# Patient Record
Sex: Female | Born: 1935 | Race: White | Hispanic: No | Smoking: Former smoker
Health system: Southern US, Community
[De-identification: ages and names within clinical notes are randomized; demographics above are authoritative.]

## PROBLEM LIST (undated history)

## (undated) DIAGNOSIS — H9312 Tinnitus, left ear: Secondary | ICD-10-CM

## (undated) DIAGNOSIS — Z7901 Long term (current) use of anticoagulants: Secondary | ICD-10-CM

## (undated) DIAGNOSIS — R55 Syncope and collapse: Secondary | ICD-10-CM

## (undated) DIAGNOSIS — D649 Anemia, unspecified: Secondary | ICD-10-CM

## (undated) DIAGNOSIS — H8110 Benign paroxysmal vertigo, unspecified ear: Secondary | ICD-10-CM

## (undated) DIAGNOSIS — I480 Paroxysmal atrial fibrillation: Secondary | ICD-10-CM

## (undated) DIAGNOSIS — F329 Major depressive disorder, single episode, unspecified: Secondary | ICD-10-CM

## (undated) DIAGNOSIS — I10 Essential (primary) hypertension: Secondary | ICD-10-CM

## (undated) DIAGNOSIS — E119 Type 2 diabetes mellitus without complications: Secondary | ICD-10-CM

## (undated) DIAGNOSIS — Z9289 Personal history of other medical treatment: Secondary | ICD-10-CM

## (undated) DIAGNOSIS — J449 Chronic obstructive pulmonary disease, unspecified: Secondary | ICD-10-CM

## (undated) DIAGNOSIS — IMO0001 Reserved for inherently not codable concepts without codable children: Secondary | ICD-10-CM

## (undated) DIAGNOSIS — F411 Generalized anxiety disorder: Secondary | ICD-10-CM

## (undated) DIAGNOSIS — E7800 Pure hypercholesterolemia, unspecified: Secondary | ICD-10-CM

## (undated) DIAGNOSIS — R0609 Other forms of dyspnea: Secondary | ICD-10-CM

## (undated) DIAGNOSIS — R0600 Dyspnea, unspecified: Secondary | ICD-10-CM

## (undated) DIAGNOSIS — K5900 Constipation, unspecified: Secondary | ICD-10-CM

## (undated) DIAGNOSIS — M1990 Unspecified osteoarthritis, unspecified site: Secondary | ICD-10-CM

## (undated) DIAGNOSIS — R32 Unspecified urinary incontinence: Secondary | ICD-10-CM

## (undated) DIAGNOSIS — F32A Depression, unspecified: Secondary | ICD-10-CM

## (undated) DIAGNOSIS — G4700 Insomnia, unspecified: Secondary | ICD-10-CM

## (undated) DIAGNOSIS — F4381 Prolonged grief disorder: Secondary | ICD-10-CM

## (undated) DIAGNOSIS — F4329 Adjustment disorder with other symptoms: Secondary | ICD-10-CM

## (undated) HISTORY — DX: Major depressive disorder, single episode, unspecified: F32.9

## (undated) HISTORY — DX: Essential (primary) hypertension: I10

## (undated) HISTORY — DX: Generalized anxiety disorder: F41.1

## (undated) HISTORY — DX: Unspecified osteoarthritis, unspecified site: M19.90

## (undated) HISTORY — DX: Pure hypercholesterolemia, unspecified: E78.00

## (undated) HISTORY — DX: Prolonged grief disorder: F43.81

## (undated) HISTORY — DX: Adjustment disorder with other symptoms: F43.29

## (undated) HISTORY — DX: Tinnitus, left ear: H93.12

## (undated) HISTORY — DX: Depression, unspecified: F32.A

## (undated) HISTORY — DX: Long term (current) use of anticoagulants: Z79.01

## (undated) HISTORY — DX: Insomnia, unspecified: G47.00

## (undated) HISTORY — DX: Constipation, unspecified: K59.00

## (undated) HISTORY — DX: Benign paroxysmal vertigo, unspecified ear: H81.10

---
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
## 1972-04-23 HISTORY — PX: TUBAL LIGATION: SHX77

## 1982-04-23 HISTORY — PX: CHOLECYSTECTOMY: SHX55

## 1998-07-14 ENCOUNTER — Other Ambulatory Visit: Admission: RE | Admit: 1998-07-14 | Discharge: 1998-07-14 | Payer: Self-pay | Admitting: Gynecology

## 1999-11-21 ENCOUNTER — Encounter (INDEPENDENT_AMBULATORY_CARE_PROVIDER_SITE_OTHER): Payer: Self-pay

## 1999-11-21 ENCOUNTER — Ambulatory Visit (HOSPITAL_COMMUNITY): Admission: RE | Admit: 1999-11-21 | Discharge: 1999-11-21 | Payer: Self-pay | Admitting: Gynecology

## 1999-11-21 HISTORY — PX: POLYPECTOMY: SHX149

## 2000-08-06 ENCOUNTER — Other Ambulatory Visit: Admission: RE | Admit: 2000-08-06 | Discharge: 2000-08-06 | Payer: Self-pay | Admitting: Gynecology

## 2001-08-18 ENCOUNTER — Other Ambulatory Visit: Admission: RE | Admit: 2001-08-18 | Discharge: 2001-08-18 | Payer: Self-pay | Admitting: Gynecology

## 2002-09-10 ENCOUNTER — Other Ambulatory Visit: Admission: RE | Admit: 2002-09-10 | Discharge: 2002-09-10 | Payer: Self-pay | Admitting: Gynecology

## 2003-04-27 ENCOUNTER — Ambulatory Visit (HOSPITAL_COMMUNITY): Admission: RE | Admit: 2003-04-27 | Discharge: 2003-04-27 | Payer: Self-pay | Admitting: Gastroenterology

## 2003-04-27 ENCOUNTER — Encounter (INDEPENDENT_AMBULATORY_CARE_PROVIDER_SITE_OTHER): Payer: Self-pay | Admitting: *Deleted

## 2003-09-29 ENCOUNTER — Other Ambulatory Visit: Admission: RE | Admit: 2003-09-29 | Discharge: 2003-09-29 | Payer: Self-pay | Admitting: Gynecology

## 2004-03-22 ENCOUNTER — Ambulatory Visit (HOSPITAL_COMMUNITY): Admission: RE | Admit: 2004-03-22 | Discharge: 2004-03-22 | Payer: Self-pay | Admitting: Neurosurgery

## 2004-10-06 ENCOUNTER — Ambulatory Visit (HOSPITAL_COMMUNITY): Admission: RE | Admit: 2004-10-06 | Discharge: 2004-10-06 | Payer: Self-pay | Admitting: Internal Medicine

## 2004-10-16 ENCOUNTER — Other Ambulatory Visit: Admission: RE | Admit: 2004-10-16 | Discharge: 2004-10-16 | Payer: Self-pay | Admitting: Gynecology

## 2004-11-21 HISTORY — PX: EYE SURGERY: SHX253

## 2005-11-20 ENCOUNTER — Other Ambulatory Visit: Admission: RE | Admit: 2005-11-20 | Discharge: 2005-11-20 | Payer: Self-pay | Admitting: Gynecology

## 2006-03-23 HISTORY — PX: EYE SURGERY: SHX253

## 2007-01-21 ENCOUNTER — Other Ambulatory Visit: Admission: RE | Admit: 2007-01-21 | Discharge: 2007-01-21 | Payer: Self-pay | Admitting: Gynecology

## 2009-04-01 ENCOUNTER — Emergency Department (HOSPITAL_COMMUNITY): Admission: EM | Admit: 2009-04-01 | Discharge: 2009-04-02 | Payer: Self-pay | Admitting: Emergency Medicine

## 2009-04-21 ENCOUNTER — Encounter: Admission: RE | Admit: 2009-04-21 | Discharge: 2009-04-21 | Payer: Self-pay | Admitting: Specialist

## 2009-04-23 HISTORY — PX: CERVICAL DISC SURGERY: SHX588

## 2009-04-28 ENCOUNTER — Inpatient Hospital Stay (HOSPITAL_COMMUNITY)
Admission: RE | Admit: 2009-04-28 | Discharge: 2009-04-29 | Payer: Self-pay | Source: Home / Self Care | Admitting: Specialist

## 2009-07-04 ENCOUNTER — Ambulatory Visit: Admission: RE | Admit: 2009-07-04 | Discharge: 2009-07-04 | Payer: Self-pay | Admitting: Internal Medicine

## 2009-07-04 ENCOUNTER — Encounter: Payer: Self-pay | Admitting: Internal Medicine

## 2009-07-07 ENCOUNTER — Emergency Department (HOSPITAL_COMMUNITY): Admission: EM | Admit: 2009-07-07 | Discharge: 2009-07-07 | Payer: Self-pay | Admitting: Emergency Medicine

## 2009-07-25 DIAGNOSIS — F411 Generalized anxiety disorder: Secondary | ICD-10-CM

## 2009-07-25 DIAGNOSIS — R0602 Shortness of breath: Secondary | ICD-10-CM

## 2009-07-25 DIAGNOSIS — I4891 Unspecified atrial fibrillation: Secondary | ICD-10-CM | POA: Insufficient documentation

## 2009-07-25 DIAGNOSIS — I10 Essential (primary) hypertension: Secondary | ICD-10-CM | POA: Insufficient documentation

## 2009-07-25 DIAGNOSIS — E119 Type 2 diabetes mellitus without complications: Secondary | ICD-10-CM

## 2009-07-26 ENCOUNTER — Ambulatory Visit: Payer: Self-pay | Admitting: Internal Medicine

## 2009-07-26 DIAGNOSIS — J309 Allergic rhinitis, unspecified: Secondary | ICD-10-CM | POA: Insufficient documentation

## 2009-07-27 LAB — CONVERTED CEMR LAB
BUN: 12 mg/dL (ref 6–23)
Basophils Absolute: 0.2 10*3/uL — ABNORMAL HIGH (ref 0.0–0.1)
Basophils Relative: 1.8 % (ref 0.0–3.0)
CO2: 30 meq/L (ref 19–32)
Calcium: 10.2 mg/dL (ref 8.4–10.5)
Chloride: 103 meq/L (ref 96–112)
Creatinine, Ser: 0.8 mg/dL (ref 0.4–1.2)
Eosinophils Absolute: 0.2 10*3/uL (ref 0.0–0.7)
Eosinophils Relative: 1.8 % (ref 0.0–5.0)
Glucose, Bld: 150 mg/dL — ABNORMAL HIGH (ref 70–99)
HCT: 40.7 % (ref 36.0–46.0)
Hemoglobin: 13.8 g/dL (ref 12.0–15.0)
Lymphocytes Relative: 20 % (ref 12.0–46.0)
Lymphs Abs: 1.8 10*3/uL (ref 0.7–4.0)
MCHC: 33.8 g/dL (ref 30.0–36.0)
MCV: 90.9 fL (ref 78.0–100.0)
Monocytes Relative: 8.8 % (ref 3.0–12.0)
Neutrophils Relative %: 67.6 % (ref 43.0–77.0)
Platelets: 289 10*3/uL (ref 150.0–400.0)
Pro B Natriuretic peptide (BNP): 117 pg/mL — ABNORMAL HIGH (ref 0.0–100.0)
RBC: 4.48 M/uL (ref 3.87–5.11)
RDW: 12.3 % (ref 11.5–14.6)
Sed Rate: 17 mm/hr (ref 0–22)
Sodium: 142 meq/L (ref 135–145)
TSH: 1.31 microintl units/mL (ref 0.35–5.50)

## 2009-08-11 ENCOUNTER — Ambulatory Visit: Payer: Self-pay | Admitting: Internal Medicine

## 2010-05-14 ENCOUNTER — Encounter: Payer: Self-pay | Admitting: Internal Medicine

## 2010-05-23 NOTE — Assessment & Plan Note (Signed)
Summary: Pulmonary/ final summary f/u ov   Copy to:  Dr. Murray Hodgkins Primary Provider/Referring Provider:  Dr. Murray Hodgkins   CC:  2 wk followup.  Pt states that her breathing has improved 100%.  No complaints today.Erin Vang  History of Present Illness: 75 yowf quit smoking 1988 with no respiratory symptoms.  July 26, 2009 cc sob x sev months doe gradually seemed to happen after neck surgery by Elliot Gurney  Jan 2011 noticed got back on her feet could no longer walk at  [employer] and now using handicap parking. mild dry cough, mild hoarseness. PFT's showed minimal copd   Imp was uacs, rec  Start Dexilant 60 mg Take  one 30-60 min before first meal of the day and  stop all oil based vitamins and do paced excercise.   August 11, 2009 2 wk followup.  Pt states that her breathing has improved 100%.  No complaints today. Pt also denies any obvious fluctuation in symptoms with weather or environmental change or other alleviating or aggravating factors.       Current Medications (verified): 1)  Lanoxin 0.125 Mg Tabs (Digoxin) .Erin Vang.. 1 Every Am 2)  Metoprolol Tartrate 50 Mg Tabs (Metoprolol Tartrate) .Erin Vang.. 1 Two Times A Day 3)  Metformin Hcl 500 Mg Tabs (Metformin Hcl) .Erin Vang.. 1 Two Times A Day 4)  Glimepiride 1 Mg Tabs (Glimepiride) .Erin Vang.. 1 Once Daily 5)  Aspirin 81 Mg Tbec (Aspirin) .Erin Vang.. 1 Once Daily 6)  Multivitamins  Tabs (Multiple Vitamin) .Erin Vang.. 1 Once Daily 7)  Caltrate 600+d Plus 600-400 Mg-Unit Tabs (Calcium Carbonate-Vit D-Min) .Erin Vang.. 1 Two Times A Day 8)  Glucosamine 500 Mg Caps (Glucosamine Sulfate) .... 3 Once Daily 9)  Vitamin C 1000 Mg Tabs (Ascorbic Acid) .Erin Vang.. 1 Once Daily 10)  Xanax 0.5 Mg Tabs (Alprazolam) .Erin Vang.. 1 Once Daily As Needed  Allergies (verified): 1)  ! Erythromycin 2)  ! Macrobid 3)  ! Neosporin 4)  ! Sulfa  Past History:  Past Medical History: DIABETES MELLITUS, TYPE II (ICD-250.00) ANXIETY (ICD-300.00) ATRIAL FIBRILLATION (ICD-427.31) DYSPNEA (ICD-786.05)     - PFT's 07/04/09  FEV1 2.25 (70%) ratio 70 with 10% improvement and DLCO 46 > 64%     - No desat walking x 2 laps @  185 ft each July 26, 2009  Allergic Rhinitis  Vital Signs:  Patient profile:   75 year old female Weight:      149 pounds O2 Sat:      98 % on Room air Temp:     97.9 degrees F oral Pulse rate:   74 / minute BP sitting:   140 / 82  (left arm)  Vitals Entered By: Vernie Murders (August 11, 2009 11:49 AM)  O2 Flow:  Room air  Physical Exam  Additional Exam:   Pleasant amb wf nad with classic voice fatigue > resolved completely HEENT: upper dentures, turbinates, and orophanx. Nl external ear canals without cough reflex NECK :  without JVD/Nodes/TM/ nl carotid upstrokes bilaterally LUNGS: no acc muscle use, clear to A and P bilaterally without cough on insp or exp maneuvers CV:  RRR  no s3 or murmur or increase in P2, no edema  ABD:  soft and nontender with nl excursion in the supine position. No bruits or organomegaly, bowel sounds nl MS:  warm without deformities, calf tenderness, cyanosis or clubbing      Impression & Recommendations:  Problem # 1:  DYSPNEA (ICD-786.05)  When respiratory symptoms begin well after a  patient reports complete smoking cessation,  it is very hard to "blame" COPD  ie it doesn't make any more sense than hearing a  NASCAR driver wrecked his car while driving his kids to school or a Careers adviser sliced his hand off carving [place].  Once the high risk activity stops,  the symptoms should not suddenly erupt.  If so, the differential diagnosis should include  obesity/deconditioning,  LPR/Reflux, CHF, or side effect of medications   In her case the problem was probably a combination of LPR/gerd and deconditioning.  Ok to try to taper PPI as long as follows diet.  NB the  ramp to expected improvement (and for that matter, worsening, if a chronic effective medication is stopped)  can be measured in weeks, not days, a common misconception because this is not Heartburn  with no immediate cause and effect relationship so that response to therapy or lack thereof can be very difficult to assess.   Each maintenance medication was reviewed in detail including most importantly the difference between maintenance prns and under what circumstances the prns are to be used.  In addition, these two groups (for which the patient should keep up with refills) were distinguished from a third group :  meds that are used only short term with the intent to complete a course of therapy and then not refill them.  The med list was then fully reconciled and reorganized to reflect this important distinction.   Other Orders: Est. Patient Level IV (16109)  Patient Instructions: 1)  ok to try off dexilant as long as you follow the diet and if worsen will need to restart dexilant or try prilosec otc 20 mg take Take  two  30-60 min before first meal of the day

## 2010-05-23 NOTE — Assessment & Plan Note (Signed)
Summary: Pulmonary/ new pt eval for doe   Visit Type:  Initial Consult Copy to:  Dr. Murray Hodgkins Primary Provider/Referring Provider:  Dr. Murray Hodgkins   CC:  Dyspnea.  History of Present Illness: 74 yowf quit smoking 1988 with no respiratory symptoms.  July 26, 2009 cc sob x sev months doe gradually seemed to happen after neck surgery by Elliot Gurney  Jan 2011 noticed got back on her feet could no longer walk at  [employer] and now using handicap parking. mild dry cough, mild hoarseness.  Pt denies any significant sore throat, dysphagia, itching, sneezing,  nasal congestion or excess secretions,  fever, chills, sweats, unintended wt loss, pleuritic or exertional cp, hempoptysis, change in activity tolerance  orthopnea pnd or leg swelling. Pt also denies any obvious fluctuation in symptoms with weather or environmental change or other alleviating or aggravating factors.       Current Medications (verified): 1)  Lanoxin 0.125 Mg Tabs (Digoxin) .Marland Kitchen.. 1 Every Am 2)  Metoprolol Tartrate 50 Mg Tabs (Metoprolol Tartrate) .Marland Kitchen.. 1 Two Times A Day 3)  Metformin Hcl 500 Mg Tabs (Metformin Hcl) .Marland Kitchen.. 1 Two Times A Day 4)  Glimepiride 1 Mg Tabs (Glimepiride) .Marland Kitchen.. 1 Once Daily 5)  Aspirin 81 Mg Tbec (Aspirin) .Marland Kitchen.. 1 Once Daily 6)  Multivitamins  Tabs (Multiple Vitamin) .Marland Kitchen.. 1 Once Daily 7)  Caltrate 600+d Plus 600-400 Mg-Unit Tabs (Calcium Carbonate-Vit D-Min) .Marland Kitchen.. 1 Two Times A Day 8)  Vitamin D3 2000 Unit Caps (Cholecalciferol) .Marland Kitchen.. 1 Once Daily 9)  Fish Oil Maximum Strength 1200 Mg Caps (Omega-3 Fatty Acids) .Marland Kitchen.. 1 Once Daily 10)  Flax Seed Oil 1000 Mg Caps (Flaxseed (Linseed)) .Marland Kitchen.. 1 Once Daily 11)  Glucosamine 500 Mg Caps (Glucosamine Sulfate) .... 3 Once Daily 12)  Vitamin C 1000 Mg Tabs (Ascorbic Acid) .Marland Kitchen.. 1 Once Daily 13)  Xanax 0.5 Mg Tabs (Alprazolam) .Marland Kitchen.. 1 Once Daily As Needed  Allergies (verified): 1)  ! Erythromycin 2)  ! Macrobid 3)  ! Neosporin 4)  ! Sulfa  Past History:  Past  Medical History: DIABETES MELLITUS, TYPE II (ICD-250.00) ANXIETY (ICD-300.00) ATRIAL FIBRILLATION (ICD-427.31) DYSPNEA (ICD-786.05)     - PFT's 07/04/09 FEV1 2.25 (70%) ratio 70 with 10% improvement and DLCO 46 > 64%     - No desat walking x 2 laps @ 185 ft each July 26, 2009  Allergic Rhinitis  Family History: Lung CA- Father (was a smoker)  Social History: Married Children Former smoker.  Quit in 1988.  Smoked for 35 yrs up to 1 1/2 ppd No ETOH Retired  Review of Systems       The patient complains of shortness of breath with activity, non-productive cough, loss of appetite, and weight change.  The patient denies shortness of breath at rest, productive cough, coughing up blood, chest pain, irregular heartbeats, acid heartburn, indigestion, abdominal pain, difficulty swallowing, sore throat, tooth/dental problems, headaches, nasal congestion/difficulty breathing through nose, sneezing, itching, ear ache, anxiety, depression, hand/feet swelling, joint stiffness or pain, rash, change in color of mucus, and fever.    Vital Signs:  Patient profile:   75 year old female Height:      64 inches Weight:      127 pounds BMI:     21.88 O2 Sat:      97 % on Room air Temp:     98.3 degrees F oral Pulse rate:   74 / minute BP sitting:   122 / 60  (left arm)  Vitals Entered By: Vernie Murders (July 26, 2009 10:04 AM)  O2 Flow:  Room air  Serial Vital Signs/Assessments:  Comments: 10:32 AM Ambulatory Pulse Oximetry  Resting; HR__66___    02 Sat__97%ra___  Lap1 (185 feet)   HR__75___   02 Sat__97%ra___ Lap2 (185 feet)   HR__75___   02 Sat__97%ra___    Lap3 (185 feet)   HR_____   02 Sat_____  ___Test Completed without Difficulty _x__Test Stopped due to:pt c/o SOB   By: Vernie Murders    Physical Exam  Additional Exam:  wt 127 July 26, 2009 Pleasant amb wf nad with classic voice fatigue HEENT: upper dentures, turbinates, and orophanx. Nl external ear canals without cough  reflex NECK :  without JVD/Nodes/TM/ nl carotid upstrokes bilaterally LUNGS: no acc muscle use, clear to A and P bilaterally without cough on insp or exp maneuvers CV:  RRR  no s3 or murmur or increase in P2, no edema  ABD:  soft and nontender with nl excursion in the supine position. No bruits or organomegaly, bowel sounds nl MS:  warm without deformities, calf tenderness, cyanosis or clubbing SKIN: warm and dry without lesions   NEURO:  alert, approp, no deficits     CT of Chest  Procedure date:  07/04/2009  Findings:      biapical densities c/w scarring no change since 04/29/09 and no active or acute cardiopulmonary abns  Impression & Recommendations:  Problem # 1:  DYSPNEA (ICD-786.05) When respiratory symptoms begin well after a patient reports complete smoking cessation,  it is very hard to "blame" COPD  ie it doesn't make any more sense than hearing a  NASCAR driver wrecked his car while driving his kids to school or a Careers adviser sliced his hand off carving [place].  Once the high risk activity stops,  the symptoms should not suddenly erupt.  If so, the differential diagnosis should include  obesity/deconditioning,  LPR/Reflux, CHF, or side effect of medications.  Not on ace and only moderate doses of metaprolol but coulld have occult LPR aggravated by ET placement for surgery explaining her cough,voice fatigue and atypical sob.  try diet and dexilant and reconditioning and off all oil based vitamins empirically in case she has non-acid related occult gerd  NB the  ramp to expected improvement (and for that matter, worsening, if a chronic effective medication is stopped)  can be measured in weeks, not days, a common misconception because this is not Heartburn with no immediate cause and effect relationship so that response to therapy or lack thereof can be very difficult to assess.   See instructions for specific recommendations    Medications Added to Medication List This Visit: 1)   Lanoxin 0.125 Mg Tabs (Digoxin) .Marland Kitchen.. 1 every am 2)  Metoprolol Tartrate 50 Mg Tabs (Metoprolol tartrate) .Marland Kitchen.. 1 two times a day 3)  Metformin Hcl 500 Mg Tabs (Metformin hcl) .Marland Kitchen.. 1 two times a day 4)  Glimepiride 1 Mg Tabs (Glimepiride) .Marland Kitchen.. 1 once daily 5)  Aspirin 81 Mg Tbec (Aspirin) .Marland Kitchen.. 1 once daily 6)  Multivitamins Tabs (Multiple vitamin) .Marland Kitchen.. 1 once daily 7)  Caltrate 600+d Plus 600-400 Mg-unit Tabs (Calcium carbonate-vit d-min) .Marland Kitchen.. 1 two times a day 8)  Vitamin D3 2000 Unit Caps (Cholecalciferol) .Marland Kitchen.. 1 once daily 9)  Fish Oil Maximum Strength 1200 Mg Caps (Omega-3 fatty acids) .Marland Kitchen.. 1 once daily 10)  Flax Seed Oil 1000 Mg Caps (Flaxseed (linseed)) .Marland Kitchen.. 1 once daily 11)  Glucosamine 500 Mg Caps (  Glucosamine sulfate) .... 3 once daily 12)  Vitamin C 1000 Mg Tabs (Ascorbic acid) .Marland Kitchen.. 1 once daily 13)  Xanax 0.5 Mg Tabs (Alprazolam) .Marland Kitchen.. 1 once daily as needed 14)  Dexilant 60 Mg Cpdr (Dexlansoprazole) .... Take  one 30-60 min before first meal of the day 15)  Dexilant 60 Mg Cpdr (Dexlansoprazole) .... Take  one 30-60 min before first meal of the day  Other Orders: TLB-BMP (Basic Metabolic Panel-BMET) (80048-METABOL) TLB-CBC Platelet - w/Differential (85025-CBCD) TLB-TSH (Thyroid Stimulating Hormone) (84443-TSH) TLB-BNP (B-Natriuretic Peptide) (83880-BNPR) TLB-Sedimentation Rate (ESR) (85652-ESR) New Patient Level V (16109)  Patient Instructions: 1)  Pace yourself on a flat surface and try to build up to 20 - 30 min daily never to point of being out of breath 2)  Start Dexilant 60 mg Take  one 30-60 min before first meal of the day and  stop all oil based vitamins 3)  GERD (REFLUX)  is a common cause of respiratory symptoms. It commonly presents without heartburn and can be treated with medication, but also with lifestyle changes including avoidance of late meals, excessive alcohol, smoking cessation, and avoid fatty foods, chocolate, peppermint, colas, red wine, and acidic juices  such as orange juice. NO MINT OR MENTHOL PRODUCTS SO NO COUGH DROPS  4)  USE SUGARLESS CANDY INSTEAD (jolley ranchers)  5)  ADD:  NEEDS 2 week f/u ov

## 2010-07-04 ENCOUNTER — Ambulatory Visit: Payer: Self-pay | Admitting: Cardiology

## 2010-07-06 ENCOUNTER — Ambulatory Visit (INDEPENDENT_AMBULATORY_CARE_PROVIDER_SITE_OTHER): Payer: Medicare Other | Admitting: Cardiology

## 2010-07-06 DIAGNOSIS — R0602 Shortness of breath: Secondary | ICD-10-CM

## 2010-07-06 DIAGNOSIS — I119 Hypertensive heart disease without heart failure: Secondary | ICD-10-CM

## 2010-07-06 DIAGNOSIS — Z79899 Other long term (current) drug therapy: Secondary | ICD-10-CM

## 2010-07-09 LAB — CBC
HCT: 41.6 % (ref 36.0–46.0)
Hemoglobin: 14.4 g/dL (ref 12.0–15.0)
MCHC: 34.6 g/dL (ref 30.0–36.0)
MCV: 93.4 fL (ref 78.0–100.0)
Platelets: 219 10*3/uL (ref 150–400)
RBC: 4.45 MIL/uL (ref 3.87–5.11)
RDW: 13.5 % (ref 11.5–15.5)
WBC: 8.2 10*3/uL (ref 4.0–10.5)

## 2010-07-09 LAB — DIFFERENTIAL
Basophils Absolute: 0.1 10*3/uL (ref 0.0–0.1)
Basophils Relative: 2 % — ABNORMAL HIGH (ref 0–1)
Eosinophils Absolute: 0.1 10*3/uL (ref 0.0–0.7)
Eosinophils Relative: 1 % (ref 0–5)
Lymphocytes Relative: 27 % (ref 12–46)
Lymphs Abs: 2.2 10*3/uL (ref 0.7–4.0)
Monocytes Absolute: 0.9 10*3/uL (ref 0.1–1.0)
Monocytes Relative: 11 % (ref 3–12)
Neutro Abs: 4.9 10*3/uL (ref 1.7–7.7)
Neutrophils Relative %: 60 % (ref 43–77)

## 2010-07-09 LAB — COMPREHENSIVE METABOLIC PANEL
ALT: 30 U/L (ref 0–35)
AST: 36 U/L (ref 0–37)
Albumin: 3.7 g/dL (ref 3.5–5.2)
Alkaline Phosphatase: 57 U/L (ref 39–117)
BUN: 12 mg/dL (ref 6–23)
CO2: 25 mEq/L (ref 19–32)
Calcium: 9.1 mg/dL (ref 8.4–10.5)
Chloride: 104 mEq/L (ref 96–112)
Creatinine, Ser: 0.97 mg/dL (ref 0.4–1.2)
GFR calc Af Amer: >60 mL/min (ref >60)
GFR calc non Af Amer: 57 mL/min — ABNORMAL LOW (ref >60)
Glucose, Bld: 153 mg/dL — ABNORMAL HIGH (ref 70–99)
Potassium: 4.1 mEq/L (ref 3.5–5.1)
Sodium: 139 mEq/L (ref 135–145)
Total Bilirubin: 0.5 mg/dL (ref 0.3–1.2)
Total Protein: 6.4 g/dL (ref 6.0–8.3)

## 2010-07-09 LAB — GLUCOSE, CAPILLARY
Glucose-Capillary: 118 mg/dL — ABNORMAL HIGH (ref 70–99)
Glucose-Capillary: 137 mg/dL — ABNORMAL HIGH (ref 70–99)
Glucose-Capillary: 139 mg/dL — ABNORMAL HIGH (ref 70–99)
Glucose-Capillary: 93 mg/dL (ref 70–99)

## 2010-07-09 LAB — APTT: aPTT: 25 seconds (ref 24–37)

## 2010-07-09 LAB — HEMOGLOBIN A1C
Hgb A1c MFr Bld: 7.5 % — ABNORMAL HIGH (ref 4.6–6.1)
Mean Plasma Glucose: 169 mg/dL

## 2010-07-09 LAB — PROTIME-INR
INR: 1.02 (ref 0.00–1.49)
Prothrombin Time: 13.3 seconds (ref 11.6–15.2)

## 2010-07-14 ENCOUNTER — Telehealth: Payer: Self-pay | Admitting: *Deleted

## 2010-07-14 DIAGNOSIS — R609 Edema, unspecified: Secondary | ICD-10-CM

## 2010-07-14 LAB — BLOOD GAS, ARTERIAL
Acid-Base Excess: 0.9 mmol/L (ref 0.0–2.0)
Bicarbonate: 24.2 meq/L — ABNORMAL HIGH (ref 20.0–24.0)
Drawn by: 246101
FIO2: 0.21 %
O2 Saturation: 96.2 %
Patient temperature: 98.6
TCO2: 21.5 mmol/L (ref 0–100)
pCO2 arterial: 36 mmHg (ref 35.0–45.0)
pH, Arterial: 7.442 — ABNORMAL HIGH (ref 7.350–7.400)
pO2, Arterial: 77.8 mmHg — ABNORMAL LOW (ref 80.0–100.0)

## 2010-07-14 MED ORDER — FUROSEMIDE 40 MG PO TABS: ORAL_TABLET | ORAL | Status: DC

## 2010-07-14 NOTE — Telephone Encounter (Signed)
Stop Losartan and start lasix 40 1-2 tabs daily prn

## 2010-07-14 NOTE — Telephone Encounter (Signed)
Started swelling in legs and even in hands, unable to get rings on.  Swelling started yesterday, did go down last night.  Has been sitting with feet up.  Husband in hospital.  Started losartan 100 mg on 07/07/10, coming from that.  No new shortness of breath.  Denies any increase salt intake.  Please advise.

## 2010-07-14 NOTE — Telephone Encounter (Signed)
Message copied by Regis Bill on Fri Jul 14, 2010 10:35 AM ------      Message from: Sheffield Slider      Created: Fri Jul 14, 2010  9:29 AM      Regarding: SWELLING      Contact: [phone]       PT HAS HAD SOME RECENT SWELLING, PLEASE CALL ABOVE # LISTED. CHART IN BOX.  929AM

## 2010-07-14 NOTE — Telephone Encounter (Signed)
Advised pt to stop losartan and start lasix prn.  Advised to call back next week with update on how she is doing

## 2010-07-20 ENCOUNTER — Other Ambulatory Visit (INDEPENDENT_AMBULATORY_CARE_PROVIDER_SITE_OTHER): Payer: Medicare Other | Admitting: *Deleted

## 2010-07-20 DIAGNOSIS — Z79899 Other long term (current) drug therapy: Secondary | ICD-10-CM

## 2010-07-20 LAB — BASIC METABOLIC PANEL
BUN: 14 mg/dL (ref 6–23)
Calcium: 9.9 mg/dL (ref 8.4–10.5)
GFR: 75.82 mL/min (ref >60.00)
Glucose, Bld: 144 mg/dL — ABNORMAL HIGH (ref 70–99)
Potassium: 4.1 mEq/L (ref 3.5–5.1)
Sodium: 144 mEq/L (ref 135–145)

## 2010-07-26 ENCOUNTER — Telehealth: Payer: Self-pay | Admitting: *Deleted

## 2010-07-26 NOTE — Telephone Encounter (Signed)
Message copied by Regis Bill on Wed Jul 26, 2010 10:35 AM ------      Message from: Cassell Clement      Created: Thu Jul 20, 2010  5:04 PM       Blood work is satisfactory.  Continue present medications.

## 2010-07-26 NOTE — Telephone Encounter (Signed)
Advised patient of labs 

## 2010-08-24 ENCOUNTER — Encounter: Payer: Self-pay | Admitting: Internal Medicine

## 2010-09-08 ENCOUNTER — Encounter: Payer: Self-pay | Admitting: *Deleted

## 2010-09-08 DIAGNOSIS — E785 Hyperlipidemia, unspecified: Secondary | ICD-10-CM | POA: Insufficient documentation

## 2010-09-08 DIAGNOSIS — M1990 Unspecified osteoarthritis, unspecified site: Secondary | ICD-10-CM | POA: Insufficient documentation

## 2010-09-08 NOTE — Op Note (Signed)
[facility]  Patient:    Erin Vang                         MRN: [id number] Proc. Date: 11/21/99 Adm. Date:  [id number] Attending:  Katrina Stack CC:         Lenon Curt. Cassell Clement, M.D.  Gretta Cool, M.D.   Operative Report  PREOPERATIVE DIAGNOSES: 1. Endometrial polyps with abnormal uterine bleeding, recurrent, no hormone    replacement therapy. 2. Abnormal saline infusion sonography with polyps detected.  POSTOPERATIVE DIAGNOSES: 1. Endometrial polyps with abnormal uterine bleeding, recurrent, no hormone    replacement therapy. 2. Abnormal saline infusion sonography with polyps detected.  PROCEDURE:  Hysteroscopy, resection of endometrial polyps x 2, endometrial ablation by resection plus VaporTrode.  SURGEON:  Gretta Cool, M.D.  ANESTHESIA:  IV sedation plus paracervical block.  DESCRIPTION OF PROCEDURE:  Under excellent anesthesia as above with the patient prepped and draped in Allen stirrups, the cervix was progressively dilated with a series of Pratt dilators to accommodate a 7 mm resectoscope. The resectoscope was then introduced and the cavity photographed.  Two polyps were identified, one mid- to anterior uterine wall and another in the right cornual area.  The right cornual area was extremely vascular.  There was no evidence of hyperplasia or suspicion of malignancy.  At this point, the polyps were resected individually.  The entire endometrial cavity was then resected, extending approximately 3 mm down into myometrial tissues.  Once the entire endometrial cavity had been resected 360 degrees, VaporTrode was applied, and the entire endometrial cavity treated by VaporTrode so as to eliminate any islands of viable surface endometrium.  At this point, the pressure was reduced and the cavity again examined.  Significant bleeding points were treated again by VaporTrode on the coagulation mode.  At this point, the procedure  was terminated without complication.  The patient returned to the recovery room in excellent condition.  The fluid deficit was 80 cc. DD:  11/21/99 TD:  11/21/99 Job: [id number]

## 2010-09-08 NOTE — Op Note (Signed)
NAME:  Erin Vang, Erin Vang                           ACCOUNT NO.:  [id number]   MEDICAL RECORD NO.:  [id number]                   PATIENT TYPE:  AMB   LOCATION:  ENDO                                 [facility]ACILITY[facility]  [facility]   PHYSICIAN:  Petra Kuba, M.D.                 DATE OF BIRTH:  [birth year]   DATE OF PROCEDURE:  04/27/2003  DATE OF DISCHARGE:                                 OPERATIVE REPORT   PROCEDURE:  Colonoscopy with biopsy.   INDICATIONS FOR PROCEDURE:  Change in bowel habits, increasing diarrhea  in  a patient due for colonic screening. Consent was signed  after the risks and  benefits, methods and options were thoroughly discussed in the office.   MEDICATIONS:  Demerol 80, Versed 8.   DESCRIPTION OF PROCEDURE:  First the patient was noticed to be in atrial  fibrillation. She really has no heart  or lung history. We did do an EKG  which confirmed the atrial fibrillation. On further  questioning it sounds  like she may have heart  palpitations at night. We went ahead and did an EKG  which did not show any other abnormality and some electrolytes as well as a  CBC which were all normal, and based on her being asymptomatic elected to  proceed. During the procedure interestingly enough  she went back into sinus  rhythm. Her vital signs and breathing were all fine throughout the  procedure.   Rectal inspection was pertinent for small external hemorrhoids. Digital  examination  was negative. The video pediatric adjustable colonoscope was  inserted, and despite her having some pain, was easily advanced  to probably  the mid transverse. Unfortunately to advance any  further caused some  looping and some increased pain. We tried withdrawing and readvancing. We  tried rolling her on her all 3 positions. We tried various abdominal  pressures, but we could not seem to find the loop or decrease her pain when  we advanced so we elected to withdraw. No abnormalities were seen on  withdrawal  other than a tortuous sigmoid. Random scattered biopsies were  obtained to rule out any microscopic colitis.   Once back in the rectum anorectal pullthrough and retroflexion did confirm  some small  hemorrhoids. The scope was drained. The scope was removed. The  patient tolerated the procedure fair. There was no other  obvious  complication.   ENDOSCOPIC DIAGNOSES:  1. Internal and external hemorrhoids.  2. Tortuous sigmoid with probable looping there.  3. Normal to questionably the mid transverse or possibly the hepatic     flexure. Unable to advance secondary to pain and looping with random     biopsies done throughout.   PLAN:  Await pathology. Consider one time virtual colonoscopy or air-  contrast barium enema in the future. Will proceed with repeat EKG now when  she is in sinus rhythm and then further  workup and plans for her atrial  fibrillation first per Dr. [name], probable cardiology consult. We will  forward to Dr. [name] the above EKGs and labs and see her back p.r.n. in 6  weeks to recheck symptoms and decide any other workup and plans or complete  GI workup at that juncture.                                               Petra Kuba, M.D.    MEM/MEDQ  D:  04/27/2003  T:  04/27/2003  Job:  [id number]   cc:   Lenon Curt. [name], M.D.  [address]  [place]  [place] [address]  Fax: [phone]

## 2010-09-11 ENCOUNTER — Encounter: Payer: Self-pay | Admitting: Cardiology

## 2010-09-11 ENCOUNTER — Ambulatory Visit (INDEPENDENT_AMBULATORY_CARE_PROVIDER_SITE_OTHER): Payer: Medicare Other | Admitting: Cardiology

## 2010-09-11 ENCOUNTER — Ambulatory Visit
Admission: RE | Admit: 2010-09-11 | Discharge: 2010-09-11 | Disposition: A | Discharge status: Home / Self Care | Payer: Medicare Other | Source: Ambulatory Visit | Attending: Cardiology | Admitting: Cardiology

## 2010-09-11 VITALS — BP 144/80 | HR 80 | Wt 133.0 lb

## 2010-09-11 DIAGNOSIS — R0609 Other forms of dyspnea: Secondary | ICD-10-CM

## 2010-09-11 DIAGNOSIS — R0600 Dyspnea, unspecified: Secondary | ICD-10-CM

## 2010-09-11 DIAGNOSIS — I119 Hypertensive heart disease without heart failure: Secondary | ICD-10-CM

## 2010-09-11 DIAGNOSIS — I4891 Unspecified atrial fibrillation: Secondary | ICD-10-CM

## 2010-09-11 NOTE — Progress Notes (Signed)
[name] Date of Birth:  Aug 05, 1935 [facility] / [facility] [address]   [address] [place], [place]  [address] [phone]           Fax   [phone]  History of Present Illness: This pleasant 75 year old woman is seen for a scheduled followup office visit.  She has a history of shortness of breath with exertion.  She has not had any wheezing or cough or sputum production.  She's not expressing any chest pain or she states that when she walks her heart speeds up quite rapidly but comes back to normal so she stops and rests.  She does not have known coronary disease.  She has not had cardiac catheterization.  She had a normal stress Cardiolite on 10/27/04.  She had an echocardiogram on 04/27/09 which showed ejection fraction of 55-60% and no wall motion abnormalities.  She was in atrial fibrillation at that time.  She was found to have mild mitral regurgitation and mild aortic sclerosis with trivial aortic insufficiency.    Current Outpatient Prescriptions  Medication Sig Dispense Refill  . ALPRAZolam (XANAX) 0.5 MG tablet Take 0.5 mg by mouth at bedtime as needed.        Marland Kitchen aspirin 81 MG tablet Take 81 mg by mouth daily.        . Calcium Carbonate (CALCIUM 600 PO) Take by mouth 2 (two) times daily.        . Cholecalciferol (VITAMIN D PO) Take 2,000 mg by mouth daily.        . digoxin (LANOXIN) 0.125 MG tablet Take 125 mcg by mouth daily.        . Flaxseed, Linseed, (FLAX SEED OIL) 1000 MG CAPS Take by mouth. Takes 3 daily      . furosemide (LASIX) 40 MG tablet 20 mg daily.        . Glucosamine-Chondroitin (RA GLUCOSAMINE-CHONDROITIN) 750-600 MG TABS Take by mouth daily.        . metFORMIN (GLUCOPHAGE) 500 MG tablet Take 500 mg by mouth 2 (two) times daily with a meal.        . METOPROLOL TARTRATE PO Take 50 mg by mouth. Taking BID       . Multiple Vitamin (MULTIVITAMIN PO) Take by mouth.        . Omega-3 Fatty Acids (FISH OIL) 1200 MG CAPS Take by mouth.        .  simvastatin (ZOCOR) 40 MG tablet Take 40 mg by mouth at bedtime.        . vitamin C (ASCORBIC ACID) 500 MG tablet Take 500 mg by mouth daily.        Marland Kitchen DISCONTD: furosemide (LASIX) 40 MG tablet 1 to 2 daily prn swelling  30 tablet  2    Allergies  Allergen Reactions  . Erythromycin   . Macrobid   . Nitrofurantoin   . Penicillins   . Sulfonamide Derivatives   . Triple Antibiotic     Patient Active Problem List  Diagnoses  . DIABETES MELLITUS, TYPE II  . ANXIETY  . HYPERTENSION  . Atrial fibrillation  . ALLERGIC RHINITIS  . DYSPNEA  . Osteoarthritis  . Hypercholesterolemia  . Personal history of atrial fibrillation  . Dyspnea on exertion  . Benign hypertensive heart disease without heart failure    History  Smoking status  . Former Smoker  Smokeless tobacco  . Not on file    History  Alcohol Use     Family  History  Problem Relation Age of Onset  . Heart failure Mother   . Cancer Father     Review of Systems: Constitutional: no fever chills diaphoresis or fatigue or change in weight.  Head and neck: no hearing loss, no epistaxis, no photophobia or visual disturbance. Respiratory: No cough, shortness of breath or wheezing. Cardiovascular: No chest pain peripheral edema, palpitations. Gastrointestinal: No abdominal distention, no abdominal pain, no change in bowel habits hematochezia or melena. Genitourinary: No dysuria, no frequency, no urgency, no nocturia. Musculoskeletal:No arthralgias, no back pain, no gait disturbance or myalgias. Neurological: No dizziness, no headaches, no numbness, no seizures, no syncope, no weakness, no tremors. Hematologic: No lymphadenopathy, no easy bruising. Psychiatric: No confusion, no hallucinations, no sleep disturbance.    Physical Exam: Filed Vitals:   09/11/10 1345  BP: 144/80  Pulse: 80  The general appearance reveals a well-developed well-nourished woman in no distress.Pupils equal and reactive.   Extraocular  Movements are full.  There is no scleral icterus.  The mouth and pharynx are normal.  The neck is supple.  The carotids reveal no bruits.  The jugular venous pressure is normal.  The thyroid is not enlarged.  There is no lymphadenopathy.The chest is clear to percussion and auscultation. There are no rales or rhonchi. Expansion of the chest is symmetrical.The precordium is quiet.  The first heart sound is normal.  The second heart sound is physiologically split.  There is no murmur gallop rub or click.  There is no abnormal lift or heave.The abdomen is soft and nontender. Bowel sounds are normal. The liver and spleen are not enlarged. There Are no abdominal masses. There are no bruits.The pedal pulses are good.  There is no phlebitis or edema.  There is no cyanosis or clubbing.Strength is normal and symmetrical in all extremities.  There is no lateralizing weakness.  There are no sensory deficits.The skin is warm and dry.  There is no rash.   Assessment / Plan: Clinically there is no evidence of congestive heart failure.  We will get a chest x-ray to rule out other causes of dyspnea.  Recheck 3 month

## 2010-09-11 NOTE — Assessment & Plan Note (Signed)
Patient brought in a list of her blood pressures which generally have been in the 140/70 range.  Occasionally her systolics has been as high as 190.

## 2010-09-11 NOTE — Assessment & Plan Note (Signed)
The patient has a past history of palpitations and PVCs as well as essential hypertension and diabetes.  Recently she's been experiencing exertional dyspnea.  She does not have any known coronary disease..  She has not been experiencing any cough or sputum production.

## 2010-09-11 NOTE — Assessment & Plan Note (Signed)
The patient has not been around any palpitations.  She states that when she walks her heart speeds up and beats for a fast but then slows down as soon as she rests.

## 2010-09-12 ENCOUNTER — Telehealth: Payer: Self-pay | Admitting: *Deleted

## 2010-09-12 DIAGNOSIS — R0602 Shortness of breath: Secondary | ICD-10-CM

## 2010-09-12 NOTE — Telephone Encounter (Signed)
Message copied by Regis Bill on Tue Sep 12, 2010  1:18 PM ------      Message from: Cassell Clement      Created: Mon Sep 11, 2010  9:01 PM       Xray is normal.  Please report.

## 2010-09-12 NOTE — Telephone Encounter (Signed)
Advised of chest xray results.  States she his still getting short  of breath with very little exertion.  Please advise

## 2010-09-12 NOTE — Progress Notes (Signed)
Advised of chest xray 

## 2010-09-12 NOTE — Telephone Encounter (Signed)
Please arrange for a stress myoview.  Also get CBC and BNP

## 2010-09-15 NOTE — Telephone Encounter (Signed)
Labs obtained from Dr Paralee Cancel office, no need for them to be redone per Dr  Patty Sermons.  Scheduled stress cardiolite

## 2010-09-20 ENCOUNTER — Ambulatory Visit (HOSPITAL_COMMUNITY): Payer: Medicare Other | Attending: Cardiology | Admitting: Radiology

## 2010-09-20 VITALS — Ht 65.0 in | Wt 136.0 lb

## 2010-09-20 DIAGNOSIS — R0989 Other specified symptoms and signs involving the circulatory and respiratory systems: Secondary | ICD-10-CM

## 2010-09-20 DIAGNOSIS — I4891 Unspecified atrial fibrillation: Secondary | ICD-10-CM

## 2010-09-20 DIAGNOSIS — R0602 Shortness of breath: Secondary | ICD-10-CM | POA: Insufficient documentation

## 2010-09-20 MED ORDER — REGADENOSON 0.4 MG/5ML IV SOLN
0.4000 mg | Freq: Once | INTRAVENOUS | Status: AC
  Administered 2010-09-20: 0.4 mg via INTRAVENOUS

## 2010-09-20 MED ORDER — TECHNETIUM TC 99M TETROFOSMIN IV KIT
33.0000 | PACK | Freq: Once | INTRAVENOUS | Status: AC | PRN
  Administered 2010-09-20: 33 via INTRAVENOUS

## 2010-09-20 MED ORDER — TECHNETIUM TC 99M TETROFOSMIN IV KIT
11.0000 | PACK | Freq: Once | INTRAVENOUS | Status: AC | PRN
  Administered 2010-09-20: 11 via INTRAVENOUS

## 2010-09-20 NOTE — Progress Notes (Addendum)
[facility] SITE 3 NUCLEAR MED [address] [place] [address] [phone]  Cardiology Nuclear Med Study  Erin Vang is a 75 y.o. female [id number] 14-Jul-1935   Nuclear Med Background Indication for Stress Test:  Evaluation for Ischemia History:1/11  Echo-ef 55-60% and 7/06 Myocardial Perfusion Study-Nml, EF-79% Cardiac Risk Factors: Family History - CAD, History of Smoking, Hypertension, Lipids and NIDDM  Symptoms:  DOE, Palpitations, Rapid HR and SOB   Nuclear Pre-Procedure Caffeine/Decaff Intake:  None NPO After: 7:00pm   Lungs:  Clear IV 0.9% NS with Angio Cath:  20g  IV Site: R Forearm  IV Started by:  Stanton Kidney, EMT-P  Chest Size (in):  36 Cup Size: B  Height: 5\' 5"  (1.651 m)  Weight:  136 lb (61.689 kg)  BMI:  Body mass index is 22.63 kg/(m^2). Tech Comments:  Metoprolol held > 24 hours, per patient.    Nuclear Med Study 1 or 2 day study: 1 day  Stress Test Type:  Stress  Reading MD: Kristeen Miss, MD  Order Authorizing Provider:  Dr. Wylene Simmer  Resting Radionuclide: Technetium 37m Tetrofosmin  Resting Radionuclide Dose: 11 mCi   Stress Radionuclide:  Technetium 70m Tetrofosmin  Stress Radionuclide Dose: 33 mCi           Stress Protocol Rest HR: 61 Stress HR: 113  Rest BP: 131/75 Stress BP: 166/102  Exercise Time (min): 4:45 METS: 4.6   Predicted Max HR: 147 bpm % Max HR: 76.87 bpm Rate Pressure Product: 21308   Dose of Adenosine (mg):  n/a Dose of Lexiscan: 0.4 mg  Dose of Atropine (mg): n/a Dose of Dobutamine: n/a mcg/kg/min (at max HR)  Stress Test Technologist: Bonnita Levan, RN  Nuclear Technologist:  Domenic Polite, CNMT     Rest Procedure:  Myocardial perfusion imaging was performed at rest 45 minutes following the intravenous administration of Technetium 60m Tetrofosmin. Rest ECG: NSR.  There are nonspecific ST changes in the inferior and lateral leads.  Stress Procedure:  The patient exercised for 4:45.  The patient  stopped due to extreme dyspnea and inability to achieve target heart rate and was given Lexiscan. She denied any chest pain.  There were non-specific ST-T wave changes.  Technetium 40m Tetrofosmin was injected at peak exercise and myocardial perfusion imaging was performed after a brief delay. Stress ECG: No significant ST segment change suggestive of ischemia.  QPS Raw Data Images:  Normal; no motion artifact; normal heart/lung ratio. Stress Images:  Normal homogeneous uptake in all areas of the myocardium. Rest Images:  Normal homogeneous uptake in all areas of the myocardium. Subtraction (SDS):  No evidence of ischemia. Transient Ischemic Dilatation (Normal <1.22):  0.87 Lung/Heart Ratio (Normal <0.45):  0.28  Quantitative Gated Spect Images QGS EDV:  59 ml QGS ESV:  16 ml QGS cine images:  NL LV Function; NL Wall Motion QGS EF: 72%  Impression Exercise Capacity:  Lexiscan with low level exercise. BP Response:  Normal blood pressure response. Clinical Symptoms:  No chest pain. ECG Impression:  No significant ST segment change suggestive of ischemia. Comparison with Prior Nuclear Study: No images to compare  Overall Impression:  Normal stress nuclear study.  There is no evidence of ischemia.  The LV function is normal.    Elyn Aquas., MD, [facility]     Please report.  The stress test was normal.  Patient should continue present medication.   Cassell Clement

## 2010-09-21 NOTE — Progress Notes (Addendum)
Copy routed to Dr. Patty Sermons.Scarlette Ar

## 2010-09-28 ENCOUNTER — Telehealth: Payer: Self-pay | Admitting: Cardiology

## 2010-09-28 NOTE — Telephone Encounter (Signed)
Appointment scheduled with Dr. Vassie Loll 6/19 at 3:00.  Advised patient

## 2010-09-28 NOTE — Progress Notes (Signed)
Advised of results and schedule appointment for dyspnea with Dr. Vassie Loll.

## 2010-09-28 NOTE — Telephone Encounter (Signed)
Called in today wanting to schedule her Pulmonary appointment. And she requested that you please not schedule an old doctor for her. Please call back.

## 2010-10-10 ENCOUNTER — Ambulatory Visit (INDEPENDENT_AMBULATORY_CARE_PROVIDER_SITE_OTHER): Payer: Medicare Other | Admitting: Pulmonary Disease

## 2010-10-10 ENCOUNTER — Encounter: Payer: Self-pay | Admitting: Pulmonary Disease

## 2010-10-10 VITALS — BP 160/98 | HR 61 | Temp 98.4°F | Ht 65.0 in | Wt 140.6 lb

## 2010-10-10 DIAGNOSIS — J449 Chronic obstructive pulmonary disease, unspecified: Secondary | ICD-10-CM

## 2010-10-10 DIAGNOSIS — I10 Essential (primary) hypertension: Secondary | ICD-10-CM

## 2010-10-10 DIAGNOSIS — R0602 Shortness of breath: Secondary | ICD-10-CM

## 2010-10-10 NOTE — Assessment & Plan Note (Signed)
Poor control inspite of  lopressor & lasix She will d/w PCP re: additional med

## 2010-10-10 NOTE — Assessment & Plan Note (Signed)
No evidence of flare She quit smoking in '88 . Trial of spiriva - she will call back if this helps.

## 2010-10-10 NOTE — Progress Notes (Signed)
  Subjective:    [name], female    DOB: 10-Dec-1935, 75 y.o.   MRN: [id number]  HPI PCP - Art [name] 75/F Ex smoker, quit '88, for evaluation of  dyspnea x 1.5 yrs She smoked upto 2PPd on weekends She cannot walk down the hall, rather long, cannot breathe. No wheezing, pedal edema, chest pain, or recurrent colds She had an echocardiogram on 04/27/09 which showed ejection fraction of 55-60% and no wall motion abnormalities. She was in atrial fibrillation at that time. She was found to have mild mitral regurgitation and mild aortic sclerosis with trivial aortic insufficiency. Nml nuclear stress study 09/20/10, high BP response Husband multiple hospitalisations , her BP has been running high 180/98 , decreased to 160/98 today, took BP meds CXR 09/11/10 hyperinflation Spirometry >>FEv1 61% c/w moderate airway obstruction    Review of Systems  Constitutional: Positive for unexpected weight change. Negative for fever.  HENT: Positive for rhinorrhea and sneezing. Negative for ear pain, nosebleeds, congestion, sore throat, trouble swallowing, dental problem, postnasal drip and sinus pressure.   Eyes: Negative for redness and itching.  Respiratory: Positive for shortness of breath. Negative for cough, chest tightness and wheezing.   Cardiovascular: Positive for leg swelling. Negative for palpitations.  Gastrointestinal: Negative for nausea and vomiting.  Genitourinary: Negative for dysuria.  Musculoskeletal: Negative for joint swelling.  Skin: Negative for rash.  Neurological: Negative for headaches.  Hematological: Bruises/bleeds easily.  Psychiatric/Behavioral: Negative for dysphoric mood. The patient is nervous/anxious.        Objective:   Physical Exam Gen. Pleasant, thin woman, in no distress, normal affect ENT - no lesions, no post nasal drip Neck: No JVD, no thyromegaly, no carotid bruits Lungs: no use of accessory muscles, no dullness to percussion, decreased without  rales or rhonchi  Cardiovascular: Rhythm regular, heart sounds  normal, no murmurs or gallops, no peripheral edema Abdomen: soft and non-tender, no hepatosplenomegaly, BS normal. Musculoskeletal: No deformities, no cyanosis or clubbing Neuro:  alert, non focal        Assessment & Plan:

## 2010-10-10 NOTE — Patient Instructions (Addendum)
Breathing test showed lung capacity of 60% Trial of spiriva - call me for Rx if this helps Better control of your BP - please discuss with your medical doctor

## 2010-10-16 ENCOUNTER — Telehealth: Payer: Self-pay | Admitting: *Deleted

## 2010-10-16 ENCOUNTER — Telehealth: Payer: Self-pay | Admitting: Pulmonary Disease

## 2010-10-16 DIAGNOSIS — Z79899 Other long term (current) drug therapy: Secondary | ICD-10-CM

## 2010-10-16 DIAGNOSIS — I10 Essential (primary) hypertension: Secondary | ICD-10-CM

## 2010-10-16 MED ORDER — TIOTROPIUM BROMIDE MONOHYDRATE 18 MCG IN CAPS: 18.0000 ug | ORAL_CAPSULE | Freq: Every day | RESPIRATORY_TRACT | Status: DC

## 2010-10-16 MED ORDER — LOSARTAN POTASSIUM 100 MG PO TABS: ORAL_TABLET | ORAL | Status: DC

## 2010-10-16 NOTE — Telephone Encounter (Signed)
Per last OV note Dr. Vassie Loll told the pt to try spiriva and if worked well to call for RX. Pt states spiriva works well for her and she [facility] Rx sent. Pt aware.Carron Curie, CMA

## 2010-10-16 NOTE — Telephone Encounter (Signed)
Having problems with her blood pressure so you will resume her losartan 100 mg at 1/2 daily and will check bmet in 1 week.  Verbalized understanding.

## 2010-10-16 NOTE — Telephone Encounter (Signed)
[id number] 

## 2010-10-16 NOTE — Telephone Encounter (Signed)
PATIENT RETURNED CALL PLEASE CALL BACK

## 2010-10-19 NOTE — Telephone Encounter (Signed)
Agree with plan 

## 2010-10-23 ENCOUNTER — Other Ambulatory Visit (INDEPENDENT_AMBULATORY_CARE_PROVIDER_SITE_OTHER): Payer: Medicare Other | Admitting: *Deleted

## 2010-10-23 DIAGNOSIS — Z79899 Other long term (current) drug therapy: Secondary | ICD-10-CM

## 2010-10-23 LAB — BASIC METABOLIC PANEL
CO2: 32 mEq/L (ref 19–32)
Calcium: 9.5 mg/dL (ref 8.4–10.5)
Chloride: 101 mEq/L (ref 96–112)
Creatinine, Ser: 0.9 mg/dL (ref 0.4–1.2)
Sodium: 141 mEq/L (ref 135–145)

## 2010-11-10 ENCOUNTER — Ambulatory Visit (INDEPENDENT_AMBULATORY_CARE_PROVIDER_SITE_OTHER): Payer: Medicare Other | Admitting: Pulmonary Disease

## 2010-11-10 ENCOUNTER — Encounter: Payer: Self-pay | Admitting: Pulmonary Disease

## 2010-11-10 VITALS — BP 142/90 | HR 76 | Temp 98.2°F | Ht 65.0 in | Wt 135.6 lb

## 2010-11-10 DIAGNOSIS — J449 Chronic obstructive pulmonary disease, unspecified: Secondary | ICD-10-CM

## 2010-11-10 NOTE — Patient Instructions (Signed)
OK to cut down on lasix mon/ wed /fri Stay on spiriva

## 2010-11-10 NOTE — Progress Notes (Signed)
  Subjective:    [name], female    DOB: 1935-11-09, 75 y.o.   MRN: [id number]  HPI PCP - Art [name]   75/F Ex smoker, quit '88, for FU of gold stg 2 COPD. She smoked upto 2PPd on weekends  She cannot walk down the hall, rather long, cannot breathe. She had an echocardiogram on 04/27/09 which showed ejection fraction of 55-60% and no wall motion abnormalities. She was in atrial fibrillation at that time. She was found to have mild mitral regurgitation and mild aortic sclerosis with trivial aortic insufficiency.  Nml nuclear stress study 09/20/10, high BP response  Her BP has been running high 180/98 , decreased to 160/98 today, took BP meds  CXR 09/11/10 hyperinflation  Spirometry >>FEv1 61% c/w moderate airway obstruction   11/10/2010 Spiriva improved her breathing Husband of 58 yrs passed away - grieving, tearful, but coping well.  No wheezing, pedal edema, chest pain, or recurrent colds   Review of Systems Pt denies any significant  nasal congestion or excess secretions, fever, chills, sweats, unintended wt loss, pleuritic or exertional cp, orthopnea pnd or leg swelling.  Pt also denies any obvious fluctuation in symptoms with weather or environmental change or other alleviating or aggravating factors.    Pt denies any increase in rescue therapy over baseline, denies waking up needing it or having early am exacerbations or coughing/wheezing/ or dyspnea      Objective:   Physical Exam Gen. Pleasant, well-nourished, in no distress ENT - no lesions, no post nasal drip Neck: No JVD, no thyromegaly, no carotid bruits Lungs: no use of accessory muscles, no dullness to percussion, clear without rales or rhonchi  Cardiovascular: Rhythm regular, heart sounds  normal, no murmurs or gallops, no peripheral edema Musculoskeletal: No deformities, no cyanosis or clubbing         Assessment & Plan:

## 2010-11-10 NOTE — Assessment & Plan Note (Signed)
Ct spiriva Update pneumovax Flu shot every year. Consider pulm rehab in future if dyspnea persists.

## 2010-11-18 ENCOUNTER — Other Ambulatory Visit: Payer: Self-pay | Admitting: Cardiology

## 2010-11-18 DIAGNOSIS — I10 Essential (primary) hypertension: Secondary | ICD-10-CM

## 2010-11-20 NOTE — Telephone Encounter (Signed)
Refilled losartan 100 mg °

## 2010-12-12 ENCOUNTER — Ambulatory Visit (INDEPENDENT_AMBULATORY_CARE_PROVIDER_SITE_OTHER): Payer: Medicare Other | Admitting: Cardiology

## 2010-12-12 DIAGNOSIS — I4891 Unspecified atrial fibrillation: Secondary | ICD-10-CM

## 2010-12-12 DIAGNOSIS — I119 Hypertensive heart disease without heart failure: Secondary | ICD-10-CM

## 2010-12-12 DIAGNOSIS — E7800 Pure hypercholesterolemia, unspecified: Secondary | ICD-10-CM

## 2010-12-12 DIAGNOSIS — J449 Chronic obstructive pulmonary disease, unspecified: Secondary | ICD-10-CM

## 2010-12-12 DIAGNOSIS — E119 Type 2 diabetes mellitus without complications: Secondary | ICD-10-CM

## 2010-12-12 DIAGNOSIS — I10 Essential (primary) hypertension: Secondary | ICD-10-CM

## 2010-12-12 MED ORDER — LOSARTAN POTASSIUM 100 MG PO TABS: ORAL_TABLET | ORAL | Status: DC

## 2010-12-12 NOTE — Assessment & Plan Note (Signed)
The patient has not been expressing any recent palpitations or rapid heart action.  She has not had any episodes of recent atrial fibrillation.  She is not on Coumadin

## 2010-12-12 NOTE — Assessment & Plan Note (Signed)
The patient has not been expressing any symptoms from her high blood pressure.  She denies any dizzy spells or syncope.  No headaches.  No palpitations or evidence of fluid retention

## 2010-12-12 NOTE — Progress Notes (Signed)
Erin Vang Date of Birth:  Aug 28, 1935 [facility] Card[facility] / [facility] [address]   [address] [place], [place]  [address] [phone]           Fax   [phone]  History of Present Illness: This pleasant 75 year old woman is seen for a scheduled followup office visit.  She is a recent widow.  Her husband died of cardiac complications several months ago.  The patient has been doing fair with the grieving process.  The patient does have a history of essential hypertension, diabetes mellitus, hypercholesterolemia, and COPD.  She has been experiencing previous significant exertional dyspnea.  However, this has improved significantly since her pulmonologist put her on Spiriva every day the patient had a recent nuclear stress test on 09/21/10 which showed no evidence of ischemia and normal LV function with an ejection fraction of 72%.  She walked for 4 minutes and 45 seconds but did not achieve her target rate and had to be switched to LexiScan protocol.  The patient had recent lab work at her primary care provider which was satisfactory except for a elevated BUN of 31 reflecting prerenal azotemia.  The patient has been on furosemide every other day and she is requesting to be taken off furosemide because it causes her to urinate so frequently.  She has not been expressing any orthopnea or peripheral edema.  Patient denies any recent chest pain.  Current Outpatient Prescriptions  Medication Sig Dispense Refill  . ALPRAZolam (XANAX) 0.5 MG tablet Take 0.5 mg by mouth at bedtime as needed.        Marland Kitchen aspirin 81 MG tablet Take 81 mg by mouth daily.        . Calcium Carbonate (CALCIUM 600 PO) Take by mouth 2 (two) times daily.        . Cholecalciferol (VITAMIN D PO) Take 2,000 mg by mouth daily.        . digoxin (LANOXIN) 0.125 MG tablet Take 125 mcg by mouth daily.        . Flaxseed, Linseed, (FLAX SEED OIL) 1000 MG CAPS Take by mouth. Takes 3 daily      . glipiZIDE (GLUCOTROL) 5 MG tablet Take  5 mg by mouth daily.        . Glucosamine-Chondroitin (RA GLUCOSAMINE-CHONDROITIN) 750-600 MG TABS Take by mouth daily.        Marland Kitchen losartan (COZAAR) 100 MG tablet Take half tablet daily  30 tablet  11  . metFORMIN (GLUCOPHAGE) 500 MG tablet Take 500 mg by mouth 2 (two) times daily with a meal.        . METOPROLOL TARTRATE PO Take 50 mg by mouth 2 (two) times daily.       . Multiple Vitamin (MULTIVITAMIN PO) Take by mouth.        . Omega-3 Fatty Acids (FISH OIL) 1200 MG CAPS Take by mouth.        . simvastatin (ZOCOR) 40 MG tablet Take 40 mg by mouth at bedtime.        Marland Kitchen tiotropium (SPIRIVA HANDIHALER) 18 MCG inhalation capsule Place 1 capsule (18 mcg total) into inhaler and inhale daily.  90 capsule  2  . vitamin C (ASCORBIC ACID) 500 MG tablet Take 500 mg by mouth daily.        Marland Kitchen DISCONTD: losartan (COZAAR) 100 MG tablet TAKE 1 TABLET EVERY DAY  30 tablet  11    Allergies  Allergen Reactions  . Erythromycin   . Macrobid   .  Nitrofurantoin   . Penicillins   . Sulfonamide Derivatives   . Triple Antibiotic   . Wool Alcohol (Lanolin Alcohol)     Hives     Patient Active Problem List  Diagnoses  . DIABETES MELLITUS, TYPE II  . ANXIETY  . HYPERTENSION  . Atrial fibrillation  . ALLERGIC RHINITIS  . DYSPNEA  . Osteoarthritis  . Hypercholesterolemia  . Personal history of atrial fibrillation  . Benign hypertensive heart disease without heart failure  . COPD (chronic obstructive pulmonary disease)    History  Smoking status  . Former Smoker -- 2.0 packs/day for 35 years  . Types: Cigarettes  . Quit date: 04/23/1986  Smokeless tobacco  . Never Used    History  Alcohol Use No    Family History  Problem Relation Age of Onset  . Heart failure Mother   . Cancer Father     Review of Systems: Constitutional: no fever chills diaphoresis or fatigue or change in weight.  Head and neck: no hearing loss, no epistaxis, no photophobia or visual disturbance. Respiratory: No cough,  shortness of breath or wheezing. Cardiovascular: No chest pain peripheral edema, palpitations. Gastrointestinal: No abdominal distention, no abdominal pain, no change in bowel habits hematochezia or melena. Genitourinary: No dysuria, no frequency, no urgency, no nocturia. Musculoskeletal:No arthralgias, no back pain, no gait disturbance or myalgias. Neurological: No dizziness, no headaches, no numbness, no seizures, no syncope, no weakness, no tremors. Hematologic: No lymphadenopathy, no easy bruising. Psychiatric: No confusion, no hallucinations, no sleep disturbance.    Physical Exam: Filed Vitals:   12/12/10 1424  BP: 170/80  Pulse: 80  The general appearance reveals a well-developed well-nourished woman in no distress.  She is somewhat tearful today because of her husbands recent unexpected death.  This could explain her systolic hypertension today.  The head and neck exam revealed that the pupils are equal and reactive sclerae are clear mouth and pharynx normal carotids normal jugular venous pressure normal thyroid normal no lymphadenopathy.The chest is clear to percussion and auscultation. There are no rales or rhonchi. Expansion of the chest is symmetrical.  The precordium is quiet.  The first heart sound is normal.  The second heart sound is physiologically split.  There is no murmur gallop rub or click.  There is no abnormal lift or heave.  The abdomen is soft and nontender. Bowel sounds are normal. The liver and spleen are not enlarged. There Are no abdominal masses. There are no bruits.    Normal extremityStrength is normal and symmetrical in all extremities.  There is no lateralizing weakness.  There are no sensory deficits.  The skin is warm and dry.  There is no rash.     Assessment / Plan: The patient is to continue same medication except we will stop her torsemide altogether.  If she begins to have weight gain or recurrent edema she is to call us.  Recheck in 3 months  for followup office visit.

## 2010-12-12 NOTE — Assessment & Plan Note (Addendum)
The patient is on glipizide and metformin for her diabetes.  She has not been having any hypoglycemic reactions.Her recent blood sugar at her primary care provider's office on 11/27/10 was 100

## 2011-02-09 ENCOUNTER — Telehealth: Payer: Self-pay | Admitting: Pulmonary Disease

## 2011-02-09 NOTE — Telephone Encounter (Signed)
Ok to leave off spiriva and let us know if not better by Monday 10/22

## 2011-02-09 NOTE — Telephone Encounter (Signed)
Pt c/o dry raised red bumps under lower lip x 1 week, dry mouth, dizzy, increased SOB with exertion. Pt states does not feel any change with using Spiriva. Dr. Vassie Loll is out of the office, and pt is aware. Will forward to the "doc of the day", Dr. Sherene Sires please advise. Thanks

## 2011-02-09 NOTE — Telephone Encounter (Signed)
I informed pt of MW's recommendations and advised if breathing gets worse over the weekend to call 911 or go to ER or UC. Pt verbalized understanding.

## 2011-02-12 ENCOUNTER — Telehealth: Payer: Self-pay | Admitting: Internal Medicine

## 2011-02-12 NOTE — Telephone Encounter (Signed)
Called and spoke with pt. Pt states she stopped Spiriva per the instructions given over the phone on 10/19.  Pt states her lips/mouth are less dry and the bumps are gone and the constipation has improved. However, pt states she still has sob.  RA out of office until nov.  Therefore, pt agreed to come in and see TP on Thursday 10/25 at 3:30 pm.  Pt instructed to go to UC/ED if symptoms worsen before appt and she feels she needs emergency care.

## 2011-02-15 ENCOUNTER — Ambulatory Visit: Payer: Medicare Other | Admitting: Adult Health

## 2011-02-19 ENCOUNTER — Ambulatory Visit (INDEPENDENT_AMBULATORY_CARE_PROVIDER_SITE_OTHER): Payer: Medicare Other | Admitting: Adult Health

## 2011-02-19 ENCOUNTER — Encounter: Payer: Self-pay | Admitting: Adult Health

## 2011-02-19 VITALS — BP 134/68 | HR 86 | Temp 97.0°F | Ht 65.5 in | Wt 135.8 lb

## 2011-02-19 DIAGNOSIS — J449 Chronic obstructive pulmonary disease, unspecified: Secondary | ICD-10-CM

## 2011-02-19 NOTE — Patient Instructions (Signed)
Begin Symbicort 160/4.32mcg 2 puffs Twice daily  -brush/rinse and gargle after use follow up Dr. Vassie Loll  As planned in 4 weeks and As needed

## 2011-02-19 NOTE — Progress Notes (Signed)
  Subjective:    [name], female    DOB: 07/21/1935, 75 y.o.   MRN: [id number]  HPI  PCP - Art [name]   75/F Ex smoker, quit '88, for FU of gold stg 2 COPD. She smoked upto 2PPd on weekends  She cannot walk down the hall, rather long, cannot breathe. She had an echocardiogram on 04/27/09 which showed ejection fraction of 55-60% and no wall motion abnormalities. She was in atrial fibrillation at that time. She was found to have mild mitral regurgitation and mild aortic sclerosis with trivial aortic insufficiency.  Nml nuclear stress study 09/20/10, high BP response  Her BP has been running high 180/98 , decreased to 160/98 today, took BP meds  CXR 09/11/10 hyperinflation  Spirometry >>FEv1 61% c/w moderate airway obstruction   11/10/2010 Spiriva improved her breathing Husband of 58 yrs passed away - grieving, tearful, but coping well.  No wheezing, pedal edema, chest pain, or recurrent colds  >>no changes   02/19/2011 Follow up  Here for follow up. Stopped spiriva last week -causing mouth dryness and irritation - Feels more dypsneic since stopping. No discolored mucus or fever  No chest pain or edema.     . Review of Systems Constitutional:   No  weight loss, night sweats,  Fevers, chills, fatigue, or  lassitude.  HEENT:   No headaches,  Difficulty swallowing,  Tooth/dental problems, or  Sore throat,                No sneezing, itching, ear ache, nasal congestion, post nasal drip,   CV:  No chest pain,  Orthopnea, PND, swelling in lower extremities, anasarca, dizziness, palpitations, syncope.   GI  No heartburn, indigestion, abdominal pain, nausea, vomiting, diarrhea, change in bowel habits, loss of appetite, bloody stools.   Resp:    No coughing up of blood.  No change in color of mucus.  No wheezing.  No chest wall deformity  Skin: no rash or lesions.  GU: no dysuria, change in color of urine, no urgency or frequency.  No flank pain, no hematuria   MS:  No  joint pain or swelling.  No decreased range of motion.  No back pain.  Psych:  No change in mood or affect. No depression or anxiety.  No memory loss.         Objective:   Physical Exam  Gen. Pleasant, well-nourished, in no distress ENT - no lesions, no post nasal drip Neck: No JVD, no thyromegaly, no carotid bruits Lungs: no use of accessory muscles, no dullness to percussion, clear without rales or rhonchi  Cardiovascular: Rhythm regular, heart sounds  normal, no murmurs or gallops, no peripheral edema Musculoskeletal: No deformities, no cyanosis or clubbing         Assessment & Plan:

## 2011-02-19 NOTE — Assessment & Plan Note (Signed)
Intolerant to Spiriva with oral side effects , with decline off inhaler Will give trial of Symbicort   Plan;  Begin Symbicort 160/4.3mcg 2 puffs Twice daily  -brush/rinse and gargle after use follow up Dr. Vassie Loll  As planned in 4 weeks and As needed

## 2011-03-12 ENCOUNTER — Encounter: Payer: Self-pay | Admitting: Pulmonary Disease

## 2011-03-12 ENCOUNTER — Ambulatory Visit (INDEPENDENT_AMBULATORY_CARE_PROVIDER_SITE_OTHER): Payer: Medicare Other | Admitting: Pulmonary Disease

## 2011-03-12 VITALS — BP 142/62 | HR 120 | Temp 98.2°F | Ht 65.5 in | Wt 133.8 lb

## 2011-03-12 DIAGNOSIS — J449 Chronic obstructive pulmonary disease, unspecified: Secondary | ICD-10-CM

## 2011-03-12 NOTE — Progress Notes (Signed)
  Subjective:    [name], female    DOB: 14-Jun-1935, 75 y.o.   MRN: [id number]  HPI PCP - Art [name]   75/F Ex smoker, quit '88, for FU of gold stg 2 COPD.  She smoked upto 2PPd on weekends  She cannot walk down the hall, rather long, cannot breathe.  She had an echocardiogram on 04/27/09 which showed ejection fraction of 55-60% and no wall motion abnormalities. She was in atrial fibrillation at that time. She was found to have mild mitral regurgitation and mild aortic sclerosis with trivial aortic insufficiency.  Nml nuclear stress study 09/20/10, high BP response  Her BP has been running high 180/98 , decreased to 160/98 today, took BP meds  CXR 09/11/10 hyperinflation  Spirometry >>FEv1 61% c/w moderate airway obstruction   11/10/2010  Spiriva improved her breathing  Husband of 58 yrs passed away - grieving, tearful, but coping well.  No wheezing, pedal edema, chest pain, or recurrent colds >>no changes    03/12/2011 Intolerant to Spiriva with oral side effects , with decline off inhaler>> trial of symbicort but has complaints with this too Felt lightheaded one occasion & fell in the bushes, reviewed BP record - on the high systolic side 140s C/ blood crusted secretions from nose Remains in grief,  Tearful in office, C/o insomnia - tried melatonin 3 mg at bedtime - not helping  No discolored mucus or fever  No chest pain or edema.       Review of Systems Patient denies significant dyspnea,cough, hemoptysis,  chest pain, palpitations, pedal edema, orthopnea, paroxysmal nocturnal dyspnea, lightheadedness, nausea, vomiting, abdominal or  leg pains      Objective:   Physical Exam  Gen. Pleasant, well-nourished, in no distress ENT - no lesions, no post nasal drip Neck: No JVD, no thyromegaly, no carotid bruits Lungs: no use of accessory muscles, no dullness to percussion, decreased without rales or rhonchi  Cardiovascular: Rhythm regular, heart sounds  normal,  no murmurs or gallops, no peripheral edema Musculoskeletal: No deformities, no cyanosis or clubbing        Assessment & Plan:

## 2011-03-12 NOTE — Assessment & Plan Note (Signed)
Can dc symbicort too & simply use SABA as needed Pulm rehab would be best for her - but she is not interested Feel grief is now leading to depresion - ct melatonin for insomnia - perhaps trazodone indicated if worse.

## 2011-03-12 NOTE — Patient Instructions (Addendum)
OK to stop SYMBICORT after you are done Take ALBUTEROL pump 2 puffs as needed for shortness of breath  - upto 4 times daily OK to take upto 5 mg melatonin at 8pm Let me know if you want to enroll in an exercise program

## 2011-03-16 ENCOUNTER — Ambulatory Visit (INDEPENDENT_AMBULATORY_CARE_PROVIDER_SITE_OTHER): Payer: Medicare Other | Admitting: Cardiology

## 2011-03-16 VITALS — BP 162/72 | HR 65 | Ht 65.0 in | Wt 132.8 lb

## 2011-03-16 DIAGNOSIS — I10 Essential (primary) hypertension: Secondary | ICD-10-CM

## 2011-03-16 DIAGNOSIS — I119 Hypertensive heart disease without heart failure: Secondary | ICD-10-CM

## 2011-03-16 DIAGNOSIS — I4891 Unspecified atrial fibrillation: Secondary | ICD-10-CM

## 2011-03-16 DIAGNOSIS — E119 Type 2 diabetes mellitus without complications: Secondary | ICD-10-CM

## 2011-03-16 DIAGNOSIS — E7800 Pure hypercholesterolemia, unspecified: Secondary | ICD-10-CM

## 2011-03-16 NOTE — Patient Instructions (Signed)
Your physician recommends that you continue on your current medications as directed. Please refer to the Current Medication list given to you today. Your physician recommends that you schedule a follow-up appointment in: 4 months with EKG  

## 2011-03-16 NOTE — Assessment & Plan Note (Signed)
The patient notes occasional isolated palpitations but no sustained arrhythmia since last visit

## 2011-03-16 NOTE — Assessment & Plan Note (Signed)
Blood pressure by me today was 150/80 and she is still very emotional about her husband's death which contributes to the high systolic reading.  She has not been having any symptoms of congestive heart failure.  No dizziness or syncope.

## 2011-03-16 NOTE — Progress Notes (Signed)
[name] Date of Birth:  01/13/1936 [facility] / [facility] [address]   [address] [place], [place]  [address] [phone]           Fax   [phone]  HPI: This pleasant 75 year old woman is seen for a scheduled followup office visit.  She is a recent widow.  Her husband died within the past year.  They have been married 57 years.  Her husband was a retired Librarian, academic from [employer].  The patient is still having a difficult time handling her husband's death.  She is sleeping a little better now with the help of melatonin.  He has a past history of paroxysmal atrial fibrillation and history of high blood pressure.  She also has diabetes.  She has a history of COPD and high blood pressure.  Current Outpatient Prescriptions  Medication Sig Dispense Refill  . ALPRAZolam (XANAX) 0.5 MG tablet Take 0.5 mg by mouth at bedtime as needed.        Marland Kitchen aspirin 81 MG tablet Take 81 mg by mouth daily.        . Calcium Carbonate (CALCIUM 600 PO) Take by mouth daily.       . Cholecalciferol (VITAMIN D PO) Take 2,000 mg by mouth daily.        . digoxin (LANOXIN) 0.125 MG tablet Take 125 mcg by mouth 2 (two) times daily.       . Flaxseed, Linseed, (FLAX SEED OIL) 1000 MG CAPS Take by mouth. Takes 3 daily      . glipiZIDE (GLUCOTROL) 5 MG tablet Take 5 mg by mouth daily.        . Glucosamine-Chondroitin (RA GLUCOSAMINE-CHONDROITIN) 750-600 MG TABS Take by mouth daily.        Marland Kitchen losartan (COZAAR) 50 MG tablet Take 50 mg by mouth daily.        . metFORMIN (GLUCOPHAGE) 500 MG tablet Take 500 mg by mouth 2 (two) times daily with a meal.        . METOPROLOL TARTRATE PO Take 50 mg by mouth 2 (two) times daily.       . Multiple Vitamin (MULTIVITAMIN PO) Take by mouth.        . Omega-3 Fatty Acids (FISH OIL) 1200 MG CAPS Take by mouth.        . simvastatin (ZOCOR) 40 MG tablet Take 40 mg by mouth at bedtime.        . vitamin C (ASCORBIC ACID) 500 MG tablet Take 500 mg by mouth  daily.          Allergies  Allergen Reactions  . Erythromycin   . Macrobid   . Nitrofurantoin   . Penicillins   . Sulfonamide Derivatives   . Triple Antibiotic   . Wool Alcohol (Lanolin Alcohol)     Hives     Patient Active Problem List  Diagnoses  . DIABETES MELLITUS, TYPE II  . ANXIETY  . HYPERTENSION  . Atrial fibrillation  . ALLERGIC RHINITIS  . DYSPNEA  . Osteoarthritis  . Hypercholesterolemia  . Personal history of atrial fibrillation  . Benign hypertensive heart disease without heart failure  . COPD (chronic obstructive pulmonary disease)    History  Smoking status  . Former Smoker -- 2.0 packs/day for 35 years  . Types: Cigarettes  . Quit date: 04/23/1986  Smokeless tobacco  . Never Used    History  Alcohol Use No    Family History  Problem Relation  Age of Onset  . Heart failure Mother   . Cancer Father     Review of Systems: The patient denies any heat or cold intolerance.  No weight gain or weight loss.  The patient denies headaches or blurry vision.  There is no cough or sputum production.  The patient denies dizziness.  There is no hematuria or hematochezia.  The patient denies any muscle aches or arthritis.  The patient denies any rash.  The patient denies frequent falling or instability.  There is no history of depression or anxiety.  All other systems were reviewed and are negative.   Physical Exam: Filed Vitals:   03/16/11 1457  BP: 162/72  Pulse: 65   I repeated her blood pressure and got 150/80.The head and neck exam reveals pupils equal and reactive.  Extraocular movements are full.  There is no scleral icterus.  The mouth and pharynx are normal.  The neck is supple.  The carotids reveal no bruits.  The jugular venous pressure is normal.  The  thyroid is not enlarged.  There is no lymphadenopathy.  The chest is clear to percussion and auscultation.  There are no rales or rhonchi.  Expansion of the chest is symmetrical.  The precordium is  quiet.  The first heart sound is normal.  The second heart sound is physiologically split.  There is no murmur gallop rub or click.  There is no abnormal lift or heave.  The abdomen is soft and nontender.  The bowel sounds are normal.  The liver and spleen are not enlarged.  There are no abdominal masses.  There are no abdominal bruits.  Extremities reveal good pedal pulses.  There is no phlebitis or edema.  There is no cyanosis or clubbing.  Strength is normal and symmetrical in all extremities.  There is no lateralizing weakness.  There are no sensory deficits.  The skin is warm and dry.  There is no rash.      Assessment / Plan: Continue present medication.  Recheck in 4 months for office visit and EKG

## 2011-03-16 NOTE — Assessment & Plan Note (Signed)
She has not been having any hypoglycemic episodes.  She has been losing weight and Dr. [name] has given her Megace to try to help her gain back some weight

## 2011-05-07 ENCOUNTER — Ambulatory Visit (HOSPITAL_COMMUNITY): Payer: Medicare Other

## 2011-05-08 ENCOUNTER — Encounter (HOSPITAL_COMMUNITY): Payer: Medicare Other

## 2011-05-09 ENCOUNTER — Encounter (HOSPITAL_COMMUNITY)
Admission: RE | Admit: 2011-05-09 | Discharge: 2011-05-09 | Disposition: A | Discharge status: Home / Self Care | Payer: Medicare Other | Source: Ambulatory Visit | Attending: Pulmonary Disease | Admitting: Pulmonary Disease

## 2011-05-09 ENCOUNTER — Encounter (HOSPITAL_COMMUNITY): Payer: Self-pay

## 2011-05-09 DIAGNOSIS — R0989 Other specified symptoms and signs involving the circulatory and respiratory systems: Secondary | ICD-10-CM | POA: Insufficient documentation

## 2011-05-09 DIAGNOSIS — D472 Monoclonal gammopathy: Secondary | ICD-10-CM | POA: Insufficient documentation

## 2011-05-09 DIAGNOSIS — J449 Chronic obstructive pulmonary disease, unspecified: Secondary | ICD-10-CM | POA: Insufficient documentation

## 2011-05-09 DIAGNOSIS — R0609 Other forms of dyspnea: Secondary | ICD-10-CM | POA: Insufficient documentation

## 2011-05-09 DIAGNOSIS — I119 Hypertensive heart disease without heart failure: Secondary | ICD-10-CM | POA: Insufficient documentation

## 2011-05-09 DIAGNOSIS — J4489 Other specified chronic obstructive pulmonary disease: Secondary | ICD-10-CM | POA: Insufficient documentation

## 2011-05-09 DIAGNOSIS — Z5189 Encounter for other specified aftercare: Secondary | ICD-10-CM | POA: Insufficient documentation

## 2011-05-09 DIAGNOSIS — E7800 Pure hypercholesterolemia, unspecified: Secondary | ICD-10-CM | POA: Insufficient documentation

## 2011-05-09 HISTORY — DX: Syncope and collapse: R55

## 2011-05-09 NOTE — Progress Notes (Signed)
Ms.  Whitis came for [facility] Orientation and 6 min walk test today.  She will begin exercise on 05-10-2011.  Demonstration and practice of PLB using pulse oximeter.  Patient able to return demonstration satisfactorily. Safety and hand hygiene in the exercise area reviewed with patient.  Patient voices understanding.

## 2011-05-10 ENCOUNTER — Encounter: Payer: Self-pay | Admitting: Cardiology

## 2011-05-10 ENCOUNTER — Encounter (HOSPITAL_COMMUNITY)
Admission: RE | Admit: 2011-05-10 | Discharge: 2011-05-10 | Disposition: A | Discharge status: Home / Self Care | Payer: Medicare Other | Source: Ambulatory Visit | Attending: Pulmonary Disease | Admitting: Pulmonary Disease

## 2011-05-10 NOTE — Progress Notes (Signed)
First day of exercise for Erin Vang.  She was oriented to equipment use, RPE and Dyspnea Scale, rest breaks.  Demonstration and practice of PLB while on each work station.  Tolerated well.  Vital signs stable. She has not been doing CBG checks at home regularly  Or following her diabetic diet.  We will follow and encourage patient to be more  proactive in her diabetic management.  Will encourage diabetic classes offered for Cardiac Rehab.

## 2011-05-11 LAB — GLUCOSE, CAPILLARY: Glucose-Capillary: 148 mg/dL — ABNORMAL HIGH (ref 70–99)

## 2011-05-15 ENCOUNTER — Encounter (HOSPITAL_COMMUNITY)
Admission: RE | Admit: 2011-05-15 | Discharge: 2011-05-15 | Disposition: A | Discharge status: Home / Self Care | Payer: Medicare Other | Source: Ambulatory Visit | Attending: Pulmonary Disease | Admitting: Pulmonary Disease

## 2011-05-17 ENCOUNTER — Encounter (HOSPITAL_COMMUNITY)
Admission: RE | Admit: 2011-05-17 | Discharge: 2011-05-17 | Disposition: A | Discharge status: Home / Self Care | Payer: Medicare Other | Source: Ambulatory Visit | Attending: Pulmonary Disease | Admitting: Pulmonary Disease

## 2011-05-22 ENCOUNTER — Encounter (HOSPITAL_COMMUNITY)
Admission: RE | Admit: 2011-05-22 | Discharge: 2011-05-22 | Disposition: A | Discharge status: Home / Self Care | Payer: Medicare Other | Source: Ambulatory Visit | Attending: Pulmonary Disease | Admitting: Pulmonary Disease

## 2011-05-22 NOTE — Progress Notes (Signed)
Completed home exercise with patient. Reviewed exercise progression, routine, exercising at a comfortable pace, RPE/Dyspnea scales, how important it is to own a pulse oximeter and how to use one, weather conditions, warning signs and symptoms with exercise, and CP/NTG. We discussed when to call MD. Patient voices understanding. Patient has a goal to improve endurance levels to be able to accomplish more ADLs. Patient is going to start walking on TM again so I explained that in the program we would help her learn safe ways to walk. Will continue to encourage and support.

## 2011-05-24 ENCOUNTER — Emergency Department (HOSPITAL_COMMUNITY): Payer: Medicare Other

## 2011-05-24 ENCOUNTER — Other Ambulatory Visit: Payer: Self-pay

## 2011-05-24 ENCOUNTER — Encounter (HOSPITAL_COMMUNITY)
Admission: RE | Admit: 2011-05-24 | Discharge: 2011-05-24 | Disposition: A | Discharge status: Home / Self Care | Payer: Medicare Other | Source: Ambulatory Visit | Attending: Cardiology | Admitting: Cardiology

## 2011-05-24 ENCOUNTER — Telehealth: Payer: Self-pay | Admitting: *Deleted

## 2011-05-24 ENCOUNTER — Inpatient Hospital Stay (HOSPITAL_COMMUNITY)
Admission: EM | Admit: 2011-05-24 | Discharge: 2011-05-27 | DRG: 310 | Disposition: A | Discharge status: Home / Self Care | Payer: Medicare Other | Source: Ambulatory Visit | Attending: Internal Medicine | Admitting: Internal Medicine

## 2011-05-24 DIAGNOSIS — Z7982 Long term (current) use of aspirin: Secondary | ICD-10-CM

## 2011-05-24 DIAGNOSIS — E119 Type 2 diabetes mellitus without complications: Secondary | ICD-10-CM | POA: Diagnosis present

## 2011-05-24 DIAGNOSIS — D649 Anemia, unspecified: Secondary | ICD-10-CM | POA: Diagnosis present

## 2011-05-24 DIAGNOSIS — F411 Generalized anxiety disorder: Secondary | ICD-10-CM | POA: Diagnosis present

## 2011-05-24 DIAGNOSIS — J449 Chronic obstructive pulmonary disease, unspecified: Secondary | ICD-10-CM | POA: Diagnosis present

## 2011-05-24 DIAGNOSIS — I10 Essential (primary) hypertension: Secondary | ICD-10-CM | POA: Diagnosis present

## 2011-05-24 DIAGNOSIS — D72829 Elevated white blood cell count, unspecified: Secondary | ICD-10-CM | POA: Diagnosis present

## 2011-05-24 DIAGNOSIS — I4891 Unspecified atrial fibrillation: Secondary | ICD-10-CM

## 2011-05-24 DIAGNOSIS — F329 Major depressive disorder, single episode, unspecified: Secondary | ICD-10-CM | POA: Diagnosis present

## 2011-05-24 DIAGNOSIS — R001 Bradycardia, unspecified: Secondary | ICD-10-CM | POA: Diagnosis present

## 2011-05-24 DIAGNOSIS — K5909 Other constipation: Secondary | ICD-10-CM | POA: Diagnosis present

## 2011-05-24 DIAGNOSIS — K5900 Constipation, unspecified: Secondary | ICD-10-CM | POA: Diagnosis present

## 2011-05-24 DIAGNOSIS — F341 Dysthymic disorder: Secondary | ICD-10-CM | POA: Diagnosis present

## 2011-05-24 DIAGNOSIS — R630 Anorexia: Secondary | ICD-10-CM | POA: Diagnosis present

## 2011-05-24 DIAGNOSIS — R634 Abnormal weight loss: Secondary | ICD-10-CM | POA: Diagnosis present

## 2011-05-24 DIAGNOSIS — J4489 Other specified chronic obstructive pulmonary disease: Secondary | ICD-10-CM | POA: Diagnosis present

## 2011-05-24 DIAGNOSIS — Z87891 Personal history of nicotine dependence: Secondary | ICD-10-CM

## 2011-05-24 HISTORY — DX: Paroxysmal atrial fibrillation: I48.0

## 2011-05-24 HISTORY — DX: Chronic obstructive pulmonary disease, unspecified: J44.9

## 2011-05-24 HISTORY — DX: Reserved for inherently not codable concepts without codable children: IMO0001

## 2011-05-24 HISTORY — DX: Anemia, unspecified: D64.9

## 2011-05-24 LAB — RETICULOCYTES
Retic Count, Absolute: 46.8 10*3/uL (ref 19.0–186.0)
Retic Ct Pct: 1 % (ref 0.4–3.1)

## 2011-05-24 LAB — COMPREHENSIVE METABOLIC PANEL
ALT: 18 U/L (ref 0–35)
Alkaline Phosphatase: 65 U/L (ref 39–117)
BUN: 20 mg/dL (ref 6–23)
CO2: 24 mEq/L (ref 19–32)
GFR calc Af Amer: >90 mL/min (ref >90)
GFR calc non Af Amer: 86 mL/min — ABNORMAL LOW (ref >90)
Glucose, Bld: 95 mg/dL (ref 70–99)
Total Bilirubin: 0.2 mg/dL — ABNORMAL LOW (ref 0.3–1.2)

## 2011-05-24 LAB — CBC
MCHC: 35.8 g/dL (ref 30.0–36.0)
Platelets: 261 10*3/uL (ref 150–400)
RDW: 19.3 % — ABNORMAL HIGH (ref 11.5–15.5)
WBC: 14.5 10*3/uL — ABNORMAL HIGH (ref 4.0–10.5)

## 2011-05-24 LAB — GLUCOSE, CAPILLARY: Glucose-Capillary: 131 mg/dL — ABNORMAL HIGH (ref 70–99)

## 2011-05-24 LAB — TROPONIN I: Troponin I: <0.3 ng/mL (ref <0.30)

## 2011-05-24 LAB — PROTIME-INR: Prothrombin Time: 12.5 seconds (ref 11.6–15.2)

## 2011-05-24 LAB — ABO/RH: ABO/RH(D): A POS

## 2011-05-24 MED ORDER — PANTOPRAZOLE SODIUM 40 MG IV SOLR
40.0000 mg | Freq: Two times a day (BID) | INTRAVENOUS | Status: DC
  Administered 2011-05-24 – 2011-05-27 (×6): 40 mg via INTRAVENOUS
  Filled 2011-05-24 (×7): qty 40

## 2011-05-24 MED ORDER — TOLTERODINE TARTRATE ER 4 MG PO CP24
4.0000 mg | ORAL_CAPSULE | Freq: Every day | ORAL | Status: DC
  Administered 2011-05-25 – 2011-05-27 (×3): 4 mg via ORAL
  Filled 2011-05-24 (×3): qty 1

## 2011-05-24 MED ORDER — DIGOXIN 125 MCG PO TABS
125.0000 ug | ORAL_TABLET | Freq: Every day | ORAL | Status: DC
  Filled 2011-05-24 (×2): qty 1

## 2011-05-24 MED ORDER — INSULIN ASPART 100 UNIT/ML ~~LOC~~ SOLN
0.0000 [IU] | Freq: Three times a day (TID) | SUBCUTANEOUS | Status: DC
  Administered 2011-05-25: 1 [IU] via SUBCUTANEOUS
  Administered 2011-05-25 – 2011-05-27 (×5): 2 [IU] via SUBCUTANEOUS
  Filled 2011-05-24: qty 3

## 2011-05-24 MED ORDER — SODIUM CHLORIDE 0.9 % IV SOLN
INTRAVENOUS | Status: DC
  Administered 2011-05-24: 20:00:00 via INTRAVENOUS

## 2011-05-24 MED ORDER — INSULIN ASPART 100 UNIT/ML ~~LOC~~ SOLN
0.0000 [IU] | Freq: Every day | SUBCUTANEOUS | Status: DC
  Administered 2011-05-25: 2 [IU] via SUBCUTANEOUS

## 2011-05-24 MED ORDER — ROSUVASTATIN CALCIUM 10 MG PO TABS
10.0000 mg | ORAL_TABLET | Freq: Every day | ORAL | Status: DC
  Administered 2011-05-24 – 2011-05-26 (×3): 10 mg via ORAL
  Filled 2011-05-24 (×4): qty 1

## 2011-05-24 MED ORDER — METOPROLOL TARTRATE 50 MG PO TABS
50.0000 mg | ORAL_TABLET | Freq: Two times a day (BID) | ORAL | Status: DC
  Administered 2011-05-24 – 2011-05-26 (×5): 50 mg via ORAL
  Filled 2011-05-24 (×7): qty 1

## 2011-05-24 MED ORDER — ASPIRIN 81 MG PO TABS: 81.0000 mg | ORAL_TABLET | Freq: Every evening | ORAL | Status: DC

## 2011-05-24 MED ORDER — SODIUM CHLORIDE 0.9 % IJ SOLN
3.0000 mL | Freq: Two times a day (BID) | INTRAMUSCULAR | Status: DC
  Administered 2011-05-24 – 2011-05-27 (×6): 3 mL via INTRAVENOUS

## 2011-05-24 MED ORDER — ALBUTEROL SULFATE HFA 108 (90 BASE) MCG/ACT IN AERS
2.0000 | INHALATION_SPRAY | Freq: Four times a day (QID) | RESPIRATORY_TRACT | Status: DC | PRN
  Filled 2011-05-24: qty 6.7

## 2011-05-24 MED ORDER — DILTIAZEM HCL 50 MG/10ML IV SOLN
10.0000 mg | Freq: Once | INTRAVENOUS | Status: AC
  Administered 2011-05-24: 10 mg via INTRAVENOUS
  Filled 2011-05-24: qty 2

## 2011-05-24 MED ORDER — DILTIAZEM HCL 25 MG/5ML IV SOLN
INTRAVENOUS | Status: AC
  Administered 2011-05-24: 10 mg via INTRAVENOUS
  Filled 2011-05-24: qty 5

## 2011-05-24 MED ORDER — SIMVASTATIN 40 MG PO TABS: 40.0000 mg | ORAL_TABLET | Freq: Every evening | ORAL | Status: DC

## 2011-05-24 MED ORDER — ASPIRIN EC 81 MG PO TBEC
81.0000 mg | DELAYED_RELEASE_TABLET | Freq: Every day | ORAL | Status: DC
  Administered 2011-05-26 – 2011-05-27 (×2): 81 mg via ORAL
  Filled 2011-05-24 (×3): qty 1

## 2011-05-24 MED ORDER — DILTIAZEM HCL 100 MG IV SOLR
5.0000 mg/h | INTRAVENOUS | Status: DC
  Administered 2011-05-24: 15 mg/h via INTRAVENOUS
  Administered 2011-05-24: 5 mg/h via INTRAVENOUS
  Filled 2011-05-24: qty 100

## 2011-05-24 MED ORDER — LOSARTAN POTASSIUM 50 MG PO TABS
50.0000 mg | ORAL_TABLET | Freq: Two times a day (BID) | ORAL | Status: DC
Start: 2011-05-24 — End: 2011-05-27
  Administered 2011-05-25 – 2011-05-27 (×5): 50 mg via ORAL
  Filled 2011-05-24 (×7): qty 1

## 2011-05-24 NOTE — ED Provider Notes (Signed)
History     CSN: [id number]  Arrival date & time 05/24/11  1557   First MD Initiated Contact with Patient 05/24/11 1605      Chief Complaint  Patient presents with  . Irregular Heart Beat    HPI The patient presents with concerns of tachycardia.  She has a notable history of atrial fibrillation, as well as COPD.  She notes that she has been in her usual state of health.  Today she was at a pulmonary rehabilitation appointment, and after completing her session, was found to be tachycardic.  On ED arrival the patient denies any complaints, denies new medications beyond Vesicare.  Past Medical History  Diagnosis Date  . Hypertension   . Diabetes mellitus   . Arrhythmia   . Osteoarthritis   . Hypercholesterolemia   . Personal history of atrial fibrillation   . Syncope and collapse     When patient stands up real quickly    Past Surgical History  Procedure Date  . Gallbladder surgery   . Appendectomy     age 76  . Cervical spine surgery jan 2011    Family History  Problem Relation Age of Onset  . Heart failure Mother   . Cancer Father     History  Substance Use Topics  . Smoking status: Former Smoker -- 2.0 packs/day for 35 years    Types: Cigarettes    Quit date: 04/23/1986  . Smokeless tobacco: Never Used  . Alcohol Use: No    OB History    Grav Para Term Preterm Abortions TAB SAB Ect Mult Living                  Review of Systems  Constitutional:       HPI  HENT:       HPI otherwise negative  Eyes: Negative.   Respiratory:       HPI, otherwise negative  Cardiovascular:       HPI, otherwise nmegative  Gastrointestinal: Positive for constipation. Negative for vomiting.  Genitourinary:       HPI, otherwise negative  Musculoskeletal:       HPI, otherwise negative  Skin: Negative.   Neurological: Negative for syncope.    Allergies  Erythromycin; Macrobid; Nitrofurantoin; Penicillins; Sulfonamide derivatives; Triple antibiotic; and Wool  alcohol  Home Medications   Current Outpatient Rx  Name Route Sig Dispense Refill  . ALPRAZOLAM 0.5 MG PO TABS Oral Take 0.5 mg by mouth at bedtime as needed.      . ASPIRIN 81 MG PO TABS Oral Take 81 mg by mouth daily.      Marland Kitchen CALCIUM 600 PO Oral Take by mouth daily.     Marland Kitchen VITAMIN D PO Oral Take 2,000 mg by mouth daily.      Marland Kitchen DIGOXIN 0.125 MG PO TABS Oral Take 125 mcg by mouth 2 (two) times daily.     Marland Kitchen FLAX SEED OIL 1000 MG PO CAPS Oral Take by mouth. Takes 3 daily    . GLIPIZIDE 5 MG PO TABS Oral Take 5 mg by mouth daily.      Marland Kitchen GLUCOSAMINE-CHONDROITIN 750-600 MG PO TABS Oral Take by mouth daily.      Marland Kitchen LOSARTAN POTASSIUM 50 MG PO TABS Oral Take 50 mg by mouth daily.      Marland Kitchen METFORMIN HCL 500 MG PO TABS Oral Take 500 mg by mouth 2 (two) times daily with a meal.      . METOPROLOL TARTRATE PO Oral  Take 50 mg by mouth 2 (two) times daily.     . MULTIVITAMIN PO Oral Take by mouth.      Marland Kitchen FISH OIL 1200 MG PO CAPS Oral Take by mouth.      Marland Kitchen SIMVASTATIN 40 MG PO TABS Oral Take 40 mg by mouth at bedtime.      . SOLIFENACIN SUCCINATE 5 MG PO TABS Oral Take 5 mg by mouth.    Marland Kitchen VITAMIN C 500 MG PO TABS Oral Take 500 mg by mouth daily.        BP 158/71  Pulse 127  Temp(Src) 98.3 F (36.8 C) (Oral)  Resp 16  SpO2 95%  Physical Exam  Nursing note and vitals reviewed. Constitutional: She is oriented to person, place, and time. She appears well-developed and well-nourished. No distress.  HENT:  Head: Normocephalic and atraumatic.  Eyes: Conjunctivae and EOM are normal.  Cardiovascular: Normal rate and regular rhythm.   Pulmonary/Chest: Effort normal and breath sounds normal. No stridor. No respiratory distress.  Abdominal: She exhibits no distension.  Musculoskeletal: She exhibits no edema.  Neurological: She is alert and oriented to person, place, and time. No cranial nerve deficit.  Skin: Skin is warm and dry.  Psychiatric: She has a normal mood and affect.    ED Course  Procedures  (including critical care time)   Labs Reviewed  COMPREHENSIVE METABOLIC PANEL  CBC  TROPONIN I  PROTIME-INR   No results found.   No diagnosis found.  Cardiac monitor 141 atrial fibrillation with rapid ventricular response, abnormal Pulse oximetry 100%, 2 L nasal cannula, abnormal  Date: 05/24/2011  Rate: 135  Rhythm: atrial fibrillation  QRS Axis: normal  Intervals: normal  ST/T Wave abnormalities: indeterminate  Conduction Disutrbances:indet'  Narrative Interpretation:   Old EKG Reviewed: changes noted  ABNORMAL ECG  CXR reviewed by me: no acute findings    MDM  This elderly female with history of atrial fibrillation, as well as COPD now presents with tachycardia from a rehabilitation appointment.  On arrival the patient is in no distress, though he is tachycardic, with A. fib and rapid ventricular response.  The patient's laboratory evaluation is also notable for anemia.  Her hemoglobin today is a half that it was one year ago.  Given these findings, the patient was provided both blood products and diltiazem.  She remained stable throughout her emergency department visit, with a decrease in her heart rate.  She was admitted in stable condition.  CRITICAL CARE Performed by: Gerhard Munch   Total critical care time: 35  Critical care time was exclusive of separately billable procedures and treating other patients.  Critical care was necessary to treat or prevent imminent or life-threatening deterioration.  Critical care was time spent personally by me on the following activities: development of treatment plan with patient and/or surrogate as well as nursing, discussions with consultants, evaluation of patient's response to treatment, examination of patient, obtaining history from patient or surrogate, ordering and performing treatments and interventions, ordering and review of laboratory studies, ordering and review of radiographic studies, pulse oximetry and  re-evaluation of patient's condition.         Gerhard Munch, MD 05/24/11 2012

## 2011-05-24 NOTE — ED Notes (Signed)
Resting quietly on stretcher with daughter at bedside; pt and daughter report pt has known history of a fib (currently taking Dig and Metoprolol at home for same); ccm remains atrial fib rate b/w 82-104

## 2011-05-24 NOTE — ED Notes (Signed)
Attempted to call report and because the cardizem is titrated they can not take this patient.

## 2011-05-24 NOTE — Telephone Encounter (Signed)
Erin Vang from cardiac rehab phoned.  Patient there and is having rapid afib heart rate 120-150.  Advised  Dr. Patty Sermons at hospital today, she is calling [name]

## 2011-05-24 NOTE — ED Notes (Signed)
Pt. Was at pulmonary rehab and was exercising and went into a new A-fib,  Rate of 150, sob present, hx of COPD, denies chest pain,  Pt. Is in NAD

## 2011-05-24 NOTE — ED Notes (Addendum)
Ccm remains a fib after cardizem bolus; however, rate now 88-104; skin remains pink, warm, dry; no complaints at this time

## 2011-05-24 NOTE — Progress Notes (Signed)
Erin Vang came to Pulmonary Rehab today , check in vital signs BP 126/64, HR 77, O2 sat 98% RA.  Tolerated exercise well, maximum heart rate during exercise 85, BP114/80.  At check out she was noted to have heart rate 151, she was placed on Zoll monitor and found to be in atrial fibrillation.[facility] notified and we were directed to ED.  She had CBG checks today pre exercise 166, post exercise 129.  She was asymptomatic with this event.  She was transported to ED via wheelchair on Zoll monitor.  Patients daughter was notified.

## 2011-05-24 NOTE — ED Notes (Signed)
2610-01 Ready 

## 2011-05-24 NOTE — ED Notes (Signed)
Ccm showing afib with rvr - rate b/w 128-154; skin pink, warm, dry; no complaints at this time; daughter at bedside

## 2011-05-24 NOTE — H&P (Signed)
PCP:   Kimber Relic, MD, MD   Chief Complaint:  Palpitations  HPI: 76 year old female who came to the hospital after patient had palpitations. Patient was found to be in atrial fibrillation with rapid ventricular response, patient also found to be anemic. Patient's hemoglobin has dropped from 13.8 in April 2011 to 7.6 today. Patient hasn't chest pain no shortness of breath no nausea vomiting or diarrhea no fever no dysuria. Patient takes aspirin has not been on Coumadin in the past. Patient had colonoscopy in past which was negative.  Allergies:   Allergies  Allergen Reactions  . Erythromycin Hives  . Macrobid Other (See Comments)    Unknown   . Nitrofurantoin Other (See Comments)    Unknown   . Wool Alcohol (Lanolin Alcohol) Hives  . Penicillins Hives and Rash  . Sulfonamide Derivatives Hives and Rash  . Triple Antibiotic Rash      Past Medical History  Diagnosis Date  . Hypertension   . Diabetes mellitus   . Arrhythmia   . Osteoarthritis   . Hypercholesterolemia   . Personal history of atrial fibrillation   . Syncope and collapse     When patient stands up real quickly    Past Surgical History  Procedure Date  . Gallbladder surgery   . Appendectomy     age 18  . Cervical spine surgery jan 2011    Prior to Admission medications   Medication Sig Start Date End Date Taking? Authorizing Provider  albuterol (PROVENTIL HFA;VENTOLIN HFA) 108 (90 BASE) MCG/ACT inhaler Inhale 2 puffs into the lungs every 6 (six) hours as needed. As needed for shortness of breath.   Yes Historical Provider, MD  aspirin 81 MG tablet Take 81 mg by mouth every evening.    Yes Historical Provider, MD  Calcium Carbonate (CALCIUM 600 PO) Take by mouth daily.    Yes Historical Provider, MD  Cholecalciferol (VITAMIN D PO) Take 2,000 mg by mouth daily.     Yes Historical Provider, MD  digoxin (LANOXIN) 0.125 MG tablet Take 125 mcg by mouth daily before breakfast.    Yes Historical Provider, MD    Flaxseed, Linseed, (FLAX SEED OIL) 1000 MG CAPS Take 1,000 mg by mouth 3 (three) times daily. Takes 3 daily   Yes Historical Provider, MD  glipiZIDE (GLUCOTROL) 5 MG tablet Take 2.5 mg by mouth daily.    Yes Historical Provider, MD  Glucosamine-Chondroitin (RA GLUCOSAMINE-CHONDROITIN) 750-600 MG TABS Take by mouth daily.     Yes Historical Provider, MD  losartan (COZAAR) 50 MG tablet Take 50 mg by mouth 2 (two) times daily.    Yes Historical Provider, MD  metFORMIN (GLUCOPHAGE) 500 MG tablet Take 500 mg by mouth 2 (two) times daily with a meal.     Yes Historical Provider, MD  METOPROLOL TARTRATE PO Take 50 mg by mouth 2 (two) times daily.    Yes Historical Provider, MD  Multiple Vitamin (MULTIVITAMIN PO) Take 1 tablet by mouth daily after breakfast.    Yes Historical Provider, MD  Omega-3 Fatty Acids (FISH OIL) 1200 MG CAPS Take by mouth.     Yes Historical Provider, MD  simvastatin (ZOCOR) 40 MG tablet Take 40 mg by mouth every evening. Take with dinner.   Yes Historical Provider, MD  tolterodine (DETROL LA) 4 MG 24 hr capsule Take 4 mg by mouth daily.   Yes Historical Provider, MD  vitamin C (ASCORBIC ACID) 500 MG tablet Take 500 mg by mouth daily.  Yes Historical Provider, MD    Social History:  reports that she quit smoking about 25 years ago. Her smoking use included Cigarettes. She has a 70 pack-year smoking history. She has never used smokeless tobacco. She reports that she does not drink alcohol. Her drug history not on file.  Family History  Problem Relation Age of Onset  . Heart failure Mother   . Cancer Father     Review of Systems:  HEENT: Denies headache, blurred vision, runny nose, sore throat,  Neck: Denies thyroid problems,lymphadenopathy Chest : Denies shortness of breath,  COPD Heart : Denies Chest pain,  coronary arterey disease, positive for palpitations GI: Denies  nausea, vomiting, diarrhea, constipation GU: Denies dysuria, , frequency of urination,  hematuria Neuro: Denies stroke, seizures, syncope Psych: Denies depression, anxiety, hallucinations Extremities : No Cyanosis, Clubbing or Edema  Physical Exam: Blood pressure 157/66, pulse 135, temperature 98.3 F (36.8 C), temperature source Oral, resp. rate 19, SpO2 99.00%. Constitutional: Vital signs reviewed.  Patient is a well-developed and well-nourished female acute distress and cooperative with exam. Head: Normocephalic and atraumatic Mouth: Mucus membranes moist Eyes: PERRL, EOMI, conjunctivae normal Neck: Supple, No Thyromegaly Cardiovascular: RRR, S1 normal, S2 irregular  Pulmonary/Chest: CTAB, no wheezes, rales, or rhonchi Abdominal: Soft. Non-tender, non-distended, bowel sounds are normal, no masses, organomegaly, or guarding present.  Neurological: A&O x3, Strenght is normal and symmetric bilaterally, cranial nerve II-XII are grossly intact, no focal motor deficit, sensory intact to light touch bilaterally.     Labs on Admission:  Results for orders placed during the hospital encounter of 05/24/11 (from the past 48 hour(s))  COMPREHENSIVE METABOLIC PANEL     Status: Abnormal   Collection Time   05/24/11  4:21 PM      Component Value Range Comment   Sodium 141  135 - 145 (mEq/L)    Potassium 3.9  3.5 - 5.1 (mEq/L)    Chloride 104  96 - 112 (mEq/L)    CO2 24  19 - 32 (mEq/L)    Glucose, Bld 95  70 - 99 (mg/dL)    BUN 20  6 - 23 (mg/dL)    Creatinine, Ser 4.09  0.50 - 1.10 (mg/dL)    Calcium 81.1  8.4 - 10.5 (mg/dL)    Total Protein 7.3  6.0 - 8.3 (g/dL)    Albumin 3.8  3.5 - 5.2 (g/dL)    AST 23  0 - 37 (U/L)    ALT 18  0 - 35 (U/L)    Alkaline Phosphatase 65  39 - 117 (U/L)    Total Bilirubin 0.2 (*) 0.3 - 1.2 (mg/dL)    GFR calc non Af Amer 86 (*) >90 (mL/min)    GFR calc Af Amer >90  >90 (mL/min)   CBC     Status: Abnormal   Collection Time   05/24/11  4:21 PM      Component Value Range Comment   WBC 14.5 (*) 4.0 - 10.5 (K/uL)    RBC 2.50 (*) 3.87 - 5.11  (MIL/uL)    Hemoglobin 7.6 (*) 12.0 - 15.0 (g/dL)    HCT 91.4 (*) 78.2 - 46.0 (%)    MCV 84.8  78.0 - 100.0 (fL)    MCH 30.4  26.0 - 34.0 (pg)    MCHC 35.8  30.0 - 36.0 (g/dL)    RDW 95.6 (*) 21.3 - 15.5 (%)    Platelets 261  150 - 400 (K/uL)   PROTIME-INR     Status:  Normal   Collection Time   05/24/11  4:21 PM      Component Value Range Comment   Prothrombin Time 12.5  11.6 - 15.2 (seconds)    INR 0.91  0.00 - 1.49    TROPONIN I     Status: Normal   Collection Time   05/24/11  4:24 PM      Component Value Range Comment   Troponin I <0.30  <0.30 (ng/mL)   PREPARE RBC (CROSSMATCH)     Status: Normal   Collection Time   05/24/11  5:37 PM      Component Value Range Comment   Order Confirmation NO CURRENT SAMPLE, MUST ORDER TYPE AND SCREEN     TYPE AND SCREEN     Status: Normal (Preliminary result)   Collection Time   05/24/11  6:40 PM      Component Value Range Comment   ABO/RH(D) A POS      Antibody Screen NEG      Sample Expiration 05/27/2011      Unit Number [id number]      Blood Component Type RED CELLS,LR      Unit division 00      Status of Unit ALLOCATED      Transfusion Status OK TO TRANSFUSE      Crossmatch Result Compatible      Unit Number [id number]      Blood Component Type RED CELLS,LR      Unit division 00      Status of Unit ALLOCATED      Transfusion Status OK TO TRANSFUSE      Crossmatch Result Compatible     ABO/RH     Status: Normal   Collection Time   05/24/11  6:40 PM      Component Value Range Comment   ABO/RH(D) A POS       Radiological Exams on Admission: Dg Chest 2 View  05/24/2011  *RADIOLOGY REPORT*    IMPRESSION: The semen without acute cardiopulmonary disease.     Assessment/Plan  Atrial fibrillation with rapid response Patient will be on Cardizem drip, will also continue metoprolol,digoxin. We'll continue aspirin.  Anemia Will check anemia panel, will give 2 units of blood transfusion.  Leukocytosis Patient doesn't appear to have  infectious process at this time. This could be reactive we'll follow CBC the morning  Diabetes mellitus We'll start sliding scale insulin  DVT prophylaxis SCD's  Time Spent on Admission: 55 min  Erin Vang S [facility] Pager: [phone] 05/24/2011, 8:18 PM

## 2011-05-25 ENCOUNTER — Encounter (HOSPITAL_COMMUNITY): Payer: Self-pay | Admitting: Gastroenterology

## 2011-05-25 DIAGNOSIS — K5909 Other constipation: Secondary | ICD-10-CM | POA: Diagnosis present

## 2011-05-25 DIAGNOSIS — F329 Major depressive disorder, single episode, unspecified: Secondary | ICD-10-CM | POA: Diagnosis present

## 2011-05-25 DIAGNOSIS — R001 Bradycardia, unspecified: Secondary | ICD-10-CM | POA: Diagnosis present

## 2011-05-25 DIAGNOSIS — I059 Rheumatic mitral valve disease, unspecified: Secondary | ICD-10-CM

## 2011-05-25 DIAGNOSIS — R630 Anorexia: Secondary | ICD-10-CM | POA: Diagnosis present

## 2011-05-25 DIAGNOSIS — I4891 Unspecified atrial fibrillation: Principal | ICD-10-CM

## 2011-05-25 DIAGNOSIS — IMO0001 Reserved for inherently not codable concepts without codable children: Secondary | ICD-10-CM | POA: Insufficient documentation

## 2011-05-25 DIAGNOSIS — D649 Anemia, unspecified: Secondary | ICD-10-CM | POA: Diagnosis present

## 2011-05-25 DIAGNOSIS — R634 Abnormal weight loss: Secondary | ICD-10-CM | POA: Diagnosis present

## 2011-05-25 LAB — TSH: TSH: 4.132 u[IU]/mL (ref 0.350–4.500)

## 2011-05-25 LAB — BASIC METABOLIC PANEL
BUN: 16 mg/dL (ref 6–23)
CO2: 23 mEq/L (ref 19–32)
Chloride: 105 mEq/L (ref 96–112)
GFR calc Af Amer: >90 mL/min (ref >90)
Potassium: 3.8 mEq/L (ref 3.5–5.1)

## 2011-05-25 LAB — GLUCOSE, CAPILLARY
Glucose-Capillary: 172 mg/dL — ABNORMAL HIGH (ref 70–99)
Glucose-Capillary: 135 mg/dL — ABNORMAL HIGH (ref 70–99)

## 2011-05-25 LAB — HEMOGLOBIN A1C: Hgb A1c MFr Bld: 6.3 % — ABNORMAL HIGH (ref <5.7)

## 2011-05-25 LAB — FERRITIN: Ferritin: 112 ng/mL (ref 10–291)

## 2011-05-25 LAB — CBC
HCT: 45.2 % (ref 36.0–46.0)
Hemoglobin: 16.1 g/dL — ABNORMAL HIGH (ref 12.0–15.0)
MCV: 84.5 fL (ref 78.0–100.0)
WBC: 8.2 10*3/uL (ref 4.0–10.5)

## 2011-05-25 MED ORDER — DIGOXIN 0.0625 MG HALF TABLET
0.0625 mg | ORAL_TABLET | Freq: Every day | ORAL | Status: DC
  Administered 2011-05-25 – 2011-05-27 (×3): 0.0625 mg via ORAL
  Filled 2011-05-25 (×4): qty 1

## 2011-05-25 MED ORDER — PNEUMOCOCCAL VAC POLYVALENT 25 MCG/0.5ML IJ INJ
0.5000 mL | INJECTION | INTRAMUSCULAR | Status: AC
  Administered 2011-05-26: 0.5 mL via INTRAMUSCULAR
  Filled 2011-05-25: qty 0.5

## 2011-05-25 MED ORDER — DOCUSATE SODIUM 100 MG PO CAPS
100.0000 mg | ORAL_CAPSULE | Freq: Two times a day (BID) | ORAL | Status: DC
  Administered 2011-05-25 – 2011-05-27 (×4): 100 mg via ORAL
  Filled 2011-05-25 (×5): qty 1

## 2011-05-25 MED ORDER — POLYETHYLENE GLYCOL 3350 17 G PO PACK
17.0000 g | PACK | Freq: Every day | ORAL | Status: DC
  Administered 2011-05-26 – 2011-05-27 (×2): 17 g via ORAL
  Filled 2011-05-25 (×3): qty 1

## 2011-05-25 MED ORDER — DRONABINOL 2.5 MG PO CAPS
2.5000 mg | ORAL_CAPSULE | Freq: Every day | ORAL | Status: DC
  Administered 2011-05-26: 2.5 mg via ORAL
  Filled 2011-05-25 (×2): qty 1

## 2011-05-25 MED ORDER — POLYETHYLENE GLYCOL 3350 17 G PO PACK
17.0000 g | PACK | Freq: Every day | ORAL | Status: DC
  Administered 2011-05-25: 17 g via ORAL
  Filled 2011-05-25 (×2): qty 1

## 2011-05-25 NOTE — Consult Note (Signed)
CARDIOLOGY CONSULT NOTE  [name] MRN: [id number], DOB/AGE: May 11, 1935   Admit date: 05/24/2011 Date of Consult: 05/25/2011   Primary Physician: Kimber Relic, MD, MD Primary Cardiologist: Wylene Simmer  Pt. Profile:   76 y/o female with h/o PAF who was admitted with recurrent a.fib.  Problem List: Past Medical History  Diagnosis Date  . Hypertension   . Diabetes mellitus   . Arrhythmia   . Osteoarthritis   . Hypercholesterolemia   . Paroxysmal atrial fibrillation     CHADS2: 2  . Syncope and collapse     When patient stands up real quickly  . Normal cardiac stress test     a.  lexiscan MV - EF 72%, No ischemia  . Anemia     ?  Marland Kitchen COPD (chronic obstructive pulmonary disease)     Past Surgical History  Procedure Date  . Gallbladder surgery   . Appendectomy     age 22  . Cervical spine surgery jan 2011     Allergies:  Allergies  Allergen Reactions  . Erythromycin Hives  . Macrobid Other (See Comments)    Unknown   . Nitrofurantoin Other (See Comments)    Unknown   . Wool Alcohol (Lanolin Alcohol) Hives  . Penicillins Hives and Rash  . Sulfonamide Derivatives Hives and Rash  . Triple Antibiotic Rash    HPI:   76 y/o female with the above problem list.  She has a h/o PAF prev managed with lopressor, digoxin, and asa.  CHADS2 = 2 however pt has been reluctant to consider coumadin in the past.  Her husband died within the past 7 months and since she has been quite distraught.  She notes generalized malaise and lack of energy, which she attributes to her grieving.  Further, she notes that most nights of the week, she becomes quite emotional @ bedtime and during these episodes, she notes tachypalpitations.  The palps/emotional upset lasts about an hr and eventually she dozes off to sleep.  She generally does not have palps during the day.  Yesterday, pt was @ pulmonary rehab and just before she was about to leave, she noted tachypalpitations.  Her HR  was checked and found to be 150.  ECG showed afib.  Our office was called with rec to send to ED given.  Pt was sent to ED and subsequently admitted by IM.  She was initially placed on IV dilt with rate control achieved after titration to 15mg /hr but was then also noted to have multiple pauses > 2.5 seconds (asymptomatic).  Dilt was d/c'd and her home dose of digoxin was reduced.  She remains in a.fib today and is asymptomatic.  She received reduce dosed digoxin this am and just received 50mg  of lopressor.  Despite being off of Dilt, she cont to have 2-4 second pauses (asymptomatic).   Confounding things, pt was found to be anemic with a H/H of 7.6/21.2.  She was transfused with 2 units of PRBC's and subsequent H/H = 16.1/45.2 - drawing into question the accuracy of the original H/H.  She has been seen by GI and they have noted from PCP records that Hgb was 13 in December.  She is guaiac negative. She is tentatively planned for EGD next week as an outpt.   Inpatient Medications:     . aspirin EC  81 mg Oral Daily  . digoxin  0.0625 mg Oral QAC breakfast  . docusate sodium  100 mg Oral BID  .  dronabinol  2.5 mg Oral QHS  . insulin aspart  0-5 Units Subcutaneous QHS  . insulin aspart  0-9 Units Subcutaneous TID WC  . losartan  50 mg Oral BID  . metoprolol  50 mg Oral BID  . pantoprazole (PROTONIX) IV  40 mg Intravenous Q12H  . pneumococcal 23 valent vaccine  0.5 mL Intramuscular Tomorrow-1000  . polyethylene glycol  17 g Oral Daily  . rosuvastatin  10 mg Oral q1800  . sodium chloride  3 mL Intravenous Q12H  . tolterodine  4 mg Oral Daily    Family History  Problem Relation Age of Onset  . Heart failure Mother     Died 50  . Cancer Father     Died 18     History   Social History  . Marital Status: Married    Spouse Name: N/A    Number of Children: N/A  . Years of Education: N/A   Occupational History  . Not on file.   Social History Main Topics  . Smoking status: Former  Smoker -- 2.0 packs/day for 35 years    Types: Cigarettes    Quit date: 04/23/1986  . Smokeless tobacco: Never Used  . Alcohol Use: No  . Drug Use: No  . Sexually Active: Not Currently   Other Topics Concern  . Not on file   Social History Narrative   Widowed in 2012.  Lives alone in [place].  Independent @ home.  Has been struggling with loss of husband.  Dtr near by.  Exercises in pulmonary rehab 2x/wk.     Review of Systems: General: negative for chills, fever, night sweats or weight changes.  Cardiovascular: +++palpitaitons as outlined above.  negative for chest pain, edema, orthopnea, paroxysmal nocturnal dyspnea.  She has been having dizziness with standing for about a year or more.  No syncope. Dermatological: negative for rash Respiratory: negative for cough or wheezing.  Some degree of chronic doe - followed closely by pulm - in pulm rehab. Urologic: negative for hematuria Abdominal: negative for nausea, vomiting, diarrhea, bright red blood per rectum, melena, or hematemesis Neurologic: negative for visual changes. Psych:  Grieving/depressed.  Becomes emotional easily - cried during interview. All other systems reviewed and are otherwise negative except as noted above.  Physical Exam: Blood pressure 150/76, pulse 76, temperature 97.5 F (36.4 C), temperature source Oral, resp. rate 13, height 5\' 5"  (1.651 m), weight 129 lb 6.6 oz (58.7 kg), SpO2 97.00%.  General: Well developed, well nourished, in no acute distress. Head: Normocephalic, atraumatic, sclera non-icteric, no xanthomas, nares are without discharge.  Neck: Supple without bruits or JVD. Lungs:  Resp regular and unlabored, CTA. Heart: IRIR no s3, s4, or murmurs. Abdomen: Soft, non-tender, non-distended, BS + x 4.  Msk:  Strength and tone appears normal for age. Extremities: No clubbing, cyanosis or edema. DP/PT/Radials 2+ and equal bilaterally. Neuro: Alert and oriented X 3. Moves all extremities  spontaneously. Psych: Normal affect.   Labs:   Results for orders placed during the hospital encounter of 05/24/11 (from the past 72 hour(s))  COMPREHENSIVE METABOLIC PANEL     Status: Abnormal   Collection Time   05/24/11  4:21 PM      Component Value Range Comment   Sodium 141  135 - 145 (mEq/L)    Potassium 3.9  3.5 - 5.1 (mEq/L)    Chloride 104  96 - 112 (mEq/L)    CO2 24  19 - 32 (mEq/L)    Glucose,  Bld 95  70 - 99 (mg/dL)    BUN 20  6 - 23 (mg/dL)    Creatinine, Ser 1.61  0.50 - 1.10 (mg/dL)    Calcium 09.6  8.4 - 10.5 (mg/dL)    Total Protein 7.3  6.0 - 8.3 (g/dL)    Albumin 3.8  3.5 - 5.2 (g/dL)    AST 23  0 - 37 (U/L)    ALT 18  0 - 35 (U/L)    Alkaline Phosphatase 65  39 - 117 (U/L)    Total Bilirubin 0.2 (*) 0.3 - 1.2 (mg/dL)    GFR calc non Af Amer 86 (*) >90 (mL/min)    GFR calc Af Amer >90  >90 (mL/min)   CBC     Status: Abnormal   Collection Time   05/24/11  4:21 PM      Component Value Range Comment   WBC 14.5 (*) 4.0 - 10.5 (K/uL)    RBC 2.50 (*) 3.87 - 5.11 (MIL/uL)    Hemoglobin 7.6 (*) 12.0 - 15.0 (g/dL)    HCT 04.5 (*) 40.9 - 46.0 (%)    MCV 84.8  78.0 - 100.0 (fL)    MCH 30.4  26.0 - 34.0 (pg)    MCHC 35.8  30.0 - 36.0 (g/dL)    RDW 81.1 (*) 91.4 - 15.5 (%)    Platelets 261  150 - 400 (K/uL)   PROTIME-INR     Status: Normal   Collection Time   05/24/11  4:21 PM      Component Value Range Comment   Prothrombin Time 12.5  11.6 - 15.2 (seconds)    INR 0.91  0.00 - 1.49    TROPONIN I     Status: Normal   Collection Time   05/24/11  4:24 PM      Component Value Range Comment   Troponin I <0.30  <0.30 (ng/mL)   PREPARE RBC (CROSSMATCH)     Status: Normal   Collection Time   05/24/11  5:37 PM      Component Value Range Comment   Order Confirmation NO CURRENT SAMPLE, MUST ORDER TYPE AND SCREEN     TYPE AND SCREEN     Status: Normal (Preliminary result)   Collection Time   05/24/11  6:40 PM      Component Value Range Comment   ABO/RH(D) A POS       Antibody Screen NEG      Sample Expiration 05/27/2011      Unit Number [id number]      Blood Component Type RED CELLS,LR      Unit division 00      Status of Unit ISSUED,FINAL      Transfusion Status OK TO TRANSFUSE      Crossmatch Result Compatible      Unit Number [id number]      Blood Component Type RED CELLS,LR      Unit division 00      Status of Unit ISSUED      Transfusion Status OK TO TRANSFUSE      Crossmatch Result Compatible     ABO/RH     Status: Normal   Collection Time   05/24/11  6:40 PM      Component Value Range Comment   ABO/RH(D) A POS     VITAMIN B12     Status: Normal   Collection Time   05/24/11  8:45 PM      Component Value Range Comment   Vitamin B-12  660  211 - 911 (pg/mL)   FOLATE     Status: Normal   Collection Time   05/24/11  8:45 PM      Component Value Range Comment   Folate >20.0     IRON AND TIBC     Status: Normal   Collection Time   05/24/11  8:45 PM      Component Value Range Comment   Iron 83  42 - 135 (ug/dL)    TIBC 161  096 - 045 (ug/dL)    Saturation Ratios 24  20 - 55 (%)    UIBC 268  125 - 400 (ug/dL)   FERRITIN     Status: Normal   Collection Time   05/24/11  8:45 PM      Component Value Range Comment   Ferritin 112  10 - 291 (ng/mL)   RETICULOCYTES     Status: Normal   Collection Time   05/24/11  8:45 PM      Component Value Range Comment   Retic Ct Pct 1.0  0.4 - 3.1 (%)    RBC. 4.68  3.87 - 5.11 (MIL/uL)    Retic Count, Manual 46.8  19.0 - 186.0 (K/uL)   HEMOGLOBIN A1C     Status: Abnormal   Collection Time   05/24/11  8:45 PM      Component Value Range Comment   Hemoglobin A1C 6.3 (*) <5.7 (%)    Mean Plasma Glucose 134 (*) <117 (mg/dL)   MRSA PCR SCREENING     Status: Normal   Collection Time   05/24/11  9:54 PM      Component Value Range Comment   MRSA by PCR NEGATIVE  NEGATIVE    GLUCOSE, CAPILLARY     Status: Abnormal   Collection Time   05/24/11 10:33 PM      Component Value Range Comment   Glucose-Capillary 131  (*) 70 - 99 (mg/dL)    Comment 1 Documented in Chart      Comment 2 Notify RN     GLUCOSE, CAPILLARY     Status: Abnormal   Collection Time   05/25/11  7:44 AM      Component Value Range Comment   Glucose-Capillary 135 (*) 70 - 99 (mg/dL)   TSH     Status: Normal   Collection Time   05/25/11  9:01 AM      Component Value Range Comment   TSH 4.132  0.350 - 4.500 (uIU/mL)   BASIC METABOLIC PANEL     Status: Abnormal   Collection Time   05/25/11  9:01 AM      Component Value Range Comment   Sodium 142  135 - 145 (mEq/L)    Potassium 3.8  3.5 - 5.1 (mEq/L)    Chloride 105  96 - 112 (mEq/L)    CO2 23  19 - 32 (mEq/L)    Glucose, Bld 138 (*) 70 - 99 (mg/dL)    BUN 16  6 - 23 (mg/dL)    Creatinine, Ser 4.09  0.50 - 1.10 (mg/dL)    Calcium 9.9  8.4 - 10.5 (mg/dL)    GFR calc non Af Amer 84 (*) >90 (mL/min)    GFR calc Af Amer >90  >90 (mL/min)   CBC     Status: Abnormal   Collection Time   05/25/11  9:01 AM      Component Value Range Comment   WBC 8.2  4.0 - 10.5 (K/uL) WHITE COUNT CONFIRMED  ON SMEAR   RBC 5.35 (*) 3.87 - 5.11 (MIL/uL)    Hemoglobin 16.1 (*) 12.0 - 15.0 (g/dL)    HCT 16.1  09.6 - 04.5 (%)    MCV 84.5  78.0 - 100.0 (fL)    MCH 30.1  26.0 - 34.0 (pg)    MCHC 35.6  30.0 - 36.0 (g/dL)    RDW 40.9  81.1 - 91.4 (%)    Platelets    150 - 400 (K/uL)    Value: PLATELET CLUMPS NOTED ON SMEAR, COUNT APPEARS ADEQUATE  LACTATE DEHYDROGENASE     Status: Normal   Collection Time   05/25/11  9:01 AM      Component Value Range Comment   LD 179  94 - 250 (U/L)   OCCULT BLOOD X 1 CARD TO LAB, STOOL     Status: Normal   Collection Time   05/25/11 11:13 AM      Component Value Range Comment   Fecal Occult Bld NEGATIVE     GLUCOSE, CAPILLARY     Status: Abnormal   Collection Time   05/25/11 12:35 PM      Component Value Range Comment   Glucose-Capillary 172 (*) 70 - 99 (mg/dL)    Comment 1 Notify RN       Radiology/Studies: Dg Chest 2 View  05/24/2011  *RADIOLOGY REPORT*  Clinical  Data: Irregular heart rate.  Weakness.  CHEST - 2 VIEW  Comparison: 09/11/2010.  Findings: Emphysema.  Cardiopericardial silhouette appears within normal limits.  There is no airspace disease or effusion. Cholecystectomy clips are present in the right upper quadrant. Monitoring leads are projected over the chest.  IMPRESSION: The semen without acute cardiopulmonary disease.  Original Report Authenticated By: Andreas Newport, M.D.    EKG:  A.Fib 135, inflat st depression  ASSESSMENT AND PLAN:   1.  PAF:  Pt admitted with recurrent PAF that developed yesterday in pulmonary clinic.  Pt has had palpitations @ home nightly, though it's not clear if this represented a.fib or not.  She went in to a.fib yesterday while @ pulmonary rehab and is currently rate controlled - off of Dilt.  She has cont to have intermittent pauses - as long as 4.19 seconds (occurred approx 6 hrs after IV dilt was d/c'd).  All pauses have been asymptomatic.  Pauses are concerning for tachybrady syndrome.  We will d/c her Digoxin altogether and cont BB @ current dose.  If she has either longer/symptomatic pauses or more rapid a.fib, we will need to make a decision re: PPM placement and med titration.  TSH/Lytes ok.  Check echo. From an anticoagulation standpoint, pt's CHADS2 score is 2 (htn, dm).  She does not want coumadin but is interested in an alternate agent.  That said, until her anemia/gi workup is complete we would not want to start anticoagulation just yet.  If GI w/u is negative, we will plan to add pradaxa, and if she remains in a.fib,  would plan on outpt TEE/DCCV after 3 wks of pradaxa therapy.   2.  Anemia:  See above.  Per GI.  Holding off on anticoagulation.   Signed, Nicolasa Ducking, NP 05/25/2011, 2:50 PM   History reviewed with the patient, no changes to be made.  She has had paroxysmal afib.  She is currently in sustained afib with tachybrady.  Complicating this is a vague anemia and an ongoing work up for  this with possible GI source. The patient exam reveals lungs clear, COR irregular, Ext  no edema. .  All available labs, radiology testing, previous records reviewed. Agree with documented assessment and plan. For now we can only pursue rate control without ASA or anticoagulation.  I would hold the digoxin and reduce the beta blocker as above.  Once cleared by GI she can start Xarelto (she does not want warfarin).  She might then need DCCV with or without TEE depending on the clinical situation at that time.  All of the many possible scenarios have been discussed with her.  Fayrene Fearing Ronak Duquette  5:02 PM 03/09/2011

## 2011-05-25 NOTE — Progress Notes (Signed)
[facility]   Subjective: Today she denies any issues regarding chest pain or shortness of breath. She states when she does have rapid heart rate she is aware of the tachypalpitations. She also endorses intermittent issues with dizziness while standing. Had an extensive discussion with the patient and her daughter. Both indoor that patient has been having issues with weight loss of 30 pounds over the past 2 years. This issue has been further exacerbated by the recent death of the patient's husband 7 months ago. She has had extensive issues with depression and worsening anorexia since that time. She's had a colonoscopy 7 years ago and does not have any  personal or family history of gastrointestinal problems or malignancy. She recently had a complete gynecological exam in the past month which demonstrated no abnormalities. During our discussion with the patient she became tearful when discussing her deceased husband. She endorsed they were married for 58 years and since the death of her husband she has been unable to sleep at night and has not had much success with the prescribed sleep aids from her primary care physician.  Objective: Vital signs in last 24 hours: Temp:  [97.5 F (36.4 C)-98.5 F (36.9 C)] 97.5 F (36.4 C) (02/01 1259) Pulse Rate:  [47-135] 76  (02/01 1259) Resp:  [12-20] 13  (02/01 1259) BP: (107-158)/(52-116) 150/76 mmHg (02/01 1259) SpO2:  [95 %-100 %] 97 % (02/01 1259) Weight:  [58 kg (127 lb 13.9 oz)-58.7 kg (129 lb 6.6 oz)] 58.7 kg (129 lb 6.6 oz) (02/01 0105) Weight change:  Last BM Date:  (pt stated that she defecates 3 x a week)  Intake/Output from previous day: 01/31 0701 - 02/01 0700 In: 700 [Blood:700] Out: 1150 [Urine:1150] Intake/Output this shift: Total I/O In: 3 [I.V.:3] Out: -   General appearance: alert, cooperative, appears stated age and no distress Resp: clear to auscultation bilaterally Cardio: irregularly irregular rhythm and S1, S2  normal, atrial fibrillation with ventricular rates between 47 and 57 GI: soft, non-tender; bowel sounds normal; no masses,  no organomegaly Extremities: extremities normal, atraumatic, no cyanosis or edema Neurologic: Grossly normal Psych: Patient is clearly depressed based on clinical exam and her verbalized expression of depression; flat affect but is oriented; became tearful when discussing her deceased husband  Lab Results:  Basename 05/25/11 0901 05/24/11 1621  WBC 8.2 14.5*  HGB 16.1* 7.6*  HCT 45.2 21.2*  PLT PLATELET CLUMPS NOTED ON SMEAR, COUNT APPEARS ADEQUATE 261   BMET  Basename 05/25/11 0901 05/24/11 1621  NA 142 141  K 3.8 3.9  CL 105 104  CO2 23 24  GLUCOSE 138* 95  BUN 16 20  CREATININE 0.70 0.65  CALCIUM 9.9 10.5    Studies/Results: Dg Chest 2 View  05/24/2011  *RADIOLOGY REPORT*  Clinical Data: Irregular heart rate.  Weakness.  CHEST - 2 VIEW  Comparison: 09/11/2010.  Findings: Emphysema.  Cardiopericardial silhouette appears within normal limits.  There is no airspace disease or effusion. Cholecystectomy clips are present in the right upper quadrant. Monitoring leads are projected over the chest.  IMPRESSION: The semen without acute cardiopulmonary disease.  Original Report Authenticated By: Andreas Newport, M.D.    Medications:  I have reviewed the patient's current medications. Scheduled:   . aspirin EC  81 mg Oral Daily  . digoxin  0.0625 mg Oral QAC breakfast  . diltiazem  10 mg Intravenous Once  . docusate sodium  100 mg Oral BID  . dronabinol  2.5 mg Oral QHS  .  insulin aspart  0-5 Units Subcutaneous QHS  . insulin aspart  0-9 Units Subcutaneous TID WC  . losartan  50 mg Oral BID  . metoprolol  50 mg Oral BID  . pantoprazole (PROTONIX) IV  40 mg Intravenous Q12H  . pneumococcal 23 valent vaccine  0.5 mL Intramuscular Tomorrow-1000  . polyethylene glycol  17 g Oral Daily  . rosuvastatin  10 mg Oral q1800  . sodium chloride  3 mL Intravenous Q12H   . tolterodine  4 mg Oral Daily  . DISCONTD: sodium chloride   Intravenous STAT  . DISCONTD: aspirin  81 mg Oral QPM  . DISCONTD: digoxin  125 mcg Oral QAC breakfast  . DISCONTD: polyethylene glycol  17 g Oral Daily  . DISCONTD: simvastatin  40 mg Oral QPM    Assessment/Plan:  Principal Problem:  *Atrial fibrillation with RVR/ Bradycardia Unclear what precipitated episodic RVR. Patient was in pulmonary rehabilitation on the bicycle for her second visit and may be the exertion of this activity precipitated the event. Initially it was surmised that she was anemic and this precipitated the tachycardia but it appears as if the initial hemoglobin of 7.6 was a spurious result. Nonetheless, since admission she has been weaned off the Cardizem drip and remains on her usual digoxin and metoprolol. Our concern at this point she appears to be bradycardic with rates dipping as low as 39. I am concerned that she may be having symptomatic bradycardia at home and this is what has precipitated some of her lethargic he symptoms and may have been going on for some time and could be contributing to her ongoing anorexia and dizziness. I have asked cardiology to evaluate the patient. In the interim we have decreased her digoxin by half but continued her beta blockers until dilated the cardiology. She may be developing tachybradycardia syndrome.  Active Problems:  Anemia Initially she presented with a hemoglobin of 7.6 and given the fact she had tachycardia was felt she had symptomatic anemia and therefore was given 2 units of packed red blood cells. Her last documented hemoglobin available to her hospital records was 13 in 2011. Today after transfusion her hemoglobin has increased dramatically to 16.1 which raises question whether the initial result was accurate or not. Gastroenterology had been called in for consultation. They noticed similar trending in hemoglobin. They actually contacted the patient's primary care  physician and stated the patient had hemoglobin of 13 with a normal MCV in November 2012. In addition her anemia indices do not support a chronic progressive anemia and she has not had any visualized red blood or dark stools making it less likely she has a GI bleed. Her platelets are normal so is less likely she is hemolyzing but as a precaution we have checked an LDH and haptoglobin. We will also repeat a CBC in the morning to determine the true trend. We will also check stools for blood. If stools remain negative can certainly pursue additional outpatient evaluation. Gastroenterology is planning outpatient endoscopy next Thursday afternoon and patient will follow up with them in the office to confirm this appointment and repeat a CBC. They're asking if cardiology will clarify if it is okay to hold the patient's aspirin until the colonoscopy is to completed.   DIABETES MELLITUS, TYPE II CBGs have been stable between 131 and 172 since admission. Hemoglobin A1c is normal at 6.3. Continue as needed sliding scale insulin. At home she is on Glucotrol 2.5 mg daily and metformin 500 mg twice a  day. Given inadequate intake and low sugars here patient could also be experiencing symptomatic hypoglycemia and may not need to be on Glucotrol Norvasc metformin but instead follow glucoses and consider sliding scale insulin only after discharge.   ANXIETY/ Reactive depression This has been a significant problem since her husband passed away 7 months ago. Medication therapy has been offered by her primary care physician but she has refused. Apparently she has also refused psychotherapy through a formal counselor but is participating in hospice grief counseling. We did discuss with her the benefits of short-term antidepressant medications and improving the chemical changes of severe depression in the brain with plans to eventually remove these medications and titrate him off. At this time she is declining antidepressant  medication. She also endorses significant issues with insomnia since her husband died. The appetite stimulant we have ordered should cause some drowsiness to help her sleep at night.   HYPERTENSION Blood pressure is well-controlled currently on metoprolol and Cozaar.   Anorexia/Weight loss, non-intentional Both patient and daughter state that this was an issue prior to the husband's death so it is less likely this is psychogenic in nature. Suspect underlying chronic constipation could be contributing. Will check a prealbumin level. Primary care has been following this and she has also seen a nutritionist in the past. A prealbumin low enough to consider home health nutritional followup. States is utilizing Glucerna shakes at home but daughter endorses the patient still just does not eat anything. We will also start Marinol at hour of sleep very low dose to see if this helps.   COPD (chronic obstructive pulmonary disease) Not currently an active problem no wheezing or shortness of breath. Anticipate she will return to outpatient pulmonary rehabilitation when okay with cardiology.   Constipation, chronic We'll start MiraLAX once a day as well as Colace twice a day. Patient endorses has one bowel movement every 3 days which has been normal for her for quite some time. Likely poor appetite and low volume of ingested foods contributing to decreased transit time.  Disposition Likely can discharge to home tomorrow if stable from a cardiac standpoint. Will remain in the step down unit because of the issues with bradycardia and recent atrial fibrillation with RVR.   LOS: 1 day   Junious Silk, ANP pager [phone]  [facility] [email or web address] Password: [email or web address]  05/25/2011, 1:25 PM   I have examined the patient and reviewed the chart. I have modified the above note and agree with it.   Calvert Cantor, MD [phone]

## 2011-05-25 NOTE — Consult Note (Signed)
Reason for Consult: Symptomatic anemia in a patient on aspirin Referring Physician: Hospital team Erin Vang is an 76 y.o. female.  HPI: Patient well known to me from 2 previous screening colonoscopies the last one in 2010 which was normal who has had no GI symptoms and has not seen any blood but came in with a hemoglobin of 7 however no guaiac I think was done however after 2 units of blood her hemoglobin is 15 which makes U. question which 1 is truly the right 1. We called her primary Dr. [name] and her last hemoglobin diarrhea has 13 with a normal MCV in November. She does not use any extra aspirin or nonsteroidals and her family history is negative for any significant GI problems although her daughter had a hiatal hernia repair and she has no other complaint is eating a regular diet feels better and wants to go home  Past Medical History  Diagnosis Date  . Hypertension   . Diabetes mellitus   . Arrhythmia   . Osteoarthritis   . Hypercholesterolemia   . Personal history of atrial fibrillation   . Syncope and collapse     When patient stands up real quickly    Past Surgical History  Procedure Date  . Gallbladder surgery   . Appendectomy     age 89  . Cervical spine surgery jan 2011    Family History  Problem Relation Age of Onset  . Heart failure Mother   . Cancer Father     Social History:  reports that she quit smoking about 25 years ago. Her smoking use included Cigarettes. She has a 70 pack-year smoking history. She has never used smokeless tobacco. She reports that she does not drink alcohol. Her drug history not on file.  Allergies:  Allergies  Allergen Reactions  . Erythromycin Hives  . Macrobid Other (See Comments)    Unknown   . Nitrofurantoin Other (See Comments)    Unknown   . Wool Alcohol (Lanolin Alcohol) Hives  . Penicillins Hives and Rash  . Sulfonamide Derivatives Hives and Rash  . Triple Antibiotic Rash    Medications: I have reviewed the patient's  current medications.  Results for orders placed during the hospital encounter of 05/24/11 (from the past 48 hour(s))  COMPREHENSIVE METABOLIC PANEL     Status: Abnormal   Collection Time   05/24/11  4:21 PM      Component Value Range Comment   Sodium 141  135 - 145 (mEq/L)    Potassium 3.9  3.5 - 5.1 (mEq/L)    Chloride 104  96 - 112 (mEq/L)    CO2 24  19 - 32 (mEq/L)    Glucose, Bld 95  70 - 99 (mg/dL)    BUN 20  6 - 23 (mg/dL)    Creatinine, Ser 4.09  0.50 - 1.10 (mg/dL)    Calcium 81.1  8.4 - 10.5 (mg/dL)    Total Protein 7.3  6.0 - 8.3 (g/dL)    Albumin 3.8  3.5 - 5.2 (g/dL)    AST 23  0 - 37 (U/L)    ALT 18  0 - 35 (U/L)    Alkaline Phosphatase 65  39 - 117 (U/L)    Total Bilirubin 0.2 (*) 0.3 - 1.2 (mg/dL)    GFR calc non Af Amer 86 (*) >90 (mL/min)    GFR calc Af Amer >90  >90 (mL/min)   CBC     Status: Abnormal   Collection Time  05/24/11  4:21 PM      Component Value Range Comment   WBC 14.5 (*) 4.0 - 10.5 (K/uL)    RBC 2.50 (*) 3.87 - 5.11 (MIL/uL)    Hemoglobin 7.6 (*) 12.0 - 15.0 (g/dL)    HCT 96.0 (*) 45.4 - 46.0 (%)    MCV 84.8  78.0 - 100.0 (fL)    MCH 30.4  26.0 - 34.0 (pg)    MCHC 35.8  30.0 - 36.0 (g/dL)    RDW 09.8 (*) 11.9 - 15.5 (%)    Platelets 261  150 - 400 (K/uL)   PROTIME-INR     Status: Normal   Collection Time   05/24/11  4:21 PM      Component Value Range Comment   Prothrombin Time 12.5  11.6 - 15.2 (seconds)    INR 0.91  0.00 - 1.49    TROPONIN I     Status: Normal   Collection Time   05/24/11  4:24 PM      Component Value Range Comment   Troponin I <0.30  <0.30 (ng/mL)   PREPARE RBC (CROSSMATCH)     Status: Normal   Collection Time   05/24/11  5:37 PM      Component Value Range Comment   Order Confirmation NO CURRENT SAMPLE, MUST ORDER TYPE AND SCREEN     TYPE AND SCREEN     Status: Normal (Preliminary result)   Collection Time   05/24/11  6:40 PM      Component Value Range Comment   ABO/RH(D) A POS      Antibody Screen NEG       Sample Expiration 05/27/2011      Unit Number [id number]      Blood Component Type RED CELLS,LR      Unit division 00      Status of Unit ISSUED,FINAL      Transfusion Status OK TO TRANSFUSE      Crossmatch Result Compatible      Unit Number [id number]      Blood Component Type RED CELLS,LR      Unit division 00      Status of Unit ISSUED      Transfusion Status OK TO TRANSFUSE      Crossmatch Result Compatible     ABO/RH     Status: Normal   Collection Time   05/24/11  6:40 PM      Component Value Range Comment   ABO/RH(D) A POS     VITAMIN B12     Status: Normal   Collection Time   05/24/11  8:45 PM      Component Value Range Comment   Vitamin B-12 660  211 - 911 (pg/mL)   FOLATE     Status: Normal   Collection Time   05/24/11  8:45 PM      Component Value Range Comment   Folate >20.0     IRON AND TIBC     Status: Normal   Collection Time   05/24/11  8:45 PM      Component Value Range Comment   Iron 83  42 - 135 (ug/dL)    TIBC 696  295 - 284 (ug/dL)    Saturation Ratios 24  20 - 55 (%)    UIBC 268  125 - 400 (ug/dL)   FERRITIN     Status: Normal   Collection Time   05/24/11  8:45 PM      Component Value Range Comment  Ferritin 112  10 - 291 (ng/mL)   RETICULOCYTES     Status: Normal   Collection Time   05/24/11  8:45 PM      Component Value Range Comment   Retic Ct Pct 1.0  0.4 - 3.1 (%)    RBC. 4.68  3.87 - 5.11 (MIL/uL)    Retic Count, Manual 46.8  19.0 - 186.0 (K/uL)   HEMOGLOBIN A1C     Status: Abnormal   Collection Time   05/24/11  8:45 PM      Component Value Range Comment   Hemoglobin A1C 6.3 (*) <5.7 (%)    Mean Plasma Glucose 134 (*) <117 (mg/dL)   MRSA PCR SCREENING     Status: Normal   Collection Time   05/24/11  9:54 PM      Component Value Range Comment   MRSA by PCR NEGATIVE  NEGATIVE    GLUCOSE, CAPILLARY     Status: Abnormal   Collection Time   05/24/11 10:33 PM      Component Value Range Comment   Glucose-Capillary 131 (*) 70 - 99 (mg/dL)     Comment 1 Documented in Chart      Comment 2 Notify RN     GLUCOSE, CAPILLARY     Status: Abnormal   Collection Time   05/25/11  7:44 AM      Component Value Range Comment   Glucose-Capillary 135 (*) 70 - 99 (mg/dL)   BASIC METABOLIC PANEL     Status: Abnormal   Collection Time   05/25/11  9:01 AM      Component Value Range Comment   Sodium 142  135 - 145 (mEq/L)    Potassium 3.8  3.5 - 5.1 (mEq/L)    Chloride 105  96 - 112 (mEq/L)    CO2 23  19 - 32 (mEq/L)    Glucose, Bld 138 (*) 70 - 99 (mg/dL)    BUN 16  6 - 23 (mg/dL)    Creatinine, Ser 1.61  0.50 - 1.10 (mg/dL)    Calcium 9.9  8.4 - 10.5 (mg/dL)    GFR calc non Af Amer 84 (*) >90 (mL/min)    GFR calc Af Amer >90  >90 (mL/min)   CBC     Status: Abnormal   Collection Time   05/25/11  9:01 AM      Component Value Range Comment   WBC 8.2  4.0 - 10.5 (K/uL) WHITE COUNT CONFIRMED ON SMEAR   RBC 5.35 (*) 3.87 - 5.11 (MIL/uL)    Hemoglobin 16.1 (*) 12.0 - 15.0 (g/dL)    HCT 09.6  04.5 - 40.9 (%)    MCV 84.5  78.0 - 100.0 (fL)    MCH 30.1  26.0 - 34.0 (pg)    MCHC 35.6  30.0 - 36.0 (g/dL)    RDW 81.1  91.4 - 78.2 (%)    Platelets    150 - 400 (K/uL)    Value: PLATELET CLUMPS NOTED ON SMEAR, COUNT APPEARS ADEQUATE  LACTATE DEHYDROGENASE     Status: Normal   Collection Time   05/25/11  9:01 AM      Component Value Range Comment   LD 179  94 - 250 (U/L)     Dg Chest 2 View  05/24/2011  *RADIOLOGY REPORT*  Clinical Data: Irregular heart rate.  Weakness.  CHEST - 2 VIEW  Comparison: 09/11/2010.  Findings: Emphysema.  Cardiopericardial silhouette appears within normal limits.  There is no airspace disease or  effusion. Cholecystectomy clips are present in the right upper quadrant. Monitoring leads are projected over the chest.  IMPRESSION: The semen without acute cardiopulmonary disease.  Original Report Authenticated By: Andreas Newport, M.D.    ROS negative except above Blood pressure 142/80, pulse 71, temperature 97.7 F (36.5 C),  temperature source Oral, resp. rate 12, height 5\' 5"  (1.651 m), weight 58.7 kg (129 lb 6.6 oz), SpO2 97.00%. Physical Exam no acute distress vital signs stable afebrile lungs are clear heart is irregular but not tachycardic abdomen is soft nontender labs reviewed as above as well as previous colonoscopy  Assessment/Plan: Questionable symptomatic anemia versus lab error in a patient on aspirin Plan: I discussed inpatient versus outpatient workup and I offered her on Monday morning endoscopy as an outpatient but based on her schedule she prefers Thursday afternoon and she will call me on Monday to confirm and set up and I will repeat a CBC at that time and based on those findings we can decide further workup and plan and hopefully cardiology will be okay with holding aspirin until then . Please call me today or my partner this weekend if any further questions or problems  Anakin Varkey E 05/25/2011, 10:49 AM

## 2011-05-25 NOTE — Progress Notes (Signed)
  Echocardiogram 2D Echocardiogram has been performed.  Jemar Paulsen, Real Cons 05/25/2011, 5:59 PM

## 2011-05-25 NOTE — Progress Notes (Signed)
Patient had nonsustained bradycardia to 30 and shortly thereafter pause of 4.72 seconds on cardiazem drip at 15mg .  Patient was asymptomatic, BP 122/52.  Turned drip off and notified NP Maren Reamer.  Rhythm remains a-fib with rate 50's-60's.  Continuing to monitor patient.

## 2011-05-25 NOTE — Plan of Care (Signed)
Problem: Phase I Progression Outcomes Goal: Anticoagulation Therapy per MD order Outcome: Progressing Aspirin is currently on hold.  Problem: Phase II Progression Outcomes Goal: Tolerating diet Outcome: Progressing Poor appetite

## 2011-05-25 NOTE — Progress Notes (Signed)
Utilization Review Completed.Reef Achterberg T2/04/2011   

## 2011-05-25 NOTE — Progress Notes (Signed)
SPOKE WITH THE PT AND HER DAUGHTER ABOUT DC NEEDS, THEY HAVE NONE.  ATTENDING WOULD LIKE FOR PT TO HAVE NUTRITIONAL CONSULT.  PT AND DAUGHTER STATED THAT SHE GOES TO [facility] AND WOULD LIKE TO SEE THE NUTRITIONIST THERE.  WILL NOTIFY ATTENDING.  WILL F/U. Willa Rough [phone] OR [phone] 05/25/2011

## 2011-05-26 LAB — PREALBUMIN: Prealbumin: 24.5 mg/dL (ref 17.0–34.0)

## 2011-05-26 LAB — TYPE AND SCREEN
Antibody Screen: NEGATIVE
Unit division: 0

## 2011-05-26 LAB — GLUCOSE, CAPILLARY: Glucose-Capillary: 186 mg/dL — ABNORMAL HIGH (ref 70–99)

## 2011-05-26 LAB — HAPTOGLOBIN: Haptoglobin: 170 mg/dL (ref 30–200)

## 2011-05-26 NOTE — Progress Notes (Signed)
Pt converted to sinus rhythm at 1819.

## 2011-05-26 NOTE — Progress Notes (Addendum)
SUBJECTIVE:  Feeling well  OBJECTIVE:   Vitals:   Filed Vitals:   05/26/11 0812 05/26/11 0909 05/26/11 0956 05/26/11 1157  BP: 100/79  125/91 144/91  Pulse: 102  90 94  Temp: 97.1 F (36.2 C)   97.1 F (36.2 C)  TempSrc: Oral   Oral  Resp: 13   19  Height:      Weight:      SpO2:  97%  97%   I&O's:   Intake/Output Summary (Last 24 hours) at 05/26/11 1227 Last data filed at 05/26/11 0900  Gross per 24 hour  Intake    243 ml  Output   1400 ml  Net  -1157 ml   TELEMETRY: Reviewed telemetry pt in atrial fibrillation     PHYSICAL EXAM General: Well developed, well nourished, in no acute distress Head: Eyes PERRLA, No xanthomas.   Normal cephalic and atramatic  Lungs:   Clear bilaterally to auscultation and percussion. Heart:   Irregularly irregular S1 S2 Pulses are 2+ & equal.            No carotid bruit. No JVD.  No abdominal bruits. No femoral bruits. Abdomen: Bowel sounds are positive, abdomen soft and non-tender without masses Extremities:   No clubbing, cyanosis or edema.  DP +1 Neuro: Alert and oriented X 3. Psych:  Good affect, responds appropriately   LABS: Basic Metabolic Panel:  Basename 05/25/11 0901 05/24/11 1621  NA 142 141  K 3.8 3.9  CL 105 104  CO2 23 24  GLUCOSE 138* 95  BUN 16 20  CREATININE 0.70 0.65  CALCIUM 9.9 10.5  MG -- --  PHOS -- --   Liver Function Tests:  [facility] 05/24/11 1621  AST 23  ALT 18  ALKPHOS 65  BILITOT 0.2*  PROT 7.3  ALBUMIN 3.8    CBC:  Basename 05/25/11 0901 05/24/11 1621  WBC 8.2 14.5*  NEUTROABS -- --  HGB 16.1* 7.6*  HCT 45.2 21.2*  MCV 84.5 84.8  PLT PLATELET CLUMPS NOTED ON SMEAR, COUNT APPEARS ADEQUATE 261   Cardiac Enzymes:  Basename 05/24/11 1624  CKTOTAL --  CKMB --  CKMBINDEX --  TROPONINI <0.30   Hemoglobin A1C:  Basename 05/24/11 2045  HGBA1C 6.3*    Thyroid Function Tests:  Basename 05/25/11 0901  TSH 4.132  T4TOTAL --  T3FREE --  THYROIDAB --   Anemia  Panel:  Basename 05/24/11 2045  VITAMINB12 660  FOLATE >20.0  FERRITIN 112  TIBC 351  IRON 83  RETICCTPCT 1.0   Coag Panel:   Lab Results  Component Value Date   INR 0.91 05/24/2011   INR 1.02 04/27/2009    RADIOLOGY: Dg Chest 2 View  05/24/2011  *RADIOLOGY REPORT*  Clinical Data: Irregular heart rate.  Weakness.  CHEST - 2 VIEW  Comparison: 09/11/2010.  Findings: Emphysema.  Cardiopericardial silhouette appears within normal limits.  There is no airspace disease or effusion. Cholecystectomy clips are present in the right upper quadrant. Monitoring leads are projected over the chest.  IMPRESSION: The semen without acute cardiopulmonary disease.  Original Report Authenticated By: Andreas Newport, M.D.      ASSESSMENT:  1.  PAF rate controlled on beta blockers with heart rate in th 90 to 102bpm range 2.  Anemia with initial hgb 7.6 but now 16.1 after only 2 units PRBCs.  Apparently Hbg normal in Nov 2012 - outpt endo scheduled for next week by GI  PLAN:   Will watch patient's rhythm.  If heart  rate starts to trend up will need to increase beta blocker and watch for further pauses. Given ? Spurious reading on hbg would like input from Gi on whether safe to proceed now with starting Risa Grill, MD  05/26/2011  12:27 PM

## 2011-05-26 NOTE — Progress Notes (Signed)
[facility]   Subjective: Today she denies any issues regarding chest pain or shortness of breath. She states when she does have rapid heart rate she is aware of the tachypalpitations. She also endorses intermittent issues with dizziness while standing. Had an extensive discussion with the patient and her daughter. Both indoor that patient has been having issues with weight loss of 30 pounds over the past 2 years. This issue has been further exacerbated by the recent death of the patient's husband 7 months ago. She has had extensive issues with depression and worsening anorexia since that time. She's had a colonoscopy 7 years ago and does not have any  personal or family history of gastrointestinal problems or malignancy. She recently had a complete gynecological exam in the past month which demonstrated no abnormalities. During our discussion with the patient she became tearful when discussing her deceased husband. She endorsed they were married for 58 years and since the death of her husband she has been unable to sleep at night and has not had much success with the prescribed sleep aids from her primary care physician.  HR is rapid today. She is not feeling palpitations at rest. She is anxious to go home. Will not let lab draw blood due to the suspected abnormal hemoglobin on admission.   Objective: Vital signs in last 24 hours: Temp:  [97.1 F (36.2 C)-97.9 F (36.6 C)] 97.1 F (36.2 C) (02/02 0812) Pulse Rate:  [70-102] 102  (02/02 0812) Resp:  [12-20] 13  (02/02 0812) BP: (91-176)/(71-96) 100/79 mmHg (02/02 0812) SpO2:  [97 %-98 %] 97 % (02/02 0909) Weight change:  Last BM Date:  (pt stated that she defecates 3 x a week)  Intake/Output from previous day: 02/01 0701 - 02/02 0700 In: 6 [I.V.:6] Out: 1400 [Urine:1400] Intake/Output this shift: Total I/O In: 240 [P.O.:240] Out: -   General appearance: alert, cooperative, appears stated age and no distress Resp: clear to  auscultation bilaterally Cardio: irregularly irregular rhythm and S1, S2 normal, atrial fibrillation with ventricular rates between 100-120s at rest GI: soft, non-tender; bowel sounds normal; no masses,  no organomegaly Extremities: extremities normal, atraumatic, no cyanosis or edema Neurologic: Grossly normal Psych: Patient is clearly depressed based on clinical exam and her verbalized expression of depression; flat affect but is oriented; became tearful when discussing her deceased husband  Lab Results:  Basename 05/25/11 0901 05/24/11 1621  WBC 8.2 14.5*  HGB 16.1* 7.6*  HCT 45.2 21.2*  PLT PLATELET CLUMPS NOTED ON SMEAR, COUNT APPEARS ADEQUATE 261   BMET  Basename 05/25/11 0901 05/24/11 1621  NA 142 141  K 3.8 3.9  CL 105 104  CO2 23 24  GLUCOSE 138* 95  BUN 16 20  CREATININE 0.70 0.65  CALCIUM 9.9 10.5    Studies/Results: Dg Chest 2 View  05/24/2011  *RADIOLOGY REPORT*  Clinical Data: Irregular heart rate.  Weakness.  CHEST - 2 VIEW  Comparison: 09/11/2010.  Findings: Emphysema.  Cardiopericardial silhouette appears within normal limits.  There is no airspace disease or effusion. Cholecystectomy clips are present in the right upper quadrant. Monitoring leads are projected over the chest.  IMPRESSION: The semen without acute cardiopulmonary disease.  Original Report Authenticated By: Andreas Newport, M.D.    Medications:  I have reviewed the patient's current medications. Scheduled:    . aspirin EC  81 mg Oral Daily  . digoxin  0.0625 mg Oral QAC breakfast  . docusate sodium  100 mg Oral BID  . dronabinol  2.5 mg Oral QHS  . insulin aspart  0-5 Units Subcutaneous QHS  . insulin aspart  0-9 Units Subcutaneous TID WC  . losartan  50 mg Oral BID  . metoprolol  50 mg Oral BID  . pantoprazole (PROTONIX) IV  40 mg Intravenous Q12H  . pneumococcal 23 valent vaccine  0.5 mL Intramuscular Tomorrow-1000  . polyethylene glycol  17 g Oral Daily  . rosuvastatin  10 mg Oral  q1800  . sodium chloride  3 mL Intravenous Q12H  . tolterodine  4 mg Oral Daily  . DISCONTD: polyethylene glycol  17 g Oral Daily    Assessment/Plan:  Principal Problem:  *Atrial fibrillation with RVR/ Bradycardia Unclear what precipitated episodic RVR. Patient was in pulmonary rehabilitation on the bicycle for her second visit and may be the exertion of this activity precipitated the event. Initially it was surmised that she was anemic and this precipitated the tachycardia but it appears as if the initial hemoglobin of 7.6 was a spurious result. Nonetheless, since admission she has been weaned off the Cardizem drip and remains on her usual digoxin and metoprolol.  -Her Dig was decreased to half due to bradycardia and pauses. She now is in A-fib w/ RVR again. Will allow cardiology to adjust medications. She may end up being a candidate for a pacemaker.   Active Problems:  Anemia Initially she presented with a hemoglobin of 7.6 and given the fact she had tachycardia was felt she had symptomatic anemia and therefore was given 2 units of packed red blood cells. Her last documented hemoglobin available to her hospital records was 13 in 2011.  -after transfusion her hemoglobin has increased dramatically to 16.1 which raises question whether the initial result was accurate or not. Gastroenterology had been called in for consultation. They noticed similar trending in hemoglobin. They actually contacted the patient's primary care physician and stated the patient had hemoglobin of 13 with a normal MCV in November 2012. In addition her anemia indices do not support a chronic progressive anemia and she has not had any visualized red blood or dark stools making it less likely she has a GI bleed.  Her stool occult is negative.  -Gastroenterology is planning outpatient endoscopy next Thursday afternoon and patient will follow up with them in the office to confirm this appointment and repeat a CBC. They're asking  if cardiology will clarify if it is okay to hold the patient's aspirin until the colonoscopy is to completed.   DIABETES MELLITUS, TYPE II CBGs have been stable between 131 and 172 since admission. Hemoglobin A1c is normal at 6.3. Continue as needed sliding scale insulin. At home she is on Glucotrol 2.5 mg daily and metformin 500 mg twice a day.    ANXIETY/ Reactive depression This has been a significant problem since her husband passed away 7 months ago. Medication therapy has been offered by her primary care physician but she has refused. Apparently she has also refused psychotherapy through a formal counselor but is participating in hospice grief counseling. We did discuss with her the benefits of short-term antidepressant medications and improving the chemical changes of severe depression in the brain with plans to eventually remove these medications and titrate him off. At this time she is declining antidepressant medication. She also endorses significant issues with insomnia since her husband died. The appetite stimulant we have ordered should cause some drowsiness to help her sleep at night.   HYPERTENSION Blood pressure is well-controlled currently on metoprolol and Cozaar.  Anorexia/Weight loss, non-intentional Both patient and daughter state that this was an issue prior to the husband's death so it is less likely this is psychogenic in nature. Suspect underlying chronic constipation could be contributing. Will check a prealbumin level. Primary care has been following this and she has also seen a nutritionist in the past. A prealbumin low enough to consider home health nutritional followup. States is utilizing Glucerna shakes at home but daughter endorses the patient still just does not eat anything. We will also start Marinol at hour of sleep very low dose to see if this helps.   COPD (chronic obstructive pulmonary disease) Not currently an active problem no wheezing or shortness of breath.  Anticipate she will return to outpatient pulmonary rehabilitation when okay with cardiology.   Constipation, chronic We'll start MiraLAX once a day as well as Colace twice a day. Patient endorses has one bowel movement every 3 days which has been normal for her for quite some time. Likely poor appetite and low volume of ingested foods contributing to decreased transit time.  Disposition Will remain in the step down unit because of the issues with bradycardia and recent atrial fibrillation with RVR.    Calvert Cantor, MD [phone]   LOS: 2 days  05/26/2011, 9:18 AM

## 2011-05-27 LAB — GLUCOSE, CAPILLARY: Glucose-Capillary: 151 mg/dL — ABNORMAL HIGH (ref 70–99)

## 2011-05-27 MED ORDER — POLYETHYLENE GLYCOL 3350 17 G PO PACK: 17.0000 g | PACK | Freq: Every day | ORAL | Status: AC

## 2011-05-27 MED ORDER — DSS 100 MG PO CAPS: 100.0000 mg | ORAL_CAPSULE | Freq: Two times a day (BID) | ORAL | Status: DC

## 2011-05-27 MED ORDER — DRONABINOL 2.5 MG PO CAPS: 5.0000 mg | ORAL_CAPSULE | Freq: Every day | ORAL | Status: DC

## 2011-05-27 NOTE — Discharge Planning (Signed)
DISCHARGE SUMMARY  MOZEL BURDETT  MR#: [id number]  DOB:[birth year]  Date of Admission: 05/24/2011 Date of Discharge: 05/27/2011  Attending Physician:Fabian Walder  Patient's YQM:VHQIO, Lenon Curt, MD, MD  Consults:Treatment Team:  Vida Rigger, MD Rollene Rotunda, MD   Presenting Complaint: palpitations  Discharge Diagnoses: Principal Problems:  *Atrial fibrillation with RVR- converted to NSR  Bradycardia- Digoxin held  Active Problems:  DIABETES MELLITUS, TYPE II  ANXIETY  HYPERTENSION  COPD (chronic obstructive pulmonary disease)  Reactive depression  Anorexia  Weight loss, non-intentional  Anemia  Constipation, chronic    Discharge Medications: Medication List  As of 05/27/2011 11:26 AM   STOP taking these medications         digoxin 0.125 MG tablet         TAKE these medications         albuterol 108 (90 BASE) MCG/ACT inhaler   Commonly known as: PROVENTIL HFA;VENTOLIN HFA   Inhale 2 puffs into the lungs every 6 (six) hours as needed. As needed for shortness of breath.      aspirin 81 MG tablet   Take 81 mg by mouth every evening.      CALCIUM 600 PO   Take by mouth daily.      dronabinol 2.5 MG capsule   Commonly known as: MARINOL   Take 2 capsules (5 mg total) by mouth at bedtime.      DSS 100 MG Caps   Take 100 mg by mouth 2 (two) times daily.      Fish Oil 1200 MG Caps   Take by mouth.      Flax Seed Oil 1000 MG Caps   Take 1,000 mg by mouth 3 (three) times daily. Takes 3 daily      GLUCOTROL 5 MG tablet   Generic drug: glipiZIDE   Take 2.5 mg by mouth daily.      losartan 50 MG tablet   Commonly known as: COZAAR   Take 50 mg by mouth 2 (two) times daily.      metFORMIN 500 MG tablet   Commonly known as: GLUCOPHAGE   Take 500 mg by mouth 2 (two) times daily with a meal.      METOPROLOL TARTRATE PO   Take 50 mg by mouth 2 (two) times daily.      MULTIVITAMIN PO   Take 1 tablet by mouth daily after breakfast.      polyethylene glycol  packet   Commonly known as: MIRALAX / GLYCOLAX   Take 17 g by mouth daily.      RA GLUCOSAMINE-CHONDROITIN 750-600 MG Tabs   Generic drug: Glucosamine-Chondroitin   Take by mouth daily.      simvastatin 40 MG tablet   Commonly known as: ZOCOR   Take 40 mg by mouth every evening. Take with dinner.      tolterodine 4 MG 24 hr capsule   Commonly known as: DETROL LA   Take 4 mg by mouth daily.      vitamin C 500 MG tablet   Commonly known as: ASCORBIC ACID   Take 500 mg by mouth daily.      VITAMIN D PO   Take 2,000 mg by mouth daily.             Procedures: Dg Chest 2 View  05/24/2011  *RADIOLOGY REPORT*  Clinical Data: Irregular heart rate.  Weakness.  CHEST - 2 VIEW  Comparison: 09/11/2010.  Findings: Emphysema.  Cardiopericardial silhouette appears within normal limits.  There  is no airspace disease or effusion. Cholecystectomy clips are present in the right upper quadrant. Monitoring leads are projected over the chest.  IMPRESSION: The semen without acute cardiopulmonary disease.  Original Report Authenticated By: Andreas Newport, M.D.    Echocardiogram 05/25/11 Left ventricle: The cavity size was normal. Wall thickness was increased in a pattern of mild LVH. The estimated ejection fraction was 55%. - Mitral valve: Mild regurgitation. - Atrial septum: No defect or patent foramen ovale was identified  Hospital Course: Principal Problem:  *Atrial fibrillation with RVR /  Bradycardia  The patient presented with a complaint of palpitations. She was found to be in A-fib with RVR and therefore admitted and placed on a Cardizem drip. She was noted to become bradycardic and had numerous pauses and therefore Cardizem was discontinued and Digoxin was cut in half. She subsequently converted to NSR in the 50s. Digxon has been discontinued by [facility]. Xarelto has been recommeded by them but is currently not being given due to reasons mentioned below.   Active Problems:   Anemia-  The patient was noted to have a hemoglobin of 7.6 on admission. Due to the rapid atrial fibrillation, she was transfused 2 units of blood. Interestingly her repeat hemoglobin was 16.1. A GI consult was requested. Dr Ewing Schlein called her PCP and discovered that her hemoglobin was 13 this past fall. It is uncertain at this time whether the low hemoglobin was an inaccurate result or whether the repeat hemoglobin (16) was inaccurate. The patient has not allowed [place] to repeat the CBC. Dr Ewing Schlein has made her an appointment for this coming Thursday. Per his last note, he will repeat a hemoglobin when he see's her.  For now we will not start Xarelto but allow GI and Cardiology to decide on this after follow up in regards to the anemia. Of note one set of stool occult done here was negative.     DIABETES MELLITUS, TYPE II  ANXIETY  HYPERTENSION  COPD (chronic obstructive pulmonary disease)  Reactive depression  Anorexia/ Weight loss, non-intentional- Started on Marinol to aid in appetite. She has lost about 30 lbs in the past 2 yrs. This was recently exacerbated by her husband's death.  Constipation, chronic- started on stool softeners and Miralax.     Day of Discharge Physical Exam: BP 141/61  Pulse 55  Temp(Src) 97.6 F (36.4 C) (Oral)  Resp 11  Ht 5\' 5"  (1.651 m)  Wt 59.5 kg (131 lb 2.8 oz)  BMI 21.83 kg/m2  SpO2 96% Resp: clear to auscultation bilaterally  Cardio: irregularly irregular rhythm and S1, S2 normal, atrial fibrillation with ventricular rates between 100-120s at rest  GI: soft, non-tender; bowel sounds normal; no masses, no organomegaly  Extremities: extremities normal, atraumatic, no cyanosis or edema  Neurologic: Grossly normal  Psych: Patient is clearly depressed based on clinical exam and her verbalized expression of depression; flat affect but is oriented; became tearful when discussing her deceased husband   Results for orders placed during the hospital encounter of  05/24/11 (from the past 24 hour(s))  GLUCOSE, CAPILLARY     Status: Abnormal   Collection Time   05/26/11  1:02 PM      Component Value Range   Glucose-Capillary 173 (*) 70 - 99 (mg/dL)  GLUCOSE, CAPILLARY     Status: Abnormal   Collection Time   05/26/11  5:06 PM      Component Value Range   Glucose-Capillary 168 (*) 70 - 99 (mg/dL)  GLUCOSE, CAPILLARY  Status: Abnormal   Collection Time   05/26/11  9:19 PM      Component Value Range   Glucose-Capillary 186 (*) 70 - 99 (mg/dL)  GLUCOSE, CAPILLARY     Status: Abnormal   Collection Time   05/27/11  8:39 AM      Component Value Range   Glucose-Capillary 151 (*) 70 - 99 (mg/dL)   Comment 1 Documented in Chart     Comment 2 Notify RN      Disposition: stable for discharge.   Follow-up Appts: Future Orders Please Complete By  Diet - low sodium heart healthy    Increase activity slowly    Discharge instructions      Follow-up with Dr.Magod, Dr [name] and Dr Antoine Poche in 1-2 weeks.  Need to discuss initiation of Xarelto once it is determined that she does not have a GI bleed.    Tests Needing Follow-up: Hemoglobin/ Hematocrit will need to be ordered  Time on Discharge: >45 min  Signed: Sears Oran 05/27/2011, 11:26 AM

## 2011-05-27 NOTE — Progress Notes (Signed)
SUBJECTIVE: Converted to sinus bradycardia.  Wants to go home  OBJECTIVE:   Vitals:   Filed Vitals:   05/27/11 0600 05/27/11 0645 05/27/11 0800 05/27/11 0948  BP: 105/53  114/60 141/61  Pulse: 53 64 54 55  Temp:   97.6 F (36.4 C)   TempSrc:   Oral   Resp: 13  11   Height:      Weight:      SpO2: 96%  96%    I&O's:   Intake/Output Summary (Last 24 hours) at 05/27/11 1049 Last data filed at 05/27/11 0943  Gross per 24 hour  Intake   1040 ml  Output   1150 ml  Net   -110 ml   TELEMETRY: Reviewed telemetry pt in Sinus bradycardia     PHYSICAL EXAM General: Well developed, well nourished, in no acute distress Head: Eyes PERRLA, No xanthomas.   Normal cephalic and atramatic  Lungs:   Clear bilaterally to auscultation and percussion. Heart:   HRRR S1 S2 Pulses are 2+ & equal.            No carotid bruit. No JVD.  No abdominal bruits. No femoral bruits. Abdomen: Bowel sounds are positive, abdomen soft and non-tender without masses or                  Hernia's noted. Msk:  Back normal, normal gait. Normal strength and tone for age. Extremities:   No clubbing, cyanosis or edema.  DP +1 Neuro: Alert and oriented X 3. Psych:  Good affect, responds appropriately   LABS: Basic Metabolic Panel:  Basename 05/25/11 0901 05/24/11 1621  NA 142 141  K 3.8 3.9  CL 105 104  CO2 23 24  GLUCOSE 138* 95  BUN 16 20  CREATININE 0.70 0.65  CALCIUM 9.9 10.5  MG -- --  PHOS -- --   Liver Function Tests:  Basename 05/24/11 1621  AST 23  ALT 18  ALKPHOS 65  BILITOT 0.2*  PROT 7.3  ALBUMIN 3.8   No results found for this basename: LIPASE:2,AMYLASE:2 in the last 72 hours CBC:  Basename 05/25/11 0901 05/24/11 1621  WBC 8.2 14.5*  NEUTROABS -- --  HGB 16.1* 7.6*  HCT 45.2 21.2*  MCV 84.5 84.8  PLT PLATELET CLUMPS NOTED ON SMEAR, COUNT APPEARS ADEQUATE 261   Cardiac Enzymes:  Basename 05/24/11 1624  CKTOTAL --  CKMB --  CKMBINDEX --  TROPONINI <0.30   BNP: No  components found with this basename: POCBNP:3 D-Dimer: No results found for this basename: DDIMER:2 in the last 72 hours Hemoglobin A1C:  Basename 05/24/11 2045  HGBA1C 6.3*   Fasting Lipid Panel: No results found for this basename: CHOL,HDL,LDLCALC,TRIG,CHOLHDL,LDLDIRECT in the last 72 hours Thyroid Function Tests:  Basename 05/25/11 0901  TSH 4.132  T4TOTAL --  T3FREE --  THYROIDAB --   Anemia Panel:  Basename 05/24/11 2045  VITAMINB12 660  FOLATE >20.0  FERRITIN 112  TIBC 351  IRON 83  RETICCTPCT 1.0   Coag Panel:   Lab Results  Component Value Date   INR 0.91 05/24/2011   INR 1.02 04/27/2009    RADIOLOGY: Dg Chest 2 View  05/24/2011  *RADIOLOGY REPORT*  Clinical Data: Irregular heart rate.  Weakness.  CHEST - 2 VIEW  Comparison: 09/11/2010.  Findings: Emphysema.  Cardiopericardial silhouette appears within normal limits.  There is no airspace disease or effusion. Cholecystectomy clips are present in the right upper quadrant. Monitoring leads are projected over the chest.  IMPRESSION:  The semen without acute cardiopulmonary disease.  Original Report Authenticated By: Andreas Newport, M.D.      ASSESSMENT:  1. PAF now converted to sinus bradycardia on beta blockers/dig 2. Anemia with initial hgb 7.6 but now 16.1 after only 2 units PRBCs. Apparently Hbg normal in Nov 2012 - outpt endo scheduled for next week by GI - ? Spurious results   PLAN:   1.  Stop digoxin due to bradycardia 2.  Continue metoprolol 3.  OK to transfer to tele bed 4.  Need to discuss with GI if ok to start Xarelto.  Would like to start before d/c home if ok with GI. Since we are unsure how long she was in afib prior to presentation and a CHADS score of 2 she really should be anticoagulated .  If they are ok with starting anticoagulation then start Risa Grill, MD  05/27/2011  10:49 AM

## 2011-05-29 ENCOUNTER — Encounter (HOSPITAL_COMMUNITY): Payer: Medicare Other

## 2011-05-29 NOTE — Discharge Summary (Signed)
DISCHARGE SUMMARY  Erin Vang  MR#: [id number]  DOB:[birth year]  Date of Admission: 05/24/2011 Date of Discharge: 05/29/2011  Attending Physician:Shelisha Gautier  Patient's Erin Vang, Erin Curt, MD, MD  Consults:Treatment Team:  Vida Rigger, MD Rollene Rotunda, MD   Presenting Complaint: palpitations  Discharge Diagnoses: Principal Problems:  *Atrial fibrillation with RVR- converted to NSR  Bradycardia- Digoxin held  Active Problems:  DIABETES MELLITUS, TYPE II  ANXIETY  HYPERTENSION  COPD (chronic obstructive pulmonary disease)  Reactive depression  Anorexia  Weight loss, non-intentional  Anemia  Constipation, chronic    Discharge Medications: Medication List  As of 05/29/2011  2:11 PM   STOP taking these medications         digoxin 0.125 MG tablet         TAKE these medications         albuterol 108 (90 BASE) MCG/ACT inhaler   Commonly known as: PROVENTIL HFA;VENTOLIN HFA   Inhale 2 puffs into the lungs every 6 (six) hours as needed. As needed for shortness of breath.      aspirin 81 MG tablet   Take 81 mg by mouth every evening.      CALCIUM 600 PO   Take by mouth daily.      dronabinol 2.5 MG capsule   Commonly known as: MARINOL   Take 2 capsules (5 mg total) by mouth at bedtime.      DSS 100 MG Caps   Take 100 mg by mouth 2 (two) times daily.      Fish Oil 1200 MG Caps   Take by mouth.      Flax Seed Oil 1000 MG Caps   Take 1,000 mg by mouth 3 (three) times daily. Takes 3 daily      GLUCOTROL 5 MG tablet   Generic drug: glipiZIDE   Take 2.5 mg by mouth daily.      losartan 50 MG tablet   Commonly known as: COZAAR   Take 50 mg by mouth 2 (two) times daily.      metFORMIN 500 MG tablet   Commonly known as: GLUCOPHAGE   Take 500 mg by mouth 2 (two) times daily with a meal.      METOPROLOL TARTRATE PO   Take 50 mg by mouth 2 (two) times daily.      MULTIVITAMIN PO   Take 1 tablet by mouth daily after breakfast.      polyethylene glycol  packet   Commonly known as: MIRALAX / GLYCOLAX   Take 17 g by mouth daily.      RA GLUCOSAMINE-CHONDROITIN 750-600 MG Tabs   Generic drug: Glucosamine-Chondroitin   Take by mouth daily.      simvastatin 40 MG tablet   Commonly known as: ZOCOR   Take 40 mg by mouth every evening. Take with dinner.      tolterodine 4 MG 24 hr capsule   Commonly known as: DETROL LA   Take 4 mg by mouth daily.      vitamin C 500 MG tablet   Commonly known as: ASCORBIC ACID   Take 500 mg by mouth daily.      VITAMIN D PO   Take 2,000 mg by mouth daily.             Procedures: Dg Chest 2 View  05/24/2011  *RADIOLOGY REPORT*  Clinical Data: Irregular heart rate.  Weakness.  CHEST - 2 VIEW  Comparison: 09/11/2010.  Findings: Emphysema.  Cardiopericardial silhouette appears within normal limits.  There is no airspace disease or effusion. Cholecystectomy clips are present in the right upper quadrant. Monitoring leads are projected over the chest.  IMPRESSION: The semen without acute cardiopulmonary disease.  Original Report Authenticated By: Andreas Newport, M.D.    Echocardiogram 05/25/11 Left ventricle: The cavity size was normal. Wall thickness was increased in a pattern of mild LVH. The estimated ejection fraction was 55%. - Mitral valve: Mild regurgitation. - Atrial septum: No defect or patent foramen ovale was identified  Hospital Course: Principal Problem:  *Atrial fibrillation with RVR /  Bradycardia  The patient presented with a complaint of palpitations. She was found to be in A-fib with RVR and therefore admitted and placed on a Cardizem drip. She was noted to become bradycardic and had numerous pauses and therefore Cardizem was discontinued and Digoxin was cut in half. She subsequently converted to NSR in the 50s. Digxon has been discontinued by [facility]. Xarelto has been recommeded by them but is currently not being given due to reasons mentioned below.   Active Problems:   Anemia-  The patient was noted to have a hemoglobin of 7.6 on admission. Due to the rapid atrial fibrillation, she was transfused 2 units of blood. Interestingly her repeat hemoglobin was 16.1. A GI consult was requested. Dr Ewing Schlein called her PCP and discovered that her hemoglobin was 13 this past fall. It is uncertain at this time whether the low hemoglobin was an inaccurate result or whether the repeat hemoglobin (16) was inaccurate. The patient has not allowed [place] to repeat the CBC. Dr Ewing Schlein has made her an appointment for this coming Thursday. Per his last note, he will repeat a hemoglobin when he see's her.  For now we will not start Xarelto but allow GI and Cardiology to decide on this after follow up in regards to the anemia. Of note one set of stool occult done here was negative.     DIABETES MELLITUS, TYPE II  ANXIETY  HYPERTENSION  COPD (chronic obstructive pulmonary disease)  Reactive depression  Anorexia/ Weight loss, non-intentional- Started on Marinol to aid in appetite. She has lost about 30 lbs in the past 2 yrs. This was recently exacerbated by her husband's death.  Constipation, chronic- started on stool softeners and Miralax.     Day of Discharge Physical Exam: BP 141/61  Pulse 55  Temp(Src) 97.6 F (36.4 C) (Oral)  Resp 11  Ht 5\' 5"  (1.651 m)  Wt 59.5 kg (131 lb 2.8 oz)  BMI 21.83 kg/m2  SpO2 96% Resp: clear to auscultation bilaterally  Cardio: irregularly irregular rhythm and S1, S2 normal, atrial fibrillation with ventricular rates between 100-120s at rest  GI: soft, non-tender; bowel sounds normal; no masses, no organomegaly  Extremities: extremities normal, atraumatic, no cyanosis or edema  Neurologic: Grossly normal  Psych: Patient is clearly depressed based on clinical exam and her verbalized expression of depression; flat affect but is oriented; became tearful when discussing her deceased husband   No results found for this or any previous visit (from the  past 24 hour(s)).  Disposition: stable for discharge.   Follow-up Appts: Future Orders Please Complete By  Diet - low sodium heart healthy    Increase activity slowly    Discharge instructions      Follow-up with Dr.Magod, Dr [name] and Dr Antoine Poche in 1-2 weeks.  Need to discuss initiation of Xarelto once it is determined that she does not have a GI bleed.    Tests Needing Follow-up: Hemoglobin/ Hematocrit  will need to be ordered  Time on Discharge: >45 min  Signed: Calvert Cantor 05/29/2011, 2:11 PM

## 2011-05-31 ENCOUNTER — Encounter (HOSPITAL_COMMUNITY): Payer: Medicare Other

## 2011-06-04 ENCOUNTER — Ambulatory Visit (INDEPENDENT_AMBULATORY_CARE_PROVIDER_SITE_OTHER): Payer: Medicare Other | Admitting: Nurse Practitioner

## 2011-06-04 ENCOUNTER — Encounter: Payer: Self-pay | Admitting: Nurse Practitioner

## 2011-06-04 VITALS — BP 122/82 | HR 101 | Ht 65.0 in | Wt 133.8 lb

## 2011-06-04 DIAGNOSIS — I4891 Unspecified atrial fibrillation: Secondary | ICD-10-CM

## 2011-06-04 DIAGNOSIS — F329 Major depressive disorder, single episode, unspecified: Secondary | ICD-10-CM

## 2011-06-04 DIAGNOSIS — F341 Dysthymic disorder: Secondary | ICD-10-CM

## 2011-06-04 MED ORDER — RIVAROXABAN 20 MG PO TABS: 20.0000 mg | ORAL_TABLET | Freq: Every day | ORAL | Status: DC

## 2011-06-04 MED ORDER — DIGOXIN 125 MCG PO TABS: 125.0000 ug | ORAL_TABLET | Freq: Every day | ORAL | Status: DC

## 2011-06-04 NOTE — Progress Notes (Signed)
Erin Vang Date of Birth: 10-Oct-1935 Medical Record #[id number]  History of Present Illness: Erin Vang is seen back today for a post hospital visit. She is seen for Dr. Patty Sermons. She was just hospitalized at the first of the month with atrial fib with RVR. She did convert with IV Cardizem and lanoxin but became bradycardic. When she first arrived, her blood count was checked and was reported as 7.6. She was transfused 2 units and a recheck showed her hemoglobin over 16. There is concern that this was just lab error. One stool there was heme negative. Her CHADs is a 2. She was sent home, in sinus, off of Lanoxin and not on anticoagulation except for aspirin. She had repeat lab last week with Dr. [name] and reports that her blood count was still 16. He was wondering if she should be back on her Digoxin.   She comes in today. She is not really aware of her atrial fib. She feels tired. She remains terribly depressed. She is very tearful. She continues to grieve for her husband. He died 7 months ago. They were married for almost 58 years. She has refused antidepressant therapy. She has lost a significant amount of weight. She is on Marinol to help her eat. She will feel a little dizzy if she gets up too quick. No chest pain.  No reports of any bleeding. Her stools are normal. She only has a bowel movement 2 to 3 times a week. She is currently doing stool cards for Dr. Ewing Schlein. She did have an echo during that admission showing a normal EF with mild LVH.   Current Outpatient Prescriptions on File Prior to Visit  Medication Sig Dispense Refill  . albuterol (PROVENTIL HFA;VENTOLIN HFA) 108 (90 BASE) MCG/ACT inhaler Inhale 2 puffs into the lungs every 6 (six) hours as needed. As needed for shortness of breath.      Marland Kitchen aspirin 81 MG tablet Take 81 mg by mouth every evening.       . Calcium Carbonate (CALCIUM 600 PO) Take by mouth daily.       . Cholecalciferol (VITAMIN D PO) Take 2,000 mg by mouth daily.         . Flaxseed, Linseed, (FLAX SEED OIL) 1000 MG CAPS Take 1,000 mg by mouth 3 (three) times daily. Takes 3 daily      . glipiZIDE (GLUCOTROL) 5 MG tablet Take 5 mg by mouth daily.       . Glucosamine-Chondroitin (RA GLUCOSAMINE-CHONDROITIN) 750-600 MG TABS Take by mouth daily.        Marland Kitchen losartan (COZAAR) 50 MG tablet Take 50 mg by mouth daily.       . metFORMIN (GLUCOPHAGE) 500 MG tablet Take 500 mg by mouth 2 (two) times daily with a meal.        . METOPROLOL TARTRATE PO Take 50 mg by mouth 2 (two) times daily.       . Multiple Vitamin (MULTIVITAMIN PO) Take 1 tablet by mouth daily after breakfast.       . Omega-3 Fatty Acids (FISH OIL) 1200 MG CAPS Take by mouth.        . simvastatin (ZOCOR) 40 MG tablet Take 40 mg by mouth every evening. Take with dinner.      . tolterodine (DETROL LA) 4 MG 24 hr capsule Take 4 mg by mouth daily.      . vitamin C (ASCORBIC ACID) 500 MG tablet Take 500 mg by mouth daily.        Marland Kitchen  DISCONTD: docusate sodium 100 MG CAPS Take 100 mg by mouth 2 (two) times daily.  10 capsule  0  . digoxin (LANOXIN) 0.125 MG tablet Take 1 tablet (125 mcg total) by mouth daily.  30 tablet  11  . Rivaroxaban (XARELTO) 20 MG TABS Take 20 mg by mouth daily.  30 tablet  6    Allergies  Allergen Reactions  . Erythromycin Hives  . Macrobid Other (See Comments)    Unknown   . Nitrofurantoin Other (See Comments)    Unknown   . Wool Alcohol (Lanolin Alcohol) Hives  . Penicillins Hives and Rash  . Sulfonamide Derivatives Hives and Rash  . Triple Antibiotic Rash    Past Medical History  Diagnosis Date  . Hypertension   . Diabetes mellitus   . Osteoarthritis   . Hypercholesterolemia   . Paroxysmal atrial fibrillation     CHADS2: 2  . Syncope and collapse     When patient stands up real quickly  . Normal cardiac stress test     a.  lexiscan MV - EF 72%, No ischemia  . Anemia     ? of lab error vs GI bleed  . COPD (chronic obstructive pulmonary disease)   . Weight loss   .  Depression   . Prolonged grief reaction     Past Surgical History  Procedure Date  . Gallbladder surgery   . Appendectomy     age 70  . Cervical spine surgery jan 2011    History  Smoking status  . Former Smoker -- 2.0 packs/day for 35 years  . Types: Cigarettes  . Quit date: 04/23/1986  Smokeless tobacco  . Never Used    History  Alcohol Use No    Family History  Problem Relation Age of Onset  . Heart failure Mother     Died 61  . Cancer Father     Died 39    Review of Systems: The review of systems is positive for depression.  All other systems were reviewed and are negative.  Physical Exam: BP 122/82  Pulse 101  Ht 5\' 5"  (1.651 m)  Wt 133 lb 12.8 oz (60.691 kg)  BMI 22.27 kg/m2  SpO2 98% Patient is pleasant and in no acute distress. She is quite depressed and tearful at times. Skin is warm and dry. Color is normal.  HEENT is unremarkable. Normocephalic/atraumatic. PERRL. Sclera are nonicteric. Neck is supple. No masses. No JVD. Lungs are clear. Cardiac exam shows an irregular rate and rhythm. She is a little fast. Abdomen is soft. Extremities are without edema. Gait and ROM are intact. No gross neurologic deficits noted.   LABORATORY DATA: EKG today shows atrial fibrillation. Her rate is 101.    Assessment / Plan:

## 2011-06-04 NOTE — Patient Instructions (Addendum)
We are going to restart your Digoxin. Restart today.  We are going to put a monitor on you to watch your rhythm  We are going to recheck your blood count today.  We are going to start you on the Xarelto to prevent a stroke. Take this at suppertime daily.  We will see you next week.  Stop your aspirin after Thursday.  Call the [facility] office at [phone] if you have any questions, problems or concerns.

## 2011-06-04 NOTE — Assessment & Plan Note (Signed)
She remains depressed. We had a nice discussion regarding her grief. We briefly discussed anti depressant therapy. She is willing to think about it. Will discuss further on return. This may help with her weight loss as well.

## 2011-06-04 NOTE — Assessment & Plan Note (Signed)
She is back in atrial fib. Needs better rate control. Currently not on Digoxin or Cardizem. Only on aspirin therapy. Looks like the low hemoglobin was lab error. She has stool cards in progress and did have a negative stool in the hospital. No signs/reports of bleeding. We are going to do several things. We are going to restart the Digoxin at 0.125 mg daily. She has this at home. We are going to recheck her labs today. We are going to start Xarelto 20 mg daily at supper time. Samples are given today. We are also going to place an event monitor to wear for the next 30 days to watch her rhythm. She may ultimately need PTVP implant and this was briefly discussed today. She will see Dr. Patty Sermons back next week. Patient is agreeable to this plan and will call if any problems develop in the interim.

## 2011-06-05 ENCOUNTER — Encounter (HOSPITAL_COMMUNITY): Payer: Medicare Other

## 2011-06-05 ENCOUNTER — Telehealth: Payer: Self-pay | Admitting: *Deleted

## 2011-06-05 LAB — CBC WITH DIFFERENTIAL/PLATELET
Basophils Absolute: 0.1 10*3/uL (ref 0.0–0.1)
Basophils Relative: 1 % (ref 0.0–3.0)
Eosinophils Absolute: 0.2 10*3/uL (ref 0.0–0.7)
Eosinophils Relative: 2.1 % (ref 0.0–5.0)
HCT: 46.4 % — ABNORMAL HIGH (ref 36.0–46.0)
Hemoglobin: 15.5 g/dL — ABNORMAL HIGH (ref 12.0–15.0)
Lymphocytes Relative: 20.5 % (ref 12.0–46.0)
Lymphs Abs: 2 10*3/uL (ref 0.7–4.0)
MCHC: 33.5 g/dL (ref 30.0–36.0)
MCV: 90.7 fl (ref 78.0–100.0)
Monocytes Absolute: 1.1 10*3/uL — ABNORMAL HIGH (ref 0.1–1.0)
Monocytes Relative: 11.3 % (ref 3.0–12.0)
Neutro Abs: 6.3 10*3/uL (ref 1.4–7.7)
Neutrophils Relative %: 65.1 % (ref 43.0–77.0)
Platelets: 234 10*3/uL (ref 150.0–400.0)
RBC: 5.11 Mil/uL (ref 3.87–5.11)
RDW: 13.4 % (ref 11.5–14.6)
WBC: 9.7 10*3/uL (ref 4.5–10.5)

## 2011-06-05 LAB — BASIC METABOLIC PANEL
BUN: 25 mg/dL — ABNORMAL HIGH (ref 6–23)
CO2: 27 mEq/L (ref 19–32)
Calcium: 9.6 mg/dL (ref 8.4–10.5)
Chloride: 104 mEq/L (ref 96–112)
Creatinine, Ser: 0.9 mg/dL (ref 0.4–1.2)
GFR: 66.78 mL/min (ref >60.00)
Glucose, Bld: 181 mg/dL — ABNORMAL HIGH (ref 70–99)
Potassium: 4.5 mEq/L (ref 3.5–5.1)
Sodium: 139 mEq/L (ref 135–145)

## 2011-06-05 NOTE — Telephone Encounter (Signed)
Message copied by Burnell Blanks on Tue Jun 05, 2011  1:31 PM ------      Message from: Rosalio Macadamia      Created: Tue Jun 05, 2011 12:39 PM       Ok to report. Labs are satisfactory. I did start her on Xarelto. I do not think she is bleeding. Stool cards are in process with Dr. Ewing Schlein. Needs to increase her water. Please share with Dr. Patty Sermons.

## 2011-06-05 NOTE — Telephone Encounter (Signed)
Advised of labs 

## 2011-06-05 NOTE — Telephone Encounter (Signed)
Message copied by Burnell Blanks on Tue Jun 05, 2011  2:51 PM ------      Message from: Cassell Clement      Created: Tue Jun 05, 2011  2:07 PM       Agree with plan.

## 2011-06-07 ENCOUNTER — Encounter (HOSPITAL_COMMUNITY): Payer: Medicare Other

## 2011-06-11 ENCOUNTER — Encounter: Payer: Self-pay | Admitting: Cardiology

## 2011-06-11 ENCOUNTER — Ambulatory Visit (INDEPENDENT_AMBULATORY_CARE_PROVIDER_SITE_OTHER): Payer: Medicare Other | Admitting: Cardiology

## 2011-06-11 VITALS — BP 139/71 | HR 65 | Ht 65.0 in | Wt 132.0 lb

## 2011-06-11 DIAGNOSIS — F329 Major depressive disorder, single episode, unspecified: Secondary | ICD-10-CM

## 2011-06-11 DIAGNOSIS — F341 Dysthymic disorder: Secondary | ICD-10-CM

## 2011-06-11 DIAGNOSIS — E7800 Pure hypercholesterolemia, unspecified: Secondary | ICD-10-CM

## 2011-06-11 DIAGNOSIS — I4891 Unspecified atrial fibrillation: Secondary | ICD-10-CM

## 2011-06-11 NOTE — Assessment & Plan Note (Signed)
The patient has a history of hypercholesterolemia.  She is on simvastatin 40 mg daily.  She's not having any side effects from the simvastatin at this point.

## 2011-06-11 NOTE — Assessment & Plan Note (Signed)
The patient has been less aware of her heart beating fast recently.  Her electrocardiogram today confirms that she is in normal sinus rhythm with occasional premature atrial beat.

## 2011-06-11 NOTE — Patient Instructions (Signed)
Your physician recommends that you continue on your current medications as directed. Please refer to the Current Medication list given to you today. Your physician recommends that you schedule a follow-up appointment in: 1 month office visit and EKG with  Dr. Patty Sermons or Lawson Fiscal NP

## 2011-06-11 NOTE — Assessment & Plan Note (Signed)
The patient is still having a lot of symptoms of reactive depression following her husband's death.  She agrees that she is having symptoms of depression and has agreed to ask her primary care provider to give her a mild antidepressant.

## 2011-06-11 NOTE — Progress Notes (Signed)
Erin Vang Date of Birth:  1935/06/19 [facility] [address] [place], [place]  [address] [phone]         Fax   [phone]  History of Present Illness: This pleasant 76 year old woman is seen for one week followup office visit.  The patient is a recent widow.  She is still in the grieving process following her husband's death.  About 2 weeks ago she was in the pulmonary rehabilitation program and had sudden onset of rapid irregular heart rate.  She was taken from the rehabilitation program to the Hospital emergency room and subsequently admitted.  She was seen in our office last week and a vent monitor was placed.  Event monitor has been showing atrial fibrillation with a rapid ventricular response.  She is on xarelto for anticoagulation.  She has not had any thromboembolic symptoms  Current Outpatient Prescriptions  Medication Sig Dispense Refill  . albuterol (PROVENTIL HFA;VENTOLIN HFA) 108 (90 BASE) MCG/ACT inhaler Inhale 2 puffs into the lungs every 6 (six) hours as needed. As needed for shortness of breath.      . Calcium Carbonate (CALCIUM 600 PO) Take by mouth daily.       . Cholecalciferol (VITAMIN D PO) Take 2,000 mg by mouth daily.        . digoxin (LANOXIN) 0.125 MG tablet Take 1 tablet (125 mcg total) by mouth daily.  30 tablet  11  . Flaxseed, Linseed, (FLAX SEED OIL) 1000 MG CAPS Take 1,000 mg by mouth 3 (three) times daily. Takes 3 daily      . glipiZIDE (GLUCOTROL) 5 MG tablet Take 5 mg by mouth daily.       . Glucosamine-Chondroitin (RA GLUCOSAMINE-CHONDROITIN) 750-600 MG TABS Take by mouth daily.        Marland Kitchen losartan (COZAAR) 50 MG tablet Take 50 mg by mouth daily.       . metFORMIN (GLUCOPHAGE) 500 MG tablet Take 500 mg by mouth 2 (two) times daily with a meal.        . METOPROLOL TARTRATE PO Take 50 mg by mouth 2 (two) times daily.       . Multiple Vitamin (MULTIVITAMIN PO) Take 1 tablet by mouth daily after breakfast.       . Omega-3 Fatty  Acids (FISH OIL) 1200 MG CAPS Take by mouth.        . Rivaroxaban (XARELTO) 20 MG TABS Take 20 mg by mouth daily.  30 tablet  6  . simvastatin (ZOCOR) 40 MG tablet Take 40 mg by mouth every evening. Take with dinner.      . tolterodine (DETROL LA) 4 MG 24 hr capsule Take 4 mg by mouth daily.      . vitamin C (ASCORBIC ACID) 500 MG tablet Take 500 mg by mouth daily.        Erin Vang Sodium (DSS) 100 MG CAPS Take 100 mg by mouth daily.        Allergies  Allergen Reactions  . Erythromycin Hives  . Macrobid Other (See Comments)    Unknown   . Nitrofurantoin Other (See Comments)    Unknown   . Wool Alcohol (Lanolin Alcohol) Hives  . Penicillins Hives and Rash  . Sulfonamide Derivatives Hives and Rash  . Triple Antibiotic Rash    Patient Active Problem List  Diagnoses  . DIABETES MELLITUS, TYPE II  . ANXIETY  . HYPERTENSION  . Atrial fibrillation with RVR  . ALLERGIC RHINITIS  . DYSPNEA  .  Osteoarthritis  . Hypercholesterolemia  . Benign hypertensive heart disease without heart failure  . COPD (chronic obstructive pulmonary disease)  . Reactive depression  . Anorexia  . Anemia  . Weight loss, non-intentional  . Bradycardia  . Normal cardiac stress test  . Paroxysmal atrial fibrillation  . Constipation, chronic    History  Smoking status  . Former Smoker -- 2.0 packs/day for 35 years  . Types: Cigarettes  . Quit date: 04/23/1986  Smokeless tobacco  . Never Used    History  Alcohol Use No    Family History  Problem Relation Age of Onset  . Heart failure Mother     Died 50  . Cancer Father     Died 23    Review of Systems: Constitutional: no fever chills diaphoresis or fatigue or change in weight.  Head and neck: no hearing loss, no epistaxis, no photophobia or visual disturbance. Respiratory: No cough, shortness of breath or wheezing. Cardiovascular: No chest pain peripheral edema, palpitations. Gastrointestinal: No abdominal distention, no abdominal  pain, no change in bowel habits hematochezia or melena. Genitourinary: No dysuria, no frequency, no urgency, no nocturia. Musculoskeletal:No arthralgias, no back pain, no gait disturbance or myalgias. Neurological: No dizziness, no headaches, no numbness, no seizures, no syncope, no weakness, no tremors. Hematologic: No lymphadenopathy, no easy bruising. Psychiatric: No confusion, no hallucinations, no sleep disturbance.    Physical Exam: Filed Vitals:   06/11/11 1550  BP: 139/71  Pulse: 65   the general appearance reveals a well-developed well-nourished woman in no distress.Pupils equal and reactive.   Extraocular Movements are full.  There is no scleral icterus.  The mouth and pharynx are normal.  The neck is supple.  The carotids reveal no bruits.  The jugular venous pressure is normal.  The thyroid is not enlarged.  There is no lymphadenopathy.  The chest is clear to percussion and auscultation. There are no rales or rhonchi. Expansion of the chest is symmetrical.  The precordium is quiet.  The first heart sound is normal.  The second heart sound is physiologically split.  There is no murmur gallop rub or click.  There is no abnormal lift or heave.  EKG confirms normal sinus rhythm today. The abdomen is soft and nontender. Bowel sounds are normal. The liver and spleen are not enlarged. There Are no abdominal masses. There are no bruits.  The pedal pulses are good.  There is no phlebitis or edema.  There is no cyanosis or clubbing. Strength is normal and symmetrical in all extremities.  There is no lateralizing weakness.  There are no sensory deficits.  The skin is warm and dry.  There is no rash.  EKG today shows normal sinus rhythm with occasional premature atrial beats.  No ischemic changes.     Assessment / Plan: Continue same medications.  Continue to wear the event monitor to document how often she is going in and out of atrial fibrillation and rule out excessive bradycardia.   Continue Xarelto.  Recheck in one month with myself or Lori for office visit and EKG.  She will contact Dr. [name] concerning possible mild antidepressant.

## 2011-06-12 ENCOUNTER — Encounter (HOSPITAL_COMMUNITY): Admission: RE | Admit: 2011-06-12 | Payer: Medicare Other | Source: Ambulatory Visit

## 2011-06-12 ENCOUNTER — Telehealth: Payer: Self-pay | Admitting: Cardiology

## 2011-06-12 NOTE — Telephone Encounter (Signed)
Forwarded to Dr. Brackbill 

## 2011-06-12 NOTE — Telephone Encounter (Signed)
Will send to pulmonary rehab

## 2011-06-12 NOTE — Telephone Encounter (Signed)
Pt calling to see if she can go rehab

## 2011-06-12 NOTE — Telephone Encounter (Signed)
Yes, okay for her to start back in pulmonary rehabilitation again.

## 2011-06-13 ENCOUNTER — Telehealth: Payer: Self-pay | Admitting: Cardiology

## 2011-06-13 NOTE — Telephone Encounter (Signed)
Agree with plan 

## 2011-06-13 NOTE — Telephone Encounter (Signed)
Patient returning nurse MP call, she can be reached at hm# [phone]

## 2011-06-13 NOTE — Telephone Encounter (Signed)
Patient event monitor showed 3.5 pause, called and spoke with her.  Stated at time of pause she was sleeping and has not been experiencing any dizziness.  Will continue to monitor

## 2011-06-14 ENCOUNTER — Encounter (HOSPITAL_COMMUNITY): Payer: Medicare Other

## 2011-06-19 ENCOUNTER — Encounter (HOSPITAL_COMMUNITY): Payer: Medicare Other

## 2011-06-20 ENCOUNTER — Telehealth: Payer: Self-pay | Admitting: *Deleted

## 2011-06-20 NOTE — Telephone Encounter (Signed)
Agree 

## 2011-06-20 NOTE — Telephone Encounter (Signed)
Patient monitor showing Afib 130-140.   Dr. Patty Sermons reviewed and will have her increase digoxin to 2 in the am and continue to monitor.  Advised patient

## 2011-06-21 ENCOUNTER — Encounter (HOSPITAL_COMMUNITY): Payer: Medicare Other

## 2011-06-26 ENCOUNTER — Telehealth: Payer: Self-pay | Admitting: Internal Medicine

## 2011-06-26 ENCOUNTER — Encounter (HOSPITAL_COMMUNITY): Payer: Medicare Other

## 2011-06-26 NOTE — Telephone Encounter (Signed)
Called with AF and 3.1 sec pause on monitoring. Message sent to Dr. Patty Sermons

## 2011-06-28 ENCOUNTER — Encounter (HOSPITAL_COMMUNITY): Payer: Medicare Other

## 2011-07-03 ENCOUNTER — Ambulatory Visit: Payer: Medicare Other | Admitting: Cardiology

## 2011-07-05 ENCOUNTER — Encounter (HOSPITAL_COMMUNITY): Payer: Medicare Other

## 2011-07-09 ENCOUNTER — Ambulatory Visit: Payer: Medicare Other | Admitting: Adult Health

## 2011-07-10 ENCOUNTER — Encounter (HOSPITAL_COMMUNITY): Payer: Medicare Other

## 2011-07-12 ENCOUNTER — Encounter (HOSPITAL_COMMUNITY): Payer: Medicare Other

## 2011-07-13 ENCOUNTER — Ambulatory Visit: Payer: Medicare Other | Admitting: Adult Health

## 2011-07-16 ENCOUNTER — Encounter: Payer: Self-pay | Admitting: Nurse Practitioner

## 2011-07-16 ENCOUNTER — Ambulatory Visit (INDEPENDENT_AMBULATORY_CARE_PROVIDER_SITE_OTHER): Payer: Medicare Other | Admitting: Nurse Practitioner

## 2011-07-16 VITALS — BP 118/70 | HR 72 | Ht 65.0 in | Wt 136.0 lb

## 2011-07-16 DIAGNOSIS — F329 Major depressive disorder, single episode, unspecified: Secondary | ICD-10-CM

## 2011-07-16 DIAGNOSIS — I4891 Unspecified atrial fibrillation: Secondary | ICD-10-CM

## 2011-07-16 DIAGNOSIS — F341 Dysthymic disorder: Secondary | ICD-10-CM

## 2011-07-16 DIAGNOSIS — R0602 Shortness of breath: Secondary | ICD-10-CM

## 2011-07-16 LAB — BASIC METABOLIC PANEL
BUN: 17 mg/dL (ref 6–23)
CO2: 26 mEq/L (ref 19–32)
Calcium: 9.4 mg/dL (ref 8.4–10.5)
Chloride: 105 mEq/L (ref 96–112)
Creatinine, Ser: 0.7 mg/dL (ref 0.4–1.2)
GFR: 91.45 mL/min (ref >60.00)
Glucose, Bld: 174 mg/dL — ABNORMAL HIGH (ref 70–99)
Potassium: 3.8 mEq/L (ref 3.5–5.1)
Sodium: 140 mEq/L (ref 135–145)

## 2011-07-16 MED ORDER — RIVAROXABAN 20 MG PO TABS: 20.0000 mg | ORAL_TABLET | Freq: Every day | ORAL | Status: DC

## 2011-07-16 MED ORDER — DIGOXIN 250 MCG PO TABS: 250.0000 ug | ORAL_TABLET | Freq: Every day | ORAL | Status: DC

## 2011-07-16 NOTE — Progress Notes (Signed)
Erin Vang Date of Birth: [birth year] Medical Record #[id number]  History of Present Illness: Erin Vang is seen back today for a follow up visit. She is seen for Erin Vang. She has atrial fib and is now on Xarelto. Event monitor was placed for a month. She did have some pauses but these were at night. She had inadequate rate control and her Digoxin was increased.   She comes back today. She is here alone. She is doing much better. She is now on some low dose Celexa. She is not crying. Seems more upbeat. No chest pain or shortness of breath. She will get a little dizzy if she stands up too quickly. She feels good on her medicines. No nausea or visual changes. She took her dose of Digoxin about 6 hours ago.   Current Outpatient Prescriptions on File Prior to Visit  Medication Sig Dispense Refill  . albuterol (PROVENTIL HFA;VENTOLIN HFA) 108 (90 BASE) MCG/ACT inhaler Inhale 2 puffs into the lungs every 6 (six) hours as needed. As needed for shortness of breath.      . Calcium Carbonate (CALCIUM 600 PO) Take by mouth daily.       . Cholecalciferol (VITAMIN D PO) Take 2,000 mg by mouth daily.        . Flaxseed, Linseed, (FLAX SEED OIL) 1000 MG CAPS Take 1,000 mg by mouth 3 (three) times daily. Takes 3 daily      . glipiZIDE (GLUCOTROL) 5 MG tablet Take 5 mg by mouth daily.       . Glucosamine-Chondroitin (RA GLUCOSAMINE-CHONDROITIN) 750-600 MG TABS Take by mouth daily.        Marland Kitchen losartan (COZAAR) 50 MG tablet Take 50 mg by mouth daily.       . metFORMIN (GLUCOPHAGE) 500 MG tablet Take 500 mg by mouth 2 (two) times daily with a meal.        . METOPROLOL TARTRATE PO Take 50 mg by mouth 2 (two) times daily.       . Multiple Vitamin (MULTIVITAMIN PO) Take 1 tablet by mouth daily after breakfast.       . Omega-3 Fatty Acids (FISH OIL) 1200 MG CAPS Take by mouth.        . simvastatin (ZOCOR) 40 MG tablet Take 40 mg by mouth every evening. Take with dinner.      . vitamin C (ASCORBIC ACID) 500 MG tablet  Take 500 mg by mouth daily.        Marland Kitchen DISCONTD: digoxin (LANOXIN) 0.125 MG tablet Take 250 mcg by mouth daily. 2 in the morning      . DISCONTD: Rivaroxaban (XARELTO) 20 MG TABS Take 20 mg by mouth daily.  30 tablet  6  . Docusate Sodium (DSS) 100 MG CAPS Take 100 mg by mouth daily.        Allergies  Allergen Reactions  . Erythromycin Hives  . Macrobid Other (See Comments)    Unknown   . Nitrofurantoin Other (See Comments)    Unknown   . Wool Alcohol (Lanolin Alcohol) Hives  . Penicillins Hives and Rash  . Sulfonamide Derivatives Hives and Rash  . Triple Antibiotic Rash    Past Medical History  Diagnosis Date  . Hypertension   . Diabetes mellitus   . Osteoarthritis   . Hypercholesterolemia   . Paroxysmal atrial fibrillation     CHADS2: 2; manged with rate control and anticoagulation.   . Syncope and collapse     When patient stands up real  quickly  . Normal cardiac stress test     a.  lexiscan MV - EF 72%, No ischemia  . Anemia     ? of lab error vs GI bleed  . COPD (chronic obstructive pulmonary disease)   . Weight loss   . Depression   . Prolonged grief reaction   . Chronic anticoagulation     On Xarelto    Past Surgical History  Procedure Date  . Gallbladder surgery   . Appendectomy     age 26  . Cervical spine surgery jan 2011    History  Smoking status  . Former Smoker -- 2.0 packs/day for 35 years  . Types: Cigarettes  . Quit date: 04/23/1986  Smokeless tobacco  . Never Used    History  Alcohol Use No    Family History  Problem Relation Age of Onset  . Heart failure Mother     Died 79  . Cancer Father     Died 18    Review of Systems: The review of systems is per the HPI.  She has gained 5 pounds over the last month. Still not sleeping well but her depression does appear improved. All other systems were reviewed and are negative.  Physical Exam: BP 118/70  Pulse 72  Wt 136 lb (61.689 kg) Patient is very pleasant and in no acute  distress. She looks better today. Skin is warm and dry. Color is normal.  HEENT is unremarkable. Normocephalic/atraumatic. PERRL. Sclera are nonicteric. Neck is supple. No masses. No JVD. Lungs are clear. Cardiac exam shows an irregular rhythm. Rate is controlled. Abdomen is soft. Extremities are without edema. Gait and ROM are intact. No gross neurologic deficits noted.   LABORATORY DATA: PENDING   Assessment / Plan:

## 2011-07-16 NOTE — Assessment & Plan Note (Addendum)
She is better clinically. Her event monitor did show pauses at night with increased RVR otherwise. Rate is now better controlled. She is on higher doses of Digoxin. Will check a BMET and a digoxin level today. She is tolerating her Xarelto. I have sent prescriptions to [facility] for the Digoxin and Xarelto. We will continue to manage her medically but may need EP referral if we have recurrent issues or change in symptoms. We will tentatively see her back in 2 months. Patient is agreeable to this plan and will call if any problems develop in the interim.

## 2011-07-16 NOTE — Patient Instructions (Signed)
We are going to check some labs today.  Stay on your current medicines.  We will see you back in 2 months.  Call the [facility] office at [phone] if you have any questions, problems or concerns.   

## 2011-07-16 NOTE — Assessment & Plan Note (Signed)
She is doing much better with the low dose Celexa. She will continue at least for the next few months.

## 2011-07-17 ENCOUNTER — Encounter (HOSPITAL_COMMUNITY): Payer: Medicare Other

## 2011-07-17 LAB — DIGOXIN LEVEL: Digoxin Level: 1.5 ng/mL (ref 0.8–2.0)

## 2011-07-19 ENCOUNTER — Telehealth: Payer: Self-pay | Admitting: *Deleted

## 2011-07-19 ENCOUNTER — Encounter (HOSPITAL_COMMUNITY)
Admission: RE | Admit: 2011-07-19 | Discharge: 2011-07-19 | Disposition: A | Discharge status: Home / Self Care | Payer: Medicare Other | Source: Ambulatory Visit | Attending: Pulmonary Disease | Admitting: Pulmonary Disease

## 2011-07-19 DIAGNOSIS — D472 Monoclonal gammopathy: Secondary | ICD-10-CM | POA: Insufficient documentation

## 2011-07-19 DIAGNOSIS — Z5189 Encounter for other specified aftercare: Secondary | ICD-10-CM | POA: Insufficient documentation

## 2011-07-19 DIAGNOSIS — I119 Hypertensive heart disease without heart failure: Secondary | ICD-10-CM | POA: Insufficient documentation

## 2011-07-19 DIAGNOSIS — R0609 Other forms of dyspnea: Secondary | ICD-10-CM | POA: Insufficient documentation

## 2011-07-19 DIAGNOSIS — J4489 Other specified chronic obstructive pulmonary disease: Secondary | ICD-10-CM | POA: Insufficient documentation

## 2011-07-19 DIAGNOSIS — J449 Chronic obstructive pulmonary disease, unspecified: Secondary | ICD-10-CM | POA: Insufficient documentation

## 2011-07-19 DIAGNOSIS — E7800 Pure hypercholesterolemia, unspecified: Secondary | ICD-10-CM | POA: Insufficient documentation

## 2011-07-19 DIAGNOSIS — R0989 Other specified symptoms and signs involving the circulatory and respiratory systems: Secondary | ICD-10-CM | POA: Insufficient documentation

## 2011-07-19 NOTE — Telephone Encounter (Signed)
Message copied by Burnell Blanks on Thu Jul 19, 2011  5:14 PM ------      Message from: Cassell Clement      Created: Tue Jul 17, 2011  9:02 PM       Agree digoxin level okay.

## 2011-07-19 NOTE — Telephone Encounter (Signed)
Mailed copy of labs and left message to call if any questions  

## 2011-07-19 NOTE — Progress Notes (Addendum)
Pt returned to pulmonary rehab today in good spirits. Vitals stable 130s/60s-70s 02 SATs 98 - 100% HR 60 on room air. Discussed medication changes with pt with understanding of new meds and benefits. Pt check out early for appointment without complaint.   Rosalie Doctor

## 2011-07-24 ENCOUNTER — Encounter (HOSPITAL_COMMUNITY): Payer: Medicare Other

## 2011-07-26 ENCOUNTER — Encounter (HOSPITAL_COMMUNITY)
Admission: RE | Admit: 2011-07-26 | Discharge: 2011-07-26 | Disposition: A | Discharge status: Home / Self Care | Payer: Medicare Other | Source: Ambulatory Visit | Attending: Pulmonary Disease | Admitting: Pulmonary Disease

## 2011-07-26 DIAGNOSIS — R0609 Other forms of dyspnea: Secondary | ICD-10-CM | POA: Insufficient documentation

## 2011-07-26 DIAGNOSIS — R0989 Other specified symptoms and signs involving the circulatory and respiratory systems: Secondary | ICD-10-CM | POA: Insufficient documentation

## 2011-07-26 DIAGNOSIS — J449 Chronic obstructive pulmonary disease, unspecified: Secondary | ICD-10-CM | POA: Insufficient documentation

## 2011-07-26 DIAGNOSIS — I119 Hypertensive heart disease without heart failure: Secondary | ICD-10-CM | POA: Insufficient documentation

## 2011-07-26 DIAGNOSIS — E7800 Pure hypercholesterolemia, unspecified: Secondary | ICD-10-CM | POA: Insufficient documentation

## 2011-07-26 DIAGNOSIS — J4489 Other specified chronic obstructive pulmonary disease: Secondary | ICD-10-CM | POA: Insufficient documentation

## 2011-07-26 DIAGNOSIS — Z5189 Encounter for other specified aftercare: Secondary | ICD-10-CM | POA: Insufficient documentation

## 2011-07-26 DIAGNOSIS — D472 Monoclonal gammopathy: Secondary | ICD-10-CM | POA: Insufficient documentation

## 2011-07-26 NOTE — Progress Notes (Signed)
Today at exercise, we noted an irregular heart rate, rhythm strip on Zoll monitor shows a fib.  We noted per her chart recent change of medications for same.  We will fax and scan into her chart to Dr. Patty Sermons. Asymptomatic.

## 2011-07-31 ENCOUNTER — Encounter (HOSPITAL_COMMUNITY): Payer: Medicare Other

## 2011-08-02 ENCOUNTER — Encounter (HOSPITAL_COMMUNITY): Payer: Medicare Other

## 2011-08-07 ENCOUNTER — Encounter (HOSPITAL_COMMUNITY): Payer: Medicare Other

## 2011-08-09 ENCOUNTER — Encounter (HOSPITAL_COMMUNITY): Payer: Medicare Other

## 2011-08-14 ENCOUNTER — Encounter (HOSPITAL_COMMUNITY): Admission: RE | Admit: 2011-08-14 | Payer: Medicare Other | Source: Ambulatory Visit

## 2011-08-16 ENCOUNTER — Encounter (HOSPITAL_COMMUNITY): Payer: Medicare Other

## 2011-08-21 ENCOUNTER — Encounter (HOSPITAL_COMMUNITY): Payer: Medicare Other

## 2011-08-23 ENCOUNTER — Encounter (HOSPITAL_COMMUNITY): Payer: Medicare Other

## 2011-09-03 NOTE — Progress Notes (Signed)
Pulmonary Rehab Discharge Note  Patient completed 6 sessions out of 24 allowed. Patient very non compliant with attendance. Patient called several times regarding attendance and patient always had another health concern or QOL issue with her recent family member death. Patient was encouraged to come back but refused each time. Patient stated we would take her back to ER for her irregular HR so she did not want to return. Patient never completed post paperwork. Patient can back to the pulmonary rehab program with a new MD order at any time.  Courtney L. Manson Passey, MS, NASM, CES   Pulmonary Rehab Nutrition Discharge note Pt with reasonable blood glucose control given pt age. Last A1c 7.5. There are some ways pt can make her eating habits healthier. Per nutrition screen, pt was consuming more than 3 alcoholic drinks daily. Pt was on marinol to help stimulate her appetite.  Mickle Plumb, M.Ed, RD, LDN, CDE 09/14/11 1204  Agree with above.  Cathie Olden RN

## 2011-09-20 ENCOUNTER — Ambulatory Visit (INDEPENDENT_AMBULATORY_CARE_PROVIDER_SITE_OTHER): Payer: Medicare Other | Admitting: Cardiology

## 2011-09-20 ENCOUNTER — Encounter: Payer: Self-pay | Admitting: Cardiology

## 2011-09-20 VITALS — BP 116/67 | HR 46 | Resp 18 | Ht 65.0 in | Wt 137.0 lb

## 2011-09-20 DIAGNOSIS — I119 Hypertensive heart disease without heart failure: Secondary | ICD-10-CM

## 2011-09-20 DIAGNOSIS — R0989 Other specified symptoms and signs involving the circulatory and respiratory systems: Secondary | ICD-10-CM

## 2011-09-20 DIAGNOSIS — I4891 Unspecified atrial fibrillation: Secondary | ICD-10-CM

## 2011-09-20 DIAGNOSIS — F329 Major depressive disorder, single episode, unspecified: Secondary | ICD-10-CM

## 2011-09-20 DIAGNOSIS — I7789 Other specified disorders of arteries and arterioles: Secondary | ICD-10-CM

## 2011-09-20 DIAGNOSIS — E119 Type 2 diabetes mellitus without complications: Secondary | ICD-10-CM

## 2011-09-20 DIAGNOSIS — F341 Dysthymic disorder: Secondary | ICD-10-CM

## 2011-09-20 NOTE — Progress Notes (Signed)
Erin Vang Date of Birth:  1936-02-03 [facility] [address] [place], [place]  [address] [phone]         Fax   [phone]  History of Present Illness: This pleasant 76 year old woman is seen for a scheduled followup office visit.  She has a history of established atrial fibrillation.  Her fibrillation is well tolerated and rate controlled.  He is not having any symptoms of congestive heart failure.  She is on long-term Xarelto for anticoagulation.  The patient also has a history of COPD and a history of situational depression.  Depression occurred after the death of her husband last year.  The patient's depression has improved since Dr. [name] put her on low-dose Celexa.  Current Outpatient Prescriptions  Medication Sig Dispense Refill  . Calcium Carbonate (CALCIUM 600 PO) Take by mouth daily.       . Cholecalciferol (VITAMIN D PO) Take 2,000 mg by mouth daily.        . citalopram (CELEXA) 10 MG tablet Take 10 mg by mouth daily.      . digoxin (LANOXIN) 0.25 MG tablet Take 1 tablet (250 mcg total) by mouth daily.  90 tablet  3  . Flaxseed, Linseed, (FLAX SEED OIL) 1000 MG CAPS Take 1,000 mg by mouth 3 (three) times daily. Takes 3 daily      . glipiZIDE (GLUCOTROL) 5 MG tablet Take 5 mg by mouth daily.       . Glucosamine-Chondroitin (RA GLUCOSAMINE-CHONDROITIN) 750-600 MG TABS Take by mouth daily.        Marland Kitchen losartan (COZAAR) 50 MG tablet Take 50 mg by mouth. 1/2 daily      . METOPROLOL TARTRATE PO Take 50 mg by mouth 2 (two) times daily.       . Multiple Vitamin (MULTIVITAMIN PO) Take 1 tablet by mouth daily after breakfast.       . Omega-3 Fatty Acids (FISH OIL) 1200 MG CAPS Take by mouth.        . Rivaroxaban (XARELTO) 20 MG TABS Take 20 mg by mouth daily.  90 tablet  3  . vitamin C (ASCORBIC ACID) 500 MG tablet Take 500 mg by mouth daily.        Marland Kitchen albuterol (PROVENTIL HFA;VENTOLIN HFA) 108 (90 BASE) MCG/ACT inhaler Inhale 2 puffs into the lungs every 6  (six) hours as needed. As needed for shortness of breath.      Tery Sanfilippo Sodium (DSS) 100 MG CAPS Take 100 mg by mouth daily.      . simvastatin (ZOCOR) 40 MG tablet Take 40 mg by mouth every evening. Take with dinner.        Allergies  Allergen Reactions  . Erythromycin Hives  . Nitrofurantoin Other (See Comments)    Unknown   . Nitrofurantoin Monohyd Macro Other (See Comments)    Unknown   . Wool Alcohol (Lanolin Alcohol) Hives  . Neomycin-Bacitracin Zn-Polymyx Rash  . Penicillins Hives and Rash  . Sulfonamide Derivatives Hives and Rash    Patient Active Problem List  Diagnoses  . DIABETES MELLITUS, TYPE II  . ANXIETY  . HYPERTENSION  . Atrial fibrillation with RVR  . ALLERGIC RHINITIS  . DYSPNEA  . Osteoarthritis  . Hypercholesterolemia  . Benign hypertensive heart disease without heart failure  . COPD (chronic obstructive pulmonary disease)  . Reactive depression  . Anorexia  . Anemia  . Weight loss, non-intentional  . Bradycardia  . Normal cardiac stress test  . Paroxysmal  atrial fibrillation  . Constipation, chronic    History  Smoking status  . Former Smoker -- 2.0 packs/day for 35 years  . Types: Cigarettes  . Quit date: 04/23/1986  Smokeless tobacco  . Never Used    History  Alcohol Use No    Family History  Problem Relation Age of Onset  . Heart failure Mother     Died 87  . Cancer Father     Died 60    Review of Systems: Constitutional: no fever chills diaphoresis or fatigue or change in weight.  Head and neck: no hearing loss, no epistaxis, no photophobia or visual disturbance. Respiratory: No cough, shortness of breath or wheezing. Cardiovascular: No chest pain peripheral edema, palpitations. Gastrointestinal: No abdominal distention, no abdominal pain, no change in bowel habits hematochezia or melena. Genitourinary: No dysuria, no frequency, no urgency, no nocturia. Musculoskeletal:No arthralgias, no back pain, no gait disturbance or  myalgias. Neurological: No dizziness, no headaches, no numbness, no seizures, no syncope, no weakness, no tremors. Hematologic: No lymphadenopathy, no easy bruising. Psychiatric: No confusion, no hallucinations, no sleep disturbance.    Physical Exam: Filed Vitals:   09/20/11 1442  BP: 116/67  Pulse: 46  Resp:    the general appearance reveals a thin elderly woman in no acute distress.The head and neck exam reveals pupils equal and reactive.  Extraocular movements are full.  There is no scleral icterus.  The mouth and pharynx are normal.  The neck is supple.  The carotids reveal no bruits.  The jugular venous pressure is normal.  The  thyroid is not enlarged.  There is no lymphadenopathy.  The chest is clear to percussion and auscultation.  There are no rales or rhonchi.  Expansion of the chest is symmetrical.  The precordium is quiet.  The first heart sound is normal.  The second heart sound is physiologically split.  There is no murmur gallop rub or click.  There is no abnormal lift or heave.  The abdomen is soft and nontender.  There is a prominent aortic pulsation in the upper epigastrium raising the question of a possible abdominal aortic aneurysm. The bowel sounds are normal.  The liver and spleen are not enlarged.  There are no abdominal masses.  There are no abdominal bruits.  Extremities reveal good pedal pulses.  There is no phlebitis or edema.  There is no cyanosis or clubbing.  Strength is normal and symmetrical in all extremities.  There is no lateralizing weakness.  There are no sensory deficits.  The skin is warm and dry.  There is no rash.  EKG shows atrial fibrillation with a slow ventricular response of 56 and there are nonspecific ST-T wave changes   Assessment / Plan: Continue same medication except cutting back on the dose of losartan. We are sending her for an ultrasound of her abdominal aorta. The patient will be rechecked here in 3 months for followup office visit and  EKG.

## 2011-09-20 NOTE — Patient Instructions (Addendum)
Decrease your Losartan to 25 mg daily (1/2 of the 50 mg, call when you need new Rx)  Your physician has requested that you have an abdominal aorta duplex. During this test, an ultrasound is used to evaluate the aorta. Allow 30 minutes for this exam. Do not eat after midnight the day before and avoid carbonated beverages  Your physician recommends that you schedule a follow-up appointment in: 3 months

## 2011-09-20 NOTE — Assessment & Plan Note (Signed)
The patient has not had any hypoglycemic episodes.  Dr. Neva Seat recently stopped her metformin

## 2011-09-20 NOTE — Assessment & Plan Note (Signed)
The patient has not had any TIA symptoms. 

## 2011-09-20 NOTE — Assessment & Plan Note (Signed)
The patient has been having spells of dizziness and orthostatic hypotension.  We are going to reduce her losartan to 25 mg daily

## 2011-09-20 NOTE — Assessment & Plan Note (Signed)
Her reactive depression is significantly improved with the addition of Celexa

## 2011-09-24 ENCOUNTER — Telehealth: Payer: Self-pay | Admitting: Cardiology

## 2011-09-24 NOTE — Telephone Encounter (Signed)
Spoke with pt's daughter, Eunice Blase. Debbie states Dr [name] had recently changed pt's  diabetes medication. Pt saw Dr Patty Sermons on 09/20/11. Pt's BP medication was decreased because her BP was low 09/20/11.

## 2011-09-24 NOTE — Telephone Encounter (Signed)
Please return call to patient daughter Tedra [place] [phone]  Patient medication was changed, patient passed out.

## 2011-09-24 NOTE — Telephone Encounter (Signed)
I suspect her symptoms were from her diabetes (? Hypoglycemia?) She should keep a record of her BP and heart rate TID for the next several days and keep [place] informed.

## 2011-09-24 NOTE — Telephone Encounter (Signed)
I spoke with pt and she stated that she passed out at church Saturday evening. She was kneeling down. She got hot and passed out.  Pt was taken to church office and was given a cup of juice and a piece of candy. She has not had any other symptoms or  problems since Saturday.  Pt states she did  not know her BP or heart rate on Saturday. Today pt states BP 156/?Marland Kitchen She does not know her heart rate today. Pt will continue to take and record her BP. She states she will also be in contact with Dr [name] about her diabetes medication. I will forward to Dr Patty Sermons for review and recommendations.

## 2011-09-25 NOTE — Telephone Encounter (Signed)
Will forward to [employer] to follow up with pt.

## 2011-09-25 NOTE — Progress Notes (Signed)
Addended by: Reine Just on: 09/25/2011 08:13 PM   Modules accepted: Orders

## 2011-09-25 NOTE — Telephone Encounter (Signed)
Spoke with pt. Pt states she has not had anymore symptoms since Saturday. She is aware of Dr Yevonne Pax recommendations to take and record her BP and heart rate TID for the next several days and keep [place] informed.

## 2011-10-15 ENCOUNTER — Encounter (INDEPENDENT_AMBULATORY_CARE_PROVIDER_SITE_OTHER): Payer: Medicare Other

## 2011-10-15 DIAGNOSIS — I700 Atherosclerosis of aorta: Secondary | ICD-10-CM

## 2011-11-07 ENCOUNTER — Emergency Department (HOSPITAL_COMMUNITY): Payer: Medicare Other

## 2011-11-07 ENCOUNTER — Inpatient Hospital Stay (HOSPITAL_COMMUNITY)
Admission: EM | Admit: 2011-11-07 | Discharge: 2011-11-12 | DRG: 470 | Disposition: A | Discharge status: Skilled Nursing Facility | Payer: Medicare Other | Attending: Internal Medicine | Admitting: Internal Medicine

## 2011-11-07 ENCOUNTER — Encounter (HOSPITAL_COMMUNITY): Payer: Self-pay | Admitting: *Deleted

## 2011-11-07 DIAGNOSIS — J4489 Other specified chronic obstructive pulmonary disease: Secondary | ICD-10-CM | POA: Diagnosis present

## 2011-11-07 DIAGNOSIS — S72002A Fracture of unspecified part of neck of left femur, initial encounter for closed fracture: Secondary | ICD-10-CM

## 2011-11-07 DIAGNOSIS — Z880 Allergy status to penicillin: Secondary | ICD-10-CM

## 2011-11-07 DIAGNOSIS — R634 Abnormal weight loss: Secondary | ICD-10-CM

## 2011-11-07 DIAGNOSIS — E119 Type 2 diabetes mellitus without complications: Secondary | ICD-10-CM

## 2011-11-07 DIAGNOSIS — Z882 Allergy status to sulfonamides status: Secondary | ICD-10-CM

## 2011-11-07 DIAGNOSIS — Z881 Allergy status to other antibiotic agents status: Secondary | ICD-10-CM

## 2011-11-07 DIAGNOSIS — E7800 Pure hypercholesterolemia, unspecified: Secondary | ICD-10-CM

## 2011-11-07 DIAGNOSIS — E785 Hyperlipidemia, unspecified: Secondary | ICD-10-CM | POA: Diagnosis present

## 2011-11-07 DIAGNOSIS — D649 Anemia, unspecified: Secondary | ICD-10-CM

## 2011-11-07 DIAGNOSIS — Y92009 Unspecified place in unspecified non-institutional (private) residence as the place of occurrence of the external cause: Secondary | ICD-10-CM

## 2011-11-07 DIAGNOSIS — F3289 Other specified depressive episodes: Secondary | ICD-10-CM | POA: Diagnosis present

## 2011-11-07 DIAGNOSIS — Z888 Allergy status to other drugs, medicaments and biological substances status: Secondary | ICD-10-CM

## 2011-11-07 DIAGNOSIS — J449 Chronic obstructive pulmonary disease, unspecified: Secondary | ICD-10-CM

## 2011-11-07 DIAGNOSIS — Z87891 Personal history of nicotine dependence: Secondary | ICD-10-CM

## 2011-11-07 DIAGNOSIS — R630 Anorexia: Secondary | ICD-10-CM

## 2011-11-07 DIAGNOSIS — F411 Generalized anxiety disorder: Secondary | ICD-10-CM

## 2011-11-07 DIAGNOSIS — IMO0001 Reserved for inherently not codable concepts without codable children: Secondary | ICD-10-CM

## 2011-11-07 DIAGNOSIS — J309 Allergic rhinitis, unspecified: Secondary | ICD-10-CM

## 2011-11-07 DIAGNOSIS — I119 Hypertensive heart disease without heart failure: Secondary | ICD-10-CM

## 2011-11-07 DIAGNOSIS — M1990 Unspecified osteoarthritis, unspecified site: Secondary | ICD-10-CM

## 2011-11-07 DIAGNOSIS — W108XXA Fall (on) (from) other stairs and steps, initial encounter: Secondary | ICD-10-CM | POA: Diagnosis present

## 2011-11-07 DIAGNOSIS — Z79899 Other long term (current) drug therapy: Secondary | ICD-10-CM

## 2011-11-07 DIAGNOSIS — D6489 Other specified anemias: Secondary | ICD-10-CM | POA: Diagnosis present

## 2011-11-07 DIAGNOSIS — R001 Bradycardia, unspecified: Secondary | ICD-10-CM

## 2011-11-07 DIAGNOSIS — F329 Major depressive disorder, single episode, unspecified: Secondary | ICD-10-CM

## 2011-11-07 DIAGNOSIS — K5909 Other constipation: Secondary | ICD-10-CM

## 2011-11-07 DIAGNOSIS — S72033A Displaced midcervical fracture of unspecified femur, initial encounter for closed fracture: Principal | ICD-10-CM | POA: Diagnosis present

## 2011-11-07 DIAGNOSIS — I480 Paroxysmal atrial fibrillation: Secondary | ICD-10-CM

## 2011-11-07 DIAGNOSIS — I10 Essential (primary) hypertension: Secondary | ICD-10-CM

## 2011-11-07 DIAGNOSIS — D72829 Elevated white blood cell count, unspecified: Secondary | ICD-10-CM | POA: Diagnosis present

## 2011-11-07 DIAGNOSIS — R0602 Shortness of breath: Secondary | ICD-10-CM

## 2011-11-07 DIAGNOSIS — I4891 Unspecified atrial fibrillation: Secondary | ICD-10-CM

## 2011-11-07 DIAGNOSIS — Z7901 Long term (current) use of anticoagulants: Secondary | ICD-10-CM

## 2011-11-07 LAB — COMPREHENSIVE METABOLIC PANEL
AST: 36 U/L (ref 0–37)
Albumin: 4 g/dL (ref 3.5–5.2)
BUN: 18 mg/dL (ref 6–23)
Creatinine, Ser: 0.71 mg/dL (ref 0.50–1.10)
Potassium: 4.1 mEq/L (ref 3.5–5.1)
Total Protein: 7.2 g/dL (ref 6.0–8.3)

## 2011-11-07 LAB — CBC
HCT: 40.3 % (ref 36.0–46.0)
MCHC: 34.5 g/dL (ref 30.0–36.0)
MCV: 88.2 fL (ref 78.0–100.0)
Platelets: 231 10*3/uL (ref 150–400)
RDW: 12 % (ref 11.5–15.5)
WBC: 17.2 10*3/uL — ABNORMAL HIGH (ref 4.0–10.5)

## 2011-11-07 LAB — DIGOXIN LEVEL: Digoxin Level: 1.3 ng/mL (ref 0.8–2.0)

## 2011-11-07 MED ORDER — ONDANSETRON HCL 4 MG/2ML IJ SOLN
4.0000 mg | Freq: Once | INTRAMUSCULAR | Status: AC
  Administered 2011-11-07: 4 mg via INTRAVENOUS
  Filled 2011-11-07: qty 2

## 2011-11-07 MED ORDER — SODIUM CHLORIDE 0.9 % IV SOLN
INTRAVENOUS | Status: DC
  Administered 2011-11-07: 23:00:00 via INTRAVENOUS

## 2011-11-07 MED ORDER — FENTANYL CITRATE 0.05 MG/ML IJ SOLN
25.0000 ug | INTRAMUSCULAR | Status: DC | PRN
  Administered 2011-11-07: 25 ug via INTRAVENOUS
  Filled 2011-11-07: qty 2

## 2011-11-07 NOTE — ED Provider Notes (Signed)
History     CSN: [id number]  Arrival date & time 11/07/11  2205   First MD Initiated Contact with Patient 11/07/11 2253      Chief Complaint  Patient presents with  . Fall  . Hip Pain    (Consider location/radiation/quality/duration/timing/severity/associated sxs/prior treatment) HPI History provided by patient. At home tonight and missed a step, fell and landed on her left hip. Presents complaining of left hip pain without weakness or numbness. She denies any other injuries with fall. No head or neck injury or pain. No upper extremity pain or trauma. No chest pain shortness of breath. Abdominal pain. She takes Xarelto for afib. No recent fever or illness. She's had some intermittent dizziness recently which she does not attribute to falling- states she missed a step. Pain is moderate to severe, sharp in quality and not radiating from left hip. No known alleviating factors. Worse with any movement. Past Medical History  Diagnosis Date  . Hypertension   . Diabetes mellitus   . Osteoarthritis   . Hypercholesterolemia   . Paroxysmal atrial fibrillation     CHADS2: 2; manged with rate control and anticoagulation.   . Syncope and collapse     When patient stands up real quickly  . Normal cardiac stress test     a.  lexiscan MV - EF 72%, No ischemia  . Anemia     ? of lab error vs GI bleed  . COPD (chronic obstructive pulmonary disease)   . Weight loss   . Depression   . Prolonged grief reaction   . Chronic anticoagulation     On Xarelto    Past Surgical History  Procedure Date  . Gallbladder surgery   . Appendectomy     age 19  . Cervical spine surgery jan 2011    Family History  Problem Relation Age of Onset  . Heart failure Mother     Died 84  . Cancer Father     Died 46    History  Substance Use Topics  . Smoking status: Former Smoker -- 2.0 packs/day for 35 years    Types: Cigarettes    Quit date: 04/23/1986  . Smokeless tobacco: Never Used  . Alcohol Use:  No    OB History    Grav Para Term Preterm Abortions TAB SAB Ect Mult Living                  Review of Systems  Constitutional: Negative for fever and chills.  HENT: Negative for neck pain and neck stiffness.   Eyes: Negative for pain.  Respiratory: Negative for shortness of breath.   Cardiovascular: Negative for chest pain.  Gastrointestinal: Negative for abdominal pain.  Genitourinary: Negative for dysuria.  Skin: Negative for rash.  Neurological: Negative for headaches.  All other systems reviewed and are negative.    Allergies  Erythromycin; Nitrofurantoin; Nitrofurantoin monohyd macro; Wool alcohol; Neomycin-bacitracin zn-polymyx; Penicillins; and Sulfonamide derivatives  Home Medications   Current Outpatient Rx  Name Route Sig Dispense Refill  . CALCIUM 600 PO Oral Take 1 tablet by mouth daily.     Marland Kitchen VITAMIN D PO Oral Take 2,000 mg by mouth daily.      Marland Kitchen CITALOPRAM HYDROBROMIDE 40 MG PO TABS Oral Take 40 mg by mouth daily.    Marland Kitchen DIGOXIN 0.25 MG PO TABS Oral Take 1 tablet (250 mcg total) by mouth daily. 90 tablet 3  . FLAX SEED OIL 1000 MG PO CAPS Oral Take 1,000 mg  by mouth 3 (three) times daily.     Marland Kitchen GLIPIZIDE 5 MG PO TABS Oral Take 10 mg by mouth 2 (two) times daily before a meal.    . GLUCOSAMINE-CHONDROITIN 750-600 MG PO TABS Oral Take 1 tablet by mouth daily.     Marland Kitchen LOSARTAN POTASSIUM 50 MG PO TABS Oral Take 25 mg by mouth daily.     Marland Kitchen METOPROLOL TARTRATE PO Oral Take 50 mg by mouth 2 (two) times daily.     . MULTIVITAMIN PO Oral Take 1 tablet by mouth daily after breakfast.     . FISH OIL 1200 MG PO CAPS Oral Take 1 capsule by mouth daily.     Marland Kitchen RIVAROXABAN 20 MG PO TABS Oral Take 20 mg by mouth daily. 90 tablet 3  . SIMVASTATIN 40 MG PO TABS Oral Take 40 mg by mouth every evening. Take with dinner.    Marland Kitchen VITAMIN C 500 MG PO TABS Oral Take 500 mg by mouth daily.        BP 161/78  Pulse 54  Temp 98.9 F (37.2 C) (Oral)  Resp 19  SpO2 94%  Physical Exam    Constitutional: She is oriented to person, place, and time. She appears well-developed and well-nourished.  HENT:  Head: Normocephalic and atraumatic.  Eyes: Conjunctivae and EOM are normal. Pupils are equal, round, and reactive to light.  Neck: Trachea normal. Neck supple.       No midline tenderness or deformity  Cardiovascular: S1 normal, S2 normal and normal pulses.     No systolic murmur is present   No diastolic murmur is present  Pulses:      Radial pulses are 2+ on the right side, and 2+ on the left side.       Irregular  Pulmonary/Chest: Effort normal and breath sounds normal. She has no wheezes. She has no rhonchi. She has no rales. She exhibits no tenderness.  Abdominal: Soft. Normal appearance and bowel sounds are normal. There is no tenderness. There is no CVA tenderness and negative Murphy's sign.  Musculoskeletal:       No thoracic or lumbar tenderness. Left lower extremity with tenderness over area of the greater trochanter, skin intact, extremity shortening with distal pulses equal and dorsalis pedis and distal motor and sensory intact. No tenderness over knee, ankle or foot. Pelvis is stable.  Neurological: She is alert and oriented to person, place, and time. She has normal strength. No cranial nerve deficit or sensory deficit. GCS eye subscore is 4. GCS verbal subscore is 5. GCS motor subscore is 6.  Skin: Skin is warm and dry. No rash noted. She is not diaphoretic.  Psychiatric: Her speech is normal.       Cooperative and appropriate    ED Course  Procedures (including critical care time)  Results for orders placed during the hospital encounter of 11/07/11  DIGOXIN LEVEL      Component Value Range   Digoxin Level 1.3  0.8 - 2.0 ng/mL  CBC      Component Value Range   WBC 17.2 (*) 4.0 - 10.5 K/uL   RBC 4.57  3.87 - 5.11 MIL/uL   Hemoglobin 13.9  12.0 - 15.0 g/dL   HCT 16.1  09.6 - 04.5 %   MCV 88.2  78.0 - 100.0 fL   MCH 30.4  26.0 - 34.0 pg   MCHC 34.5   30.0 - 36.0 g/dL   RDW 40.9  81.1 - 91.4 %  Platelets 231  150 - 400 K/uL  COMPREHENSIVE METABOLIC PANEL      Component Value Range   Sodium 143  135 - 145 mEq/L   Potassium 4.1  3.5 - 5.1 mEq/L   Chloride 103  96 - 112 mEq/L   CO2 24  19 - 32 mEq/L   Glucose, Bld 95  70 - 99 mg/dL   BUN 18  6 - 23 mg/dL   Creatinine, Ser 1.61  0.50 - 1.10 mg/dL   Calcium 09.6 (*) 8.4 - 10.5 mg/dL   Total Protein 7.2  6.0 - 8.3 g/dL   Albumin 4.0  3.5 - 5.2 g/dL   AST 36  0 - 37 U/L   ALT 25  0 - 35 U/L   Alkaline Phosphatase 75  39 - 117 U/L   Total Bilirubin 0.4  0.3 - 1.2 mg/dL   GFR calc non Af Amer 83 (*) >90 mL/min   GFR calc Af Amer >90  >90 mL/min  TYPE AND SCREEN      Component Value Range   ABO/RH(D) A POS     Antibody Screen NEG     Sample Expiration 11/10/2011     Dg Chest 1 View  11/07/2011  *RADIOLOGY REPORT*  Clinical Data: Left hip pain after fall.  CHEST - 1 VIEW  Comparison: 05/24/2011  Findings: Borderline heart size and pulmonary vascularity.  Mild pulmonary vascular congestion is not excluded.  Interstitial fibrosis and peribronchial thickening suggest chronic bronchitis. Emphysematous changes in the lungs.  No blunting of costophrenic angles.  No focal consolidation.  No pneumothorax.  Calcification of the aorta.  IMPRESSION: Borderline heart size and pulmonary vascularity may represent early vascular congestion.  No edema.  Emphysema and fibrosis with chronic bronchitic changes.  Original Report Authenticated By: Marlon Pel, M.D.   Dg Hip Complete Left  11/07/2011  *RADIOLOGY REPORT*  Clinical Data: Left hip pain after fall.  LEFT HIP - COMPLETE 2+ VIEW  Comparison: None.  Findings: Acute transverse fracture of the left femoral neck with superior subluxation of the distal fracture fragment.  No dislocation of the acetabular joint.  No focal bone lesion is not demonstrated.  Visualized pelvis, sacrum, and right hip appear intact.  Degenerative changes in the lower lumbar  spine and hips.  IMPRESSION: Mostly transverse fracture of the left femoral neck with superior subluxation of distal fracture fragment.  Original Report Authenticated By: Marlon Pel, M.D.      Date: 11/07/2011  Rate: 99  Rhythm: atrial fibrillation  QRS Axis: normal  Intervals: normal  ST/T Wave abnormalities: nonspecific ST changes  Conduction Disutrbances:none  Narrative Interpretation:   Old EKG Reviewed: unchanged  11:49 PM d/w RN in the OR who relayed info directly to DR Charlann Boxer - he will evaluate PT for operative repair, plan MED admit.   IV fentanyl. IV Zofran. Serial exams, improving pain.  12:33 AM discussed with [facility] hospitalist on-call Dr.Kakrkakandy, will admit  MDM   Fall with left hip fracture. Nursing notes reviewed. Labs reviewed. Vital signs reviewed. Imaging reviewed as above. IV narcotics pain control and medical admission.        Sunnie Nielsen, MD 11/08/11 [id number]

## 2011-11-07 NOTE — ED Notes (Signed)
Pt was going out on the step and missed a step and fell down onto the ground on her left hip. She is c/o left hip pain. Unable to straighten left hip and lle. Pt reports having "some dizziness lately"

## 2011-11-08 ENCOUNTER — Encounter (HOSPITAL_COMMUNITY): Payer: Self-pay | Admitting: Anesthesiology

## 2011-11-08 ENCOUNTER — Inpatient Hospital Stay (HOSPITAL_COMMUNITY): Payer: Medicare Other | Admitting: Anesthesiology

## 2011-11-08 ENCOUNTER — Encounter (HOSPITAL_COMMUNITY)
Admission: EM | Disposition: A | Discharge status: Skilled Nursing Facility | Payer: Self-pay | Source: Home / Self Care | Attending: Internal Medicine

## 2011-11-08 ENCOUNTER — Encounter (HOSPITAL_COMMUNITY): Payer: Self-pay | Admitting: *Deleted

## 2011-11-08 ENCOUNTER — Inpatient Hospital Stay (HOSPITAL_COMMUNITY): Payer: Medicare Other

## 2011-11-08 DIAGNOSIS — E7800 Pure hypercholesterolemia, unspecified: Secondary | ICD-10-CM

## 2011-11-08 DIAGNOSIS — Z01810 Encounter for preprocedural cardiovascular examination: Secondary | ICD-10-CM

## 2011-11-08 DIAGNOSIS — E119 Type 2 diabetes mellitus without complications: Secondary | ICD-10-CM

## 2011-11-08 DIAGNOSIS — I119 Hypertensive heart disease without heart failure: Secondary | ICD-10-CM

## 2011-11-08 DIAGNOSIS — J449 Chronic obstructive pulmonary disease, unspecified: Secondary | ICD-10-CM

## 2011-11-08 DIAGNOSIS — I4891 Unspecified atrial fibrillation: Secondary | ICD-10-CM

## 2011-11-08 DIAGNOSIS — S72009A Fracture of unspecified part of neck of unspecified femur, initial encounter for closed fracture: Secondary | ICD-10-CM

## 2011-11-08 DIAGNOSIS — S72002A Fracture of unspecified part of neck of left femur, initial encounter for closed fracture: Secondary | ICD-10-CM

## 2011-11-08 HISTORY — PX: HIP ARTHROPLASTY: SHX981

## 2011-11-08 LAB — GLUCOSE, CAPILLARY
Glucose-Capillary: 134 mg/dL — ABNORMAL HIGH (ref 70–99)
Glucose-Capillary: 64 mg/dL — ABNORMAL LOW (ref 70–99)
Glucose-Capillary: 127 mg/dL — ABNORMAL HIGH (ref 70–99)
Glucose-Capillary: 140 mg/dL — ABNORMAL HIGH (ref 70–99)

## 2011-11-08 LAB — CBC
MCV: 90.1 fL (ref 78.0–100.0)
Platelets: 185 10*3/uL (ref 150–400)
RBC: 4.06 MIL/uL (ref 3.87–5.11)
RDW: 12.1 % (ref 11.5–15.5)
WBC: 11.5 10*3/uL — ABNORMAL HIGH (ref 4.0–10.5)

## 2011-11-08 LAB — COMPREHENSIVE METABOLIC PANEL
Albumin: 3.2 g/dL — ABNORMAL LOW (ref 3.5–5.2)
Alkaline Phosphatase: 73 U/L (ref 39–117)
BUN: 18 mg/dL (ref 6–23)
Chloride: 104 mEq/L (ref 96–112)
Creatinine, Ser: 0.76 mg/dL (ref 0.50–1.10)
GFR calc Af Amer: >90 mL/min (ref >90)
GFR calc non Af Amer: 81 mL/min — ABNORMAL LOW (ref >90)
Glucose, Bld: 120 mg/dL — ABNORMAL HIGH (ref 70–99)
Potassium: 4.3 mEq/L (ref 3.5–5.1)
Total Bilirubin: 0.5 mg/dL (ref 0.3–1.2)

## 2011-11-08 LAB — DIGOXIN LEVEL: Digoxin Level: 1.6 ng/mL (ref 0.8–2.0)

## 2011-11-08 SURGERY — HEMIARTHROPLASTY, HIP, DIRECT ANTERIOR APPROACH, FOR FRACTURE
Anesthesia: General | Site: Hip | Laterality: Left | Wound class: Clean

## 2011-11-08 MED ORDER — ONDANSETRON HCL 4 MG PO TABS: 4.0000 mg | ORAL_TABLET | Freq: Four times a day (QID) | ORAL | Status: DC | PRN

## 2011-11-08 MED ORDER — MAGNESIUM SULFATE 40 MG/ML IJ SOLN
2.0000 g | Freq: Once | INTRAMUSCULAR | Status: AC
  Administered 2011-11-08: 2 g via INTRAVENOUS
  Filled 2011-11-08: qty 50

## 2011-11-08 MED ORDER — MAGNESIUM SULFATE 50 % IJ SOLN: 2.0000 g | Freq: Once | INTRAMUSCULAR | Status: DC

## 2011-11-08 MED ORDER — ACETAMINOPHEN 650 MG RE SUPP: 650.0000 mg | Freq: Four times a day (QID) | RECTAL | Status: DC | PRN

## 2011-11-08 MED ORDER — METHOCARBAMOL 100 MG/ML IJ SOLN: 500.0000 mg | Freq: Four times a day (QID) | INTRAVENOUS | Status: DC | PRN

## 2011-11-08 MED ORDER — SODIUM CHLORIDE 0.9 % IJ SOLN: 3.0000 mL | Freq: Two times a day (BID) | INTRAMUSCULAR | Status: DC

## 2011-11-08 MED ORDER — SODIUM CHLORIDE 0.9 % IV SOLN
INTRAVENOUS | Status: DC | PRN
  Administered 2011-11-08: 21:00:00 via INTRAVENOUS

## 2011-11-08 MED ORDER — ROCURONIUM BROMIDE 100 MG/10ML IV SOLN
INTRAVENOUS | Status: DC | PRN
  Administered 2011-11-08: 25 mg via INTRAVENOUS

## 2011-11-08 MED ORDER — SODIUM CHLORIDE 0.9 % IR SOLN
Status: DC | PRN
  Administered 2011-11-08: 1000 mL

## 2011-11-08 MED ORDER — NEOSTIGMINE METHYLSULFATE 1 MG/ML IJ SOLN
INTRAMUSCULAR | Status: DC | PRN
  Administered 2011-11-08: 3 mg via INTRAVENOUS

## 2011-11-08 MED ORDER — PHENOL 1.4 % MT LIQD: 1.0000 | OROMUCOSAL | Status: DC | PRN

## 2011-11-08 MED ORDER — ONDANSETRON HCL 4 MG/2ML IJ SOLN: 4.0000 mg | Freq: Four times a day (QID) | INTRAMUSCULAR | Status: DC | PRN

## 2011-11-08 MED ORDER — MENTHOL 3 MG MT LOZG: 1.0000 | LOZENGE | OROMUCOSAL | Status: DC | PRN

## 2011-11-08 MED ORDER — SIMVASTATIN 40 MG PO TABS
40.0000 mg | ORAL_TABLET | Freq: Every evening | ORAL | Status: DC
  Administered 2011-11-09: 40 mg via ORAL
  Filled 2011-11-08 (×5): qty 1

## 2011-11-08 MED ORDER — SODIUM CHLORIDE 0.9 % IV SOLN
INTRAVENOUS | Status: DC
  Administered 2011-11-09: 01:00:00 via INTRAVENOUS
  Filled 2011-11-08 (×3): qty 1000

## 2011-11-08 MED ORDER — ALBUTEROL SULFATE (5 MG/ML) 0.5% IN NEBU: 2.5000 mg | INHALATION_SOLUTION | RESPIRATORY_TRACT | Status: DC | PRN

## 2011-11-08 MED ORDER — GLYCOPYRROLATE 0.2 MG/ML IJ SOLN
INTRAMUSCULAR | Status: DC | PRN
  Administered 2011-11-08: .6 mg via INTRAVENOUS

## 2011-11-08 MED ORDER — CHLORHEXIDINE GLUCONATE 0.12 % MT SOLN
15.0000 mL | Freq: Two times a day (BID) | OROMUCOSAL | Status: DC
  Administered 2011-11-08 – 2011-11-12 (×8): 15 mL via OROMUCOSAL
  Filled 2011-11-08 (×11): qty 15

## 2011-11-08 MED ORDER — DOCUSATE SODIUM 100 MG PO CAPS
100.0000 mg | ORAL_CAPSULE | Freq: Two times a day (BID) | ORAL | Status: DC
  Administered 2011-11-09 – 2011-11-12 (×7): 100 mg via ORAL
  Filled 2011-11-08 (×9): qty 1

## 2011-11-08 MED ORDER — METHOCARBAMOL 500 MG PO TABS
500.0000 mg | ORAL_TABLET | Freq: Four times a day (QID) | ORAL | Status: DC | PRN
  Filled 2011-11-08: qty 1

## 2011-11-08 MED ORDER — HYDROMORPHONE HCL PF 1 MG/ML IJ SOLN
0.2500 mg | INTRAMUSCULAR | Status: DC | PRN
  Administered 2011-11-09: 0.5 mg via INTRAVENOUS
  Filled 2011-11-08: qty 1

## 2011-11-08 MED ORDER — OMEGA-3-ACID ETHYL ESTERS 1 G PO CAPS
1.0000 g | ORAL_CAPSULE | Freq: Every day | ORAL | Status: DC
  Administered 2011-11-08 – 2011-11-12 (×5): 1 g via ORAL
  Filled 2011-11-08 (×5): qty 1

## 2011-11-08 MED ORDER — HYDROMORPHONE HCL PF 1 MG/ML IJ SOLN
1.0000 mg | Freq: Once | INTRAMUSCULAR | Status: AC
  Administered 2011-11-08: 1 mg via INTRAVENOUS
  Filled 2011-11-08: qty 1

## 2011-11-08 MED ORDER — MORPHINE SULFATE 2 MG/ML IJ SOLN
0.5000 mg | INTRAMUSCULAR | Status: DC | PRN
  Administered 2011-11-09 – 2011-11-10 (×2): 0.5 mg via INTRAVENOUS
  Filled 2011-11-08 (×2): qty 1

## 2011-11-08 MED ORDER — LIDOCAINE HCL (CARDIAC) 20 MG/ML IV SOLN
INTRAVENOUS | Status: DC | PRN
  Administered 2011-11-08: 60 mg via INTRAVENOUS

## 2011-11-08 MED ORDER — FENTANYL CITRATE 0.05 MG/ML IJ SOLN
INTRAMUSCULAR | Status: DC | PRN
  Administered 2011-11-08 (×2): 50 ug via INTRAVENOUS

## 2011-11-08 MED ORDER — METOCLOPRAMIDE HCL 5 MG/ML IJ SOLN: 5.0000 mg | Freq: Three times a day (TID) | INTRAMUSCULAR | Status: DC | PRN

## 2011-11-08 MED ORDER — PROPOFOL 10 MG/ML IV BOLUS
INTRAVENOUS | Status: DC | PRN
  Administered 2011-11-08: 70 mg via INTRAVENOUS

## 2011-11-08 MED ORDER — POLYETHYLENE GLYCOL 3350 17 G PO PACK
17.0000 g | PACK | Freq: Two times a day (BID) | ORAL | Status: DC
  Administered 2011-11-09 – 2011-11-12 (×6): 17 g via ORAL
  Filled 2011-11-08 (×9): qty 1

## 2011-11-08 MED ORDER — INSULIN ASPART 100 UNIT/ML ~~LOC~~ SOLN: 0.0000 [IU] | Freq: Three times a day (TID) | SUBCUTANEOUS | Status: DC

## 2011-11-08 MED ORDER — INSULIN ASPART 100 UNIT/ML ~~LOC~~ SOLN
0.0000 [IU] | SUBCUTANEOUS | Status: DC
  Administered 2011-11-08: 1 [IU] via SUBCUTANEOUS

## 2011-11-08 MED ORDER — FISH OIL 1200 MG PO CAPS: 1.0000 | ORAL_CAPSULE | Freq: Every day | ORAL | Status: DC

## 2011-11-08 MED ORDER — PHENYLEPHRINE HCL 10 MG/ML IJ SOLN
INTRAMUSCULAR | Status: DC | PRN
  Administered 2011-11-08: 80 ug via INTRAVENOUS

## 2011-11-08 MED ORDER — ONDANSETRON HCL 4 MG/2ML IJ SOLN
INTRAMUSCULAR | Status: DC | PRN
  Administered 2011-11-08: 4 mg via INTRAVENOUS

## 2011-11-08 MED ORDER — CLINDAMYCIN PHOSPHATE 600 MG/50ML IV SOLN
600.0000 mg | Freq: Four times a day (QID) | INTRAVENOUS | Status: AC
  Administered 2011-11-09 (×2): 600 mg via INTRAVENOUS
  Filled 2011-11-08 (×2): qty 50

## 2011-11-08 MED ORDER — HYDROMORPHONE HCL PF 1 MG/ML IJ SOLN
1.0000 mg | INTRAMUSCULAR | Status: DC | PRN
  Administered 2011-11-08 (×2): 1 mg via INTRAVENOUS
  Filled 2011-11-08 (×2): qty 1

## 2011-11-08 MED ORDER — ENOXAPARIN SODIUM 40 MG/0.4ML ~~LOC~~ SOLN
40.0000 mg | SUBCUTANEOUS | Status: DC
  Filled 2011-11-08: qty 0.4

## 2011-11-08 MED ORDER — CITALOPRAM HYDROBROMIDE 40 MG PO TABS
40.0000 mg | ORAL_TABLET | Freq: Every day | ORAL | Status: DC
  Administered 2011-11-08 – 2011-11-10 (×3): 40 mg via ORAL
  Filled 2011-11-08 (×3): qty 1

## 2011-11-08 MED ORDER — SODIUM CHLORIDE 0.9 % IV SOLN: INTRAVENOUS | Status: DC

## 2011-11-08 MED ORDER — CLINDAMYCIN PHOSPHATE 900 MG/50ML IV SOLN
900.0000 mg | INTRAVENOUS | Status: AC
  Administered 2011-11-08: 900 mg via INTRAVENOUS
  Filled 2011-11-08: qty 50

## 2011-11-08 MED ORDER — FERROUS SULFATE 325 (65 FE) MG PO TABS
325.0000 mg | ORAL_TABLET | Freq: Three times a day (TID) | ORAL | Status: DC
  Administered 2011-11-09 – 2011-11-12 (×8): 325 mg via ORAL
  Filled 2011-11-08 (×13): qty 1

## 2011-11-08 MED ORDER — ACETAMINOPHEN 325 MG PO TABS: 650.0000 mg | ORAL_TABLET | Freq: Four times a day (QID) | ORAL | Status: DC | PRN

## 2011-11-08 MED ORDER — HYDROCODONE-ACETAMINOPHEN 5-325 MG PO TABS
1.0000 | ORAL_TABLET | Freq: Four times a day (QID) | ORAL | Status: DC | PRN
  Administered 2011-11-09: 2 via ORAL
  Administered 2011-11-10 – 2011-11-11 (×2): 1 via ORAL
  Administered 2011-11-11 – 2011-11-12 (×2): 2 via ORAL
  Filled 2011-11-08 (×2): qty 1
  Filled 2011-11-08 (×3): qty 2

## 2011-11-08 MED ORDER — INSULIN ASPART 100 UNIT/ML ~~LOC~~ SOLN
0.0000 [IU] | Freq: Three times a day (TID) | SUBCUTANEOUS | Status: DC
  Administered 2011-11-09 – 2011-11-10 (×4): 3 [IU] via SUBCUTANEOUS
  Administered 2011-11-10: 5 [IU] via SUBCUTANEOUS

## 2011-11-08 MED ORDER — METOPROLOL TARTRATE 50 MG PO TABS
50.0000 mg | ORAL_TABLET | Freq: Two times a day (BID) | ORAL | Status: DC
  Administered 2011-11-08 – 2011-11-12 (×8): 50 mg via ORAL
  Filled 2011-11-08 (×10): qty 1

## 2011-11-08 MED ORDER — DEXTROSE-NACL 5-0.9 % IV SOLN
INTRAVENOUS | Status: DC
  Administered 2011-11-08: 18:00:00 via INTRAVENOUS

## 2011-11-08 MED ORDER — MAGNESIUM CITRATE PO SOLN: 1.0000 | Freq: Once | ORAL | Status: AC | PRN

## 2011-11-08 MED ORDER — METOCLOPRAMIDE HCL 10 MG PO TABS: 5.0000 mg | ORAL_TABLET | Freq: Three times a day (TID) | ORAL | Status: DC | PRN

## 2011-11-08 MED ORDER — ALUM & MAG HYDROXIDE-SIMETH 200-200-20 MG/5ML PO SUSP: 30.0000 mL | ORAL | Status: DC | PRN

## 2011-11-08 MED ORDER — DEXTROSE 50 % IV SOLN
INTRAVENOUS | Status: AC
  Administered 2011-11-08: 50 mL
  Filled 2011-11-08: qty 50

## 2011-11-08 MED ORDER — DIGOXIN 250 MCG PO TABS
250.0000 ug | ORAL_TABLET | Freq: Every day | ORAL | Status: DC
  Administered 2011-11-08: 250 ug via ORAL
  Filled 2011-11-08: qty 1

## 2011-11-08 MED ORDER — EPHEDRINE SULFATE 50 MG/ML IJ SOLN
INTRAMUSCULAR | Status: DC | PRN
  Administered 2011-11-08: 15 mg via INTRAVENOUS

## 2011-11-08 MED ORDER — BIOTENE DRY MOUTH MT LIQD
15.0000 mL | Freq: Two times a day (BID) | OROMUCOSAL | Status: DC
  Administered 2011-11-08 – 2011-11-11 (×7): 15 mL via OROMUCOSAL

## 2011-11-08 SURGICAL SUPPLY — 49 items
BLADE SAW SAG 73X25 THK (BLADE) ×1
BLADE SAW SGTL 73X25 THK (BLADE) ×1 IMPLANT
BRUSH FEMORAL CANAL (MISCELLANEOUS) IMPLANT
CLOTH BEACON ORANGE TIMEOUT ST (SAFETY) ×2 IMPLANT
COVER SURGICAL LIGHT HANDLE (MISCELLANEOUS) ×2 IMPLANT
DRAPE INCISE IOBAN 85X60 (DRAPES) ×2 IMPLANT
DRAPE ORTHO SPLIT 77X108 STRL (DRAPES) ×4
DRAPE SURG ORHT 6 SPLT 77X108 (DRAPES) ×2 IMPLANT
DRAPE U-SHAPE 47X51 STRL (DRAPES) ×2 IMPLANT
DRSG MEPILEX BORDER 4X8 (GAUZE/BANDAGES/DRESSINGS) ×2 IMPLANT
DURAPREP 26ML APPLICATOR (WOUND CARE) ×2 IMPLANT
ELECT REM PT RETURN 9FT ADLT (ELECTROSURGICAL) ×2
ELECTRODE REM PT RTRN 9FT ADLT (ELECTROSURGICAL) ×1 IMPLANT
EVACUATOR 1/8 PVC DRAIN (DRAIN) ×2 IMPLANT
FACESHIELD LNG OPTICON STERILE (SAFETY) ×4 IMPLANT
GLOVE BIO SURGEON STRL SZ7 (GLOVE) ×1 IMPLANT
GLOVE BIOGEL PI IND STRL 7.5 (GLOVE) ×1 IMPLANT
GLOVE BIOGEL PI IND STRL 8 (GLOVE) ×2 IMPLANT
GLOVE BIOGEL PI INDICATOR 7.5 (GLOVE) ×2
GLOVE BIOGEL PI INDICATOR 8 (GLOVE) ×2
GLOVE ORTHO TXT STRL SZ7.5 (GLOVE) ×3 IMPLANT
GLOVE SURG ORTHO 8.0 STRL STRW (GLOVE) ×2 IMPLANT
GOWN PREVENTION PLUS XXLARGE (GOWN DISPOSABLE) ×1 IMPLANT
GOWN STRL NON-REIN LRG LVL3 (GOWN DISPOSABLE) ×6 IMPLANT
HANDPIECE INTERPULSE COAX TIP (DISPOSABLE)
IMMOBILIZER KNEE 22 UNIV (SOFTGOODS) ×2 IMPLANT
KIT BASIN OR (CUSTOM PROCEDURE TRAY) ×2 IMPLANT
KIT ROOM TURNOVER OR (KITS) ×2 IMPLANT
MANIFOLD NEPTUNE II (INSTRUMENTS) ×2 IMPLANT
NS IRRIG 1000ML POUR BTL (IV SOLUTION) ×2 IMPLANT
PACK TOTAL JOINT (CUSTOM PROCEDURE TRAY) ×2 IMPLANT
PAD ARMBOARD 7.5X6 YLW CONV (MISCELLANEOUS) ×4 IMPLANT
PRESSURIZER FEMORAL UNIV (MISCELLANEOUS) IMPLANT
SET HNDPC FAN SPRY TIP SCT (DISPOSABLE) IMPLANT
SPECIMEN JAR MEDIUM (MISCELLANEOUS) ×1 IMPLANT
SPONGE LAP 4X18 X RAY DECT (DISPOSABLE) ×2 IMPLANT
STAPLER VISISTAT (STAPLE) ×2 IMPLANT
STAPLER VISISTAT 35W (STAPLE) IMPLANT
STRIP CLOSURE SKIN 1/2X4 (GAUZE/BANDAGES/DRESSINGS) ×2 IMPLANT
SUT MNCRL AB 4-0 PS2 18 (SUTURE) ×1 IMPLANT
SUT VIC AB 1 CT1 27 (SUTURE) ×6
SUT VIC AB 1 CT1 27XBRD ANBCTR (SUTURE) IMPLANT
SUT VIC AB 2-0 CT1 27 (SUTURE) ×4
SUT VIC AB 2-0 CT1 TAPERPNT 27 (SUTURE) IMPLANT
TOWEL OR 17X24 6PK STRL BLUE (TOWEL DISPOSABLE) ×2 IMPLANT
TOWEL OR 17X26 10 PK STRL BLUE (TOWEL DISPOSABLE) ×2 IMPLANT
TOWER CARTRIDGE SMART MIX (DISPOSABLE) IMPLANT
TRAY FOLEY CATH 14FR (SET/KITS/TRAYS/PACK) IMPLANT
WATER STERILE IRR 1000ML POUR (IV SOLUTION) ×8 IMPLANT

## 2011-11-08 NOTE — Anesthesia Postprocedure Evaluation (Signed)
  Anesthesia Post-op Note  Patient: Erin Vang  Procedure(s) Performed: Procedure(s) (LRB): ARTHROPLASTY BIPOLAR HIP (Left)  Patient Location: PACU  Anesthesia Type: General  Level of Consciousness: awake  Airway and Oxygen Therapy: Patient Spontanous Breathing and Patient connected to nasal cannula oxygen  Post-op Pain: none  Post-op Assessment: Post-op Vital signs reviewed, Patient's Cardiovascular Status Stable, Respiratory Function Stable, Patent Airway and No signs of Nausea or vomiting  Post-op Vital Signs: Reviewed and stable  Complications: No apparent anesthesia complications

## 2011-11-08 NOTE — Consult Note (Signed)
Cardiology Consult Note   [name] MRN: [id number], DOB/AGE: 1936/01/19   Admit date: 11/07/2011 Date of Consult: 11/08/2011  Primary Physician: Kimber Relic, MD Primary Cardiologist: Cassell Clement, MD  Pt. Profile: Erin Vang is a 76yo female with PMHx significant for paroxysmal atrial fibrillation (on chronic Xarelto, managed with rate-control), type 2 DM, HTN, HL and COPD who was admitted to [facility] on 11/08/11 for fall with resultant traumatic left femoral neck fracture.   Reason for consult: evaluation/management of atrial fibrillation and pre-operative evaluation  HPI:   The patient was seen in consultation in 05/2011. At that time, she was noted to have PAF occuring during pulmonary rehab, and in the setting of suspected anemia requiring blood transfusions. The patient converted to NSR on cardizem IV and digoxin, however she became bradycardic with pauses > 4.0 seconds. Digoxin was discontinued. She was asymptomatic. There was a suspicion for tachybrady syndrome. Hgb 7.6 to 16.1 post-transfusion was curious for lab error. The patient did not allow the medicine team to repeat CBC, and this was set to be rechecked on PCP follow-up. FOBT was negative. Subsequent readings since have been WNL. An echo that admission revealed normal EF and mild LVH. She was discharged on Lopressor. On office follow-up later that month, she was found to be back in atrial fibrillation, digoxin was re-started, she was started on Xarelto given no objective evidence of GIB. Event monitor was arranged, a documented some nocturnal pauses (durations not mentioned). Close follow-up revealed conversion and maintenance of NSR. Digoxin was increased for better rate-control.   Erin Vang last followed up with Dr. Patty Sermons in 08/2011. Her atrial fibrillation was assessed to be well-controlled on rate-control (Lopressor 50mg  PO BID) and digoxin PO daily. Additionally, she reported  compliance with Xarelto (CHADSVASc = 4) and denied TIA symptoms. There were no signs or symptoms of heart failure. Losartan was reduced due to dizzy spells and orthostatic hypotension. DM was felt to be well-controlled. She had been recently taken off Metformin. A1C was 6.3 in 04/2011.  From an ischemic perspective, there is no history or complaints of symptoms worrisome for ACS/CAD. She has mentioned exertional dyspnea in the past, but this was believed to be secondary to COPD. She did undergo nuclear stress testing 09/21/10 which showed no evidence fo ischemia, EF 72%. She had been on Lasix in 2012 for peripheral edema, however was taken off of this and tolerated it well. Celexa was recently increased. Losartan decreased and Metformin discontinued.   She reports carrying groceries into her house yesterday, and her legs became weak causing her to fall. She reports decreased balance and coordination at baseline. She denies sudden incoordination, unilateral/generalized weakness, imbalance, slurred speech or facial droop. She denies lightheadedness, presyncope or syncope. She denies chest pain at rest or on exertion, shortness of breath, DOE, LE edema, orthopnea, PND, n/v/d, fevers, chills or new cough. From a functional standpoint, she can cross a parking lot and go grocery shopping without incident.   Admission labs reveal digoxin levels of 1.3 this morning and 1.6 this afternoon. CBC reveals a mild leukocytosis. BMET reveals hypoalbuminemia at 3.2 down from 4.0 last night, AST/ALT elevated at 170/79 from last night (36/25). Mg low at 1.4. This was supplemented. EKG last PM reveals course atrial fibrillation, 99 bpm, occasional PVCs new from a tracing in 09/2011. No ST-T wave changes.   Problem List: Past Medical History  Diagnosis Date  . Hypertension   . Diabetes mellitus   .  Osteoarthritis   . Hypercholesterolemia   . Paroxysmal atrial fibrillation     CHADS2: 2; manged with rate control and  anticoagulation.   . Syncope and collapse     When patient stands up real quickly  . Normal cardiac stress test     a.  lexiscan MV - EF 72%, No ischemia  . Anemia     ? of lab error vs GI bleed  . COPD (chronic obstructive pulmonary disease)   . Weight loss   . Depression   . Prolonged grief reaction   . Chronic anticoagulation     On Xarelto    Past Surgical History  Procedure Date  . Gallbladder surgery   . Appendectomy     age 44  . Cervical spine surgery jan 2011     Allergies:  Allergies  Allergen Reactions  . Erythromycin Hives  . Nitrofurantoin Other (See Comments)    Unknown   . Nitrofurantoin Monohyd Macro Other (See Comments)    Unknown   . Wool Alcohol (Lanolin Alcohol) Hives  . Neomycin-Bacitracin Zn-Polymyx Rash  . Penicillins Hives and Rash  . Sulfonamide Derivatives Hives and Rash    Home Medications: Prior to Admission medications   Medication Sig Start Date End Date Taking? Authorizing Provider  Calcium Carbonate (CALCIUM 600 PO) Take 1 tablet by mouth daily.    Yes Historical Provider, MD  Cholecalciferol (VITAMIN D PO) Take 2,000 mg by mouth daily.     Yes Historical Provider, MD  citalopram (CELEXA) 40 MG tablet Take 40 mg by mouth daily.   Yes Historical Provider, MD  digoxin (LANOXIN) 0.25 MG tablet Take 1 tablet (250 mcg total) by mouth daily. 07/16/11 07/15/12 Yes Rosalio Macadamia, NP  Flaxseed, Linseed, (FLAX SEED OIL) 1000 MG CAPS Take 1,000 mg by mouth 3 (three) times daily.    Yes Historical Provider, MD  glipiZIDE (GLUCOTROL) 5 MG tablet Take 10 mg by mouth 2 (two) times daily before a meal.   Yes Historical Provider, MD  Glucosamine-Chondroitin (RA GLUCOSAMINE-CHONDROITIN) 750-600 MG TABS Take 1 tablet by mouth daily.    Yes Historical Provider, MD  losartan (COZAAR) 50 MG tablet Take 25 mg by mouth daily.    Yes Historical Provider, MD  METOPROLOL TARTRATE PO Take 50 mg by mouth 2 (two) times daily.    Yes Historical Provider, MD  Multiple  Vitamin (MULTIVITAMIN PO) Take 1 tablet by mouth daily after breakfast.    Yes Historical Provider, MD  Omega-3 Fatty Acids (FISH OIL) 1200 MG CAPS Take 1 capsule by mouth daily.    Yes Historical Provider, MD  Rivaroxaban (XARELTO) 20 MG TABS Take 20 mg by mouth daily. 07/16/11  Yes Rosalio Macadamia, NP  simvastatin (ZOCOR) 40 MG tablet Take 40 mg by mouth every evening. Take with dinner.   Yes Historical Provider, MD  vitamin C (ASCORBIC ACID) 500 MG tablet Take 500 mg by mouth daily.     Yes Historical Provider, MD    Inpatient Medications:     . antiseptic oral rinse  15 mL Mouth Rinse q12n4p  . chlorhexidine  15 mL Mouth Rinse BID  . citalopram  40 mg Oral Daily  . digoxin  250 mcg Oral Daily  .  HYDROmorphone (DILAUDID) injection  1 mg Intravenous Once  . insulin aspart  0-9 Units Subcutaneous Q4H  . magnesium sulfate 1 - 4 g bolus IVPB  2 g Intravenous Once  . metoprolol  50 mg Oral BID  .  omega-3 acid ethyl esters  1 g Oral Daily  . ondansetron  4 mg Intravenous Once  . simvastatin  40 mg Oral QPM  . sodium chloride  3 mL Intravenous Q12H  . sodium chloride  3 mL Intravenous Q12H  . DISCONTD: sodium chloride   Intravenous STAT  . DISCONTD: Fish Oil  1 capsule Oral Daily  . DISCONTD: insulin aspart  0-9 Units Subcutaneous TID WC  . DISCONTD: magnesium sulfate  2 g Intravenous Once   Prescriptions prior to admission  Medication Sig Dispense Refill  . Calcium Carbonate (CALCIUM 600 PO) Take 1 tablet by mouth daily.       . Cholecalciferol (VITAMIN D PO) Take 2,000 mg by mouth daily.        . citalopram (CELEXA) 40 MG tablet Take 40 mg by mouth daily.      . digoxin (LANOXIN) 0.25 MG tablet Take 1 tablet (250 mcg total) by mouth daily.  90 tablet  3  . Flaxseed, Linseed, (FLAX SEED OIL) 1000 MG CAPS Take 1,000 mg by mouth 3 (three) times daily.       Marland Kitchen glipiZIDE (GLUCOTROL) 5 MG tablet Take 10 mg by mouth 2 (two) times daily before a meal.      . Glucosamine-Chondroitin (RA  GLUCOSAMINE-CHONDROITIN) 750-600 MG TABS Take 1 tablet by mouth daily.       Marland Kitchen losartan (COZAAR) 50 MG tablet Take 25 mg by mouth daily.       Marland Kitchen METOPROLOL TARTRATE PO Take 50 mg by mouth 2 (two) times daily.       . Multiple Vitamin (MULTIVITAMIN PO) Take 1 tablet by mouth daily after breakfast.       . Omega-3 Fatty Acids (FISH OIL) 1200 MG CAPS Take 1 capsule by mouth daily.       . Rivaroxaban (XARELTO) 20 MG TABS Take 20 mg by mouth daily.  90 tablet  3  . simvastatin (ZOCOR) 40 MG tablet Take 40 mg by mouth every evening. Take with dinner.      . vitamin C (ASCORBIC ACID) 500 MG tablet Take 500 mg by mouth daily.          Family History  Problem Relation Age of Onset  . Heart failure Mother     Died 36  . Cancer Father     Died 69     History   Social History  . Marital Status: Married    Spouse Name: N/A    Number of Children: N/A  . Years of Education: N/A   Occupational History  . Not on file.   Social History Main Topics  . Smoking status: Former Smoker -- 2.0 packs/day for 35 years    Types: Cigarettes    Quit date: 04/23/1986  . Smokeless tobacco: Never Used  . Alcohol Use: No  . Drug Use: No  . Sexually Active: Not Currently   Other Topics Concern  . Not on file   Social History Narrative   Widowed in 2012.  Lives alone in [place].  Independent @ home.  Has been struggling with loss of husband.  Dtr near by.  Exercises in pulmonary rehab 2x/wk.     Review of Systems: General: negative for chills, fever, night sweats or weight changes.  Cardiovascular: positive for unchanged DOE, negative for chest pain, edema, orthopnea, palpitations, paroxysmal nocturnal dyspnea or shortness of breath Dermatological: negative for rash Respiratory: negative for cough or wheezing Urologic: negative for hematuria Abdominal: negative for nausea, vomiting,  diarrhea, bright red blood per rectum, melena, or hematemesis Neurologic: negative for visual changes, syncope, or  dizziness All other systems reviewed and are otherwise negative except as noted above.  Physical Exam: Blood pressure 127/74, pulse 63, temperature 98.8 F (37.1 C), temperature source Oral, resp. rate 14, height 5' 5.2" (1.656 m), weight 64.1 kg (141 lb 5 oz), SpO2 98.00%.    General: Elderly, thin, well developed, in no acute distress. Head: Normocephalic, atraumatic, sclera non-icteric, no xanthomas, nares are without discharge.  Neck: Negative for carotid bruits. JVD not elevated. Lungs: Clear bilaterally to auscultation without wheezes, rales, or rhonchi. Breathing is unlabored. Heart: Irregularly irregular, with S1 S2. No murmurs, rubs, or gallops appreciated. Abdomen: Soft, non-tender, non-distended with normoactive bowel sounds. No hepatomegaly. No rebound/guarding. No obvious abdominal masses. Msk:  Strength and tone appears normal for age. Extremities: No clubbing, cyanosis or edema.  Distal pedal pulses are 2+ and equal bilaterally. Neuro: Alert and oriented X 3. Moves all extremities spontaneously. Psych:  Responds to questions appropriately with a normal affect.  Labs: Recent Labs  [facility] 11/08/11 0649 11/07/11 2254   WBC 11.5* 17.2*   HGB 12.1 13.9   HCT 36.6 40.3   MCV 90.1 88.2   PLT 185 231    Lab 11/08/11 0649 11/07/11 2254  NA 141 143  K 4.3 4.1  CL 104 103  CO2 27 24  BUN 18 18  CREATININE 0.76 0.71  CALCIUM 9.1 10.8*  PROT 6.0 --  BILITOT 0.5 --  ALKPHOS 73 --  ALT 79* --  AST 170* --  AMYLASE -- --  LIPASE -- --  GLUCOSE 120* 95   Radiology/Studies: Dg Chest 1 View  11/07/2011  *RADIOLOGY REPORT*  Clinical Data: Left hip pain after fall.  CHEST - 1 VIEW  Comparison: 05/24/2011  Findings: Borderline heart size and pulmonary vascularity.  Mild pulmonary vascular congestion is not excluded.  Interstitial fibrosis and peribronchial thickening suggest chronic bronchitis. Emphysematous changes in the lungs.  No blunting of costophrenic angles.  No focal  consolidation.  No pneumothorax.  Calcification of the aorta.  IMPRESSION: Borderline heart size and pulmonary vascularity may represent early vascular congestion.  No edema.  Emphysema and fibrosis with chronic bronchitic changes.  Original Report Authenticated By: Marlon Pel, M.D.   Dg Hip Complete Left  11/07/2011  *RADIOLOGY REPORT*  Clinical Data: Left hip pain after fall.  LEFT HIP - COMPLETE 2+ VIEW  Comparison: None.  Findings: Acute transverse fracture of the left femoral neck with superior subluxation of the distal fracture fragment.  No dislocation of the acetabular joint.  No focal bone lesion is not demonstrated.  Visualized pelvis, sacrum, and right hip appear intact.  Degenerative changes in the lower lumbar spine and hips.  IMPRESSION: Mostly transverse fracture of the left femoral neck with superior subluxation of distal fracture fragment.  Original Report Authenticated By: Marlon Pel, M.D.    EKG: course atrial fibrillation, 99 bpm, occasional PVCs, no ST-T wave changes  ASSESSMENT:   1. Paroxysmal atrial fibrillation 2. > 2 second pauses 3. Elevated LFTs 4. New hypoalbuminemia 5. Fall with resultant left femoral neck fracture 6. Type 2 DM 7. HTN 8. HL 9. COPD  DISCUSSION/PLAN:   The patient has several cardiac risk factors including HTN, type 2 DM and HL. She has had a normal Myoview last year without evidence of ischemia. She denies chest pain at rest or on exertion, increased dyspnea, LE edema, orthopnea, PND. No signs or  symptoms to suggest heart failure or sequelae of cardiac ischemia. From a functional standpoint, she cannot walk up a flight of stairs without becoming short of breath (< 4 METs). This is difficult to attribute to cardiac limitation as she has COPD with some resultant DOE. From a pre-operative standpoint for an orthopedic surgery, she is at a low-moderate risk.   Regarding her atrial fibrillation, she has known PAF and had been  maintaining NSR for several months on Lopressor and digoxin. Her fall with traumatic injury was likely the eliciting event. Pauses have been documented in the past and this admission (~ 2.7 s on telemetry review). This is consistent with a tachy-brady picture, which has also been noted in the past. PPM would be appropriate, however the patient has declined this procedure before, and is admittedly apprehensive upon discussing today. Will hold digoxin in the meantime. Something to consider will be her anticoagulation and ongoing unsteadiness and frequent falls. I am reluctant to continue Xarelto. It would be catastrophic if she had fallen and hit her head, and suffered an intracranial hemorrhage on anticoagulation. She is at risk for falls in the future. A good option may be to pursue outpatient OT services to assist with proper completion of everyday activities in a mechanically appropriate manner. Will defer further long term recommendations to Dr. Patty Sermons.    Signed, R. Hurman Horn, PA-C 11/08/2011, 2:06 PM  Attending Note:   The patient was seen and examined.  Agree with assessment and plan as noted above.  Pt is very sleepy - much of the history is from the daughters.  She denies any chest pain or dyspnea.  She has frequent episodes of dizziness and has fallen several times.  A stress myoview last year revealed no ischemia and normal LV function. She continues to have pauses on tele.  Exam:  Cor: irreg. irreg  Imp:  1. She is at low-moderate risk for her upcoming hip surgery. 2. Will DC digoxin which will hopefully decrease her frequency of pauses.  She has refused pacer in the past.  I think she would benefit from a pacer and this may resolve her dizzy episodes. 3. We will need to reasses her long term indication for xarelto.  She will need it after her hip surgery .  She has fallen several times and is at risk for head injury.   Vesta Mixer, Montez Hageman., MD, [facility] 11/08/2011, 3:51  PM

## 2011-11-08 NOTE — Progress Notes (Signed)
Patient arrieved back to unit from PACU. Patient is awake, states no pain, able to feel and move toes. Patient has foam dressing to left hip DD&I, hemovac drain to left hip draining blood. Patient still has foley in place. Will continue to monitor patient. Jerrye Bushy

## 2011-11-08 NOTE — Op Note (Signed)
NAME:  Erin Vang                ACCOUNT NO.:  [id number]   MEDICAL RECORD NO.: [id number]   LOCATION:  1435                         FACILITY:  [facility]   DATE OF BIRTH:  1935-05-01  PHYSICIAN:  Madlyn Frankel. Charlann Boxer, M.D.     DATE OF PROCEDURE:  11/08/2011                               OPERATIVE REPORT     PREOPERATIVE DIAGNOSIS:  Left displaced femoral neck fracture.   POSTOPERATIVE DIAGNOSIS:  left displaced femoral neck fracture.   PROCEDURE:  Left hip hemiarthroplasty utilizing DePuy component, size 6 standard Tri-Lock stem with a 48 unipolar ball with a +0 adapter.   SURGEON:  Madlyn Frankel. Charlann Boxer, MD   ASSISTANT:  Lanney Gins, PA-C.   ANESTHESIA:  General.   SPECIMENS:  None.   DRAINS:  One medium Hemovac.   BLOOD LOSS:  About 300 cc.   COMPLICATIONS:  None.   INDICATION OF PROCEDURE:  Mrs. Shumard is a 76 year old female who lives independently.  She unfortunately had a fall at her house bringing in groceries.  She was admitted to the hospital after radiographs revealed a femoral neck fracture.  She was seen and evaluated and was scheduled for surgery for fixation.  The necessity of surgical repair was discussed with she and her family.  Consent was obtained after reviewing risks of infection, DVT, component failure, and need for revision surgery.   PROCEDURE IN DETAIL:  The patient was brought to the operative theater. Once adequate anesthesia, preoperative antibiotics, 900 mg of Clindamycin administered, the patient was positioned into the right lateral decubitus position with the left side up.  The left lower extremity was then prepped and draped in sterile fashion.  A time-out was performed identifying the patient, planned procedure, and extremity.   A lateral incision was made off the proximal trochanter. Sharp dissection was carried down to the iliotibial band and gluteal fascia. The gluteal fascia was then incised for posterior approach.  The  short external rotators were taken down separate from the posterior capsule. An L capsulotomy was made preserving the posterior leaflet for later anatomic repair. Fracture site was identified and after removing comminuted segments of the posterior femoral neck, the femoral head was removed without difficulty and measured on the back table  using the sizing rings and determined to be 48 mm in diameter.   The proximal femur was then exposed.  Retractors placed.  I then drilled, opened the proximal femur.  Then I hand reamed once and  Irrigated the canal to try to prevent fat emboli.  I began broaching the femur with a starter broach up to a size 6 broach with good medial and lateral metaphyseal fit without evidence of any torsion or movement.  A trial reduction was carried out with a standard neck and a +0 adapter with a 48 ball.  The hip reduced nicely.  The leg lengths appeared to be equal compared to the down leg.   The hip went through a range of motion without evidence of any subluxation or impingement.   Given these findings, the trial components removed.  The final 6 standard  Tri-Lock stem was opened.  After irrigating the canal, the  final stem was impacted and sat at the level where the broach was. Based on this and the trial reduction, a +0 adapter was opened and impacted in the 48 unipolar ball onto a clean and dry trunnion.  The hip had been irrigated throughout the case and again at this point.  I re- Approximated the posterior capsule to the superior leaflet using a  #1 Vicryl,  and placed a medium Hemovac drain deep.  The remainder of the wound was closed with #1 Vicryl in the iliotibial band and gluteal fascia, a  2-0 Vicryl in the sub-Q tissue and a running 4-0 Monocryl in the skin.  The hip was cleaned, dried, and dressed sterilely using Dermabond and Aquacel dressing.  Drain site was dressed separately.  She was then brought to recovery room, extubated in stable  condition, tolerating the procedure well.  Lanney Gins, PA-C was present and utilized as Geophysicist/field seismologist for the entire case from  Preoperative positioning to management of the contralateral extremity and retractors to  General facilitation of the procedure.  He was also involved with primary wound closure.         Madlyn Frankel Charlann Boxer, M.D.

## 2011-11-08 NOTE — Progress Notes (Signed)
PATIENT DETAILS Name: Erin Vang Age: 76 y.o. Sex: female Date of Birth: 1935-07-21 Admit Date: 11/07/2011 Admitting Physician Eduard Clos, MD WUJ:[id number], Lenon Curt, MD  Subjective: Admitted with mechanical fall and subsequent left hip fracture, slightly drowsy this am  Assessment/Plan: Principal Problem:  *Closed left hip fracture -spoke with Dr Charlann Boxer, scheduled for OR later this evening  Active Problems: Fall -this was purely mechanical-no syncopal episode  Afib -chronic issue -continue with Digoxin and Metoprolol -on Xarelto as outpatient-per patient last dose 7/16 pm-this will need to be resumed post-op at the discretion of the orthopedic team -last Echo-05/25/11-EF 55%   DIABETES MELLITUS, TYPE II -CBG's stable -continue SSI -resume Glipizide when able   HYPERTENSION -c/w Metoprolol   COPD (chronic obstructive pulmonary disease) -lungs currently clear -as needed albuterol nebs  Dyslipidemia -continue with Statin  Disposition: Remain inpatient  DVT Prophylaxis: Will defer to ortho team post-op  Code Status: Full code   Procedures: -for hip repair later today  CONSULTS:  orthopedic surgery  PHYSICAL EXAM: Vital signs in last 24 hours: Filed Vitals:   11/08/11 0100 11/08/11 0205 11/08/11 0437 11/08/11 1040  BP: 141/78 135/81 123/72 124/74  Pulse: 75 100 88 62  Temp:  98.9 F (37.2 C) 98.6 F (37 C)   TempSrc:  Oral Oral   Resp:  20 14   Height:  5' 5.2" (1.656 m)    Weight:  64 kg (141 lb 1.5 oz) 64.1 kg (141 lb 5 oz)   SpO2: 90% 96% 96%     Weight change:  Body mass index is 23.37 kg/(m^2).   Gen Exam: Awake and alert with clear speech-but slightly drowsy Neck: Supple, No JVD.  Chest: B/L Clear.   CVS: S1 S2 Regular, no murmurs.  Abdomen: soft, BS +, non tender, non distended.  Extremities: no edema, lower extremities warm to touch. Neurologic: Non Focal.   Skin: No Rash.   Wounds: N/A.    Intake/Output from previous  day:  Intake/Output Summary (Last 24 hours) at 11/08/11 1151 Last data filed at 11/08/11 0900  Gross per 24 hour  Intake 151.67 ml  Output      0 ml  Net 151.67 ml     LAB RESULTS: CBC  Lab 11/08/11 0649 11/07/11 2254  WBC 11.5* 17.2*  HGB 12.1 13.9  HCT 36.6 40.3  PLT 185 231  MCV 90.1 88.2  MCH 29.8 30.4  MCHC 33.1 34.5  RDW 12.1 12.0  LYMPHSABS -- --  MONOABS -- --  EOSABS -- --  BASOSABS -- --  BANDABS -- --    Chemistries   Lab 11/08/11 0649 11/07/11 2254  NA 141 143  K 4.3 4.1  CL 104 103  CO2 27 24  GLUCOSE 120* 95  BUN 18 18  CREATININE 0.76 0.71  CALCIUM 9.1 10.8*  MG -- --    CBG:  Lab 11/08/11 0759 11/08/11 0420  GLUCAP 113* 134*    GFR Estimated Creatinine Clearance: 56 ml/min (by C-G formula based on Cr of 0.76).  Coagulation profile No results found for this basename: INR:5,PROTIME:5 in the last 168 hours  Cardiac Enzymes No results found for this basename: CK:3,CKMB:3,TROPONINI:3,MYOGLOBIN:3 in the last 168 hours  No components found with this basename: POCBNP:3 No results found for this basename: DDIMER:2 in the last 72 hours No results found for this basename: HGBA1C:2 in the last 72 hours No results found for this basename: CHOL:2,HDL:2,LDLCALC:2,TRIG:2,CHOLHDL:2,LDLDIRECT:2 in the last 72 hours No results found for this  basename: TSH,T4TOTAL,FREET3,T3FREE,THYROIDAB in the last 72 hours No results found for this basename: VITAMINB12:2,FOLATE:2,FERRITIN:2,TIBC:2,IRON:2,RETICCTPCT:2 in the last 72 hours No results found for this basename: LIPASE:2,AMYLASE:2 in the last 72 hours  Urine Studies No results found for this basename: UACOL:2,UAPR:2,USPG:2,UPH:2,UTP:2,UGL:2,UKET:2,UBIL:2,UHGB:2,UNIT:2,UROB:2,ULEU:2,UEPI:2,UWBC:2,URBC:2,UBAC:2,CAST:2,CRYS:2,UCOM:2,BILUA:2 in the last 72 hours  MICROBIOLOGY: Recent Results (from the past 240 hour(s))  SURGICAL PCR SCREEN     Status: Normal   Collection Time   11/08/11  1:51 AM       Component Value Range Status Comment   MRSA, PCR NEGATIVE  NEGATIVE Final    Staphylococcus aureus NEGATIVE  NEGATIVE Final     RADIOLOGY STUDIES/RESULTS: Dg Chest 1 View  11/07/2011  *RADIOLOGY REPORT*  Clinical Data: Left hip pain after fall.  CHEST - 1 VIEW  Comparison: 05/24/2011  Findings: Borderline heart size and pulmonary vascularity.  Mild pulmonary vascular congestion is not excluded.  Interstitial fibrosis and peribronchial thickening suggest chronic bronchitis. Emphysematous changes in the lungs.  No blunting of costophrenic angles.  No focal consolidation.  No pneumothorax.  Calcification of the aorta.  IMPRESSION: Borderline heart size and pulmonary vascularity may represent early vascular congestion.  No edema.  Emphysema and fibrosis with chronic bronchitic changes.  Original Report Authenticated By: Marlon Pel, M.D.   Dg Hip Complete Left  11/07/2011  *RADIOLOGY REPORT*  Clinical Data: Left hip pain after fall.  LEFT HIP - COMPLETE 2+ VIEW  Comparison: None.  Findings: Acute transverse fracture of the left femoral neck with superior subluxation of the distal fracture fragment.  No dislocation of the acetabular joint.  No focal bone lesion is not demonstrated.  Visualized pelvis, sacrum, and right hip appear intact.  Degenerative changes in the lower lumbar spine and hips.  IMPRESSION: Mostly transverse fracture of the left femoral neck with superior subluxation of distal fracture fragment.  Original Report Authenticated By: Marlon Pel, M.D.    MEDICATIONS: Scheduled Meds:   . antiseptic oral rinse  15 mL Mouth Rinse q12n4p  . chlorhexidine  15 mL Mouth Rinse BID  . citalopram  40 mg Oral Daily  . digoxin  250 mcg Oral Daily  .  HYDROmorphone (DILAUDID) injection  1 mg Intravenous Once  . insulin aspart  0-9 Units Subcutaneous Q4H  . metoprolol  50 mg Oral BID  . omega-3 acid ethyl esters  1 g Oral Daily  . ondansetron  4 mg Intravenous Once  . simvastatin  40  mg Oral QPM  . sodium chloride  3 mL Intravenous Q12H  . sodium chloride  3 mL Intravenous Q12H  . DISCONTD: sodium chloride   Intravenous STAT  . DISCONTD: Fish Oil  1 capsule Oral Daily  . DISCONTD: insulin aspart  0-9 Units Subcutaneous TID WC   Continuous Infusions:   . DISCONTD: sodium chloride 125 mL/hr at 11/07/11 2324   PRN Meds:.acetaminophen, acetaminophen, HYDROmorphone (DILAUDID) injection, ondansetron (ZOFRAN) IV, ondansetron, DISCONTD: fentaNYL  Antibiotics: Anti-infectives    None      Jeoffrey Massed, MD  [facility] Pager:[phone]  If 7PM-7AM, please contact night-coverage [email or web address] Password [email or web address] 11/08/2011, 11:51 AM   LOS: 1 day

## 2011-11-08 NOTE — H&P (Signed)
Erin Vang is an 76 y.o. female.  PCP - Dr.Arthur [name]. Cardiologist - Dr.Brackbill.  Chief Complaint: Fall and left hip pain. HPI: 76 year-old female with history of atrial fibrillation on xarelto, diabetes mellitus2, hyperlipidemia, hypertension and COPD had a fall at home last evening and developed pain in the left hip. Patient states she was walking when suddenly her legs gave way and she fell. Denies hitting her head on the floor or loosening consciousness. Denies any focal deficits. Denies chest pain or shortness of breath. In the ER x-rays revealed left hip fracture and has been admitted for further management.  Past Medical History  Diagnosis Date  . Hypertension   . Diabetes mellitus   . Osteoarthritis   . Hypercholesterolemia   . Paroxysmal atrial fibrillation     CHADS2: 2; manged with rate control and anticoagulation.   . Syncope and collapse     When patient stands up real quickly  . Normal cardiac stress test     a.  lexiscan MV - EF 72%, No ischemia  . Anemia     ? of lab error vs GI bleed  . COPD (chronic obstructive pulmonary disease)   . Weight loss   . Depression   . Prolonged grief reaction   . Chronic anticoagulation     On Xarelto    Past Surgical History  Procedure Date  . Gallbladder surgery   . Appendectomy     age 5  . Cervical spine surgery jan 2011    Family History  Problem Relation Age of Onset  . Heart failure Mother     Died 78  . Cancer Father     Died 32   Social History:  reports that she quit smoking about 25 years ago. Her smoking use included Cigarettes. She has a 70 pack-year smoking history. She has never used smokeless tobacco. She reports that she does not drink alcohol or use illicit drugs.  Allergies:  Allergies  Allergen Reactions  . Erythromycin Hives  . Nitrofurantoin Other (See Comments)    Unknown   . Nitrofurantoin Monohyd Macro Other (See Comments)    Unknown   . Wool Alcohol (Lanolin Alcohol) Hives  .  Neomycin-Bacitracin Zn-Polymyx Rash  . Penicillins Hives and Rash  . Sulfonamide Derivatives Hives and Rash    Medications Prior to Admission  Medication Sig Dispense Refill  . Calcium Carbonate (CALCIUM 600 PO) Take 1 tablet by mouth daily.       . Cholecalciferol (VITAMIN D PO) Take 2,000 mg by mouth daily.        . citalopram (CELEXA) 40 MG tablet Take 40 mg by mouth daily.      . digoxin (LANOXIN) 0.25 MG tablet Take 1 tablet (250 mcg total) by mouth daily.  90 tablet  3  . Flaxseed, Linseed, (FLAX SEED OIL) 1000 MG CAPS Take 1,000 mg by mouth 3 (three) times daily.       Marland Kitchen glipiZIDE (GLUCOTROL) 5 MG tablet Take 10 mg by mouth 2 (two) times daily before a meal.      . Glucosamine-Chondroitin (RA GLUCOSAMINE-CHONDROITIN) 750-600 MG TABS Take 1 tablet by mouth daily.       Marland Kitchen losartan (COZAAR) 50 MG tablet Take 25 mg by mouth daily.       Marland Kitchen METOPROLOL TARTRATE PO Take 50 mg by mouth 2 (two) times daily.       . Multiple Vitamin (MULTIVITAMIN PO) Take 1 tablet by mouth daily after breakfast.       .  Omega-3 Fatty Acids (FISH OIL) 1200 MG CAPS Take 1 capsule by mouth daily.       . Rivaroxaban (XARELTO) 20 MG TABS Take 20 mg by mouth daily.  90 tablet  3  . simvastatin (ZOCOR) 40 MG tablet Take 40 mg by mouth every evening. Take with dinner.      . vitamin C (ASCORBIC ACID) 500 MG tablet Take 500 mg by mouth daily.          Results for orders placed during the hospital encounter of 11/07/11 (from the past 48 hour(s))  DIGOXIN LEVEL     Status: Normal   Collection Time   11/07/11 10:54 PM      Component Value Range Comment   Digoxin Level 1.3  0.8 - 2.0 ng/mL   CBC     Status: Abnormal   Collection Time   11/07/11 10:54 PM      Component Value Range Comment   WBC 17.2 (*) 4.0 - 10.5 K/uL    RBC 4.57  3.87 - 5.11 MIL/uL    Hemoglobin 13.9  12.0 - 15.0 g/dL    HCT 16.1  09.6 - 04.5 %    MCV 88.2  78.0 - 100.0 fL    MCH 30.4  26.0 - 34.0 pg    MCHC 34.5  30.0 - 36.0 g/dL    RDW 40.9   81.1 - 91.4 %    Platelets 231  150 - 400 K/uL   COMPREHENSIVE METABOLIC PANEL     Status: Abnormal   Collection Time   11/07/11 10:54 PM      Component Value Range Comment   Sodium 143  135 - 145 mEq/L    Potassium 4.1  3.5 - 5.1 mEq/L    Chloride 103  96 - 112 mEq/L    CO2 24  19 - 32 mEq/L    Glucose, Bld 95  70 - 99 mg/dL    BUN 18  6 - 23 mg/dL    Creatinine, Ser 7.82  0.50 - 1.10 mg/dL    Calcium 95.6 (*) 8.4 - 10.5 mg/dL    Total Protein 7.2  6.0 - 8.3 g/dL    Albumin 4.0  3.5 - 5.2 g/dL    AST 36  0 - 37 U/L    ALT 25  0 - 35 U/L    Alkaline Phosphatase 75  39 - 117 U/L    Total Bilirubin 0.4  0.3 - 1.2 mg/dL    GFR calc non Af Amer 83 (*) >90 mL/min    GFR calc Af Amer >90  >90 mL/min   TYPE AND SCREEN     Status: Normal   Collection Time   11/07/11 11:15 PM      Component Value Range Comment   ABO/RH(D) A POS      Antibody Screen NEG      Sample Expiration 11/10/2011     SURGICAL PCR SCREEN     Status: Normal   Collection Time   11/08/11  1:51 AM      Component Value Range Comment   MRSA, PCR NEGATIVE  NEGATIVE    Staphylococcus aureus NEGATIVE  NEGATIVE    Dg Chest 1 View  11/07/2011  *RADIOLOGY REPORT*  Clinical Data: Left hip pain after fall.  CHEST - 1 VIEW  Comparison: 05/24/2011  Findings: Borderline heart size and pulmonary vascularity.  Mild pulmonary vascular congestion is not excluded.  Interstitial fibrosis and peribronchial thickening suggest chronic bronchitis. Emphysematous changes in  the lungs.  No blunting of costophrenic angles.  No focal consolidation.  No pneumothorax.  Calcification of the aorta.  IMPRESSION: Borderline heart size and pulmonary vascularity may represent early vascular congestion.  No edema.  Emphysema and fibrosis with chronic bronchitic changes.  Original Report Authenticated By: Marlon Pel, M.D.   Dg Hip Complete Left  11/07/2011  *RADIOLOGY REPORT*  Clinical Data: Left hip pain after fall.  LEFT HIP - COMPLETE 2+ VIEW   Comparison: None.  Findings: Acute transverse fracture of the left femoral neck with superior subluxation of the distal fracture fragment.  No dislocation of the acetabular joint.  No focal bone lesion is not demonstrated.  Visualized pelvis, sacrum, and right hip appear intact.  Degenerative changes in the lower lumbar spine and hips.  IMPRESSION: Mostly transverse fracture of the left femoral neck with superior subluxation of distal fracture fragment.  Original Report Authenticated By: Marlon Pel, M.D.    Review of Systems  Constitutional: Negative.   HENT: Negative.   Eyes: Negative.   Respiratory: Negative.   Cardiovascular: Negative.   Gastrointestinal: Negative.   Genitourinary: Negative.   Musculoskeletal: Positive for joint pain (Left hip pain.) and falls.  Skin: Negative.   Neurological: Negative.   Endo/Heme/Allergies: Negative.   Psychiatric/Behavioral: Negative.     Blood pressure 135/81, pulse 100, temperature 98.9 F (37.2 C), temperature source Oral, resp. rate 20, height 5' 5.2" (1.656 m), weight 64 kg (141 lb 1.5 oz), SpO2 96.00%. Physical Exam  Constitutional: She appears well-developed and well-nourished. No distress.  HENT:  Head: Normocephalic and atraumatic.  Right Ear: External ear normal.  Left Ear: External ear normal.  Nose: Nose normal.  Mouth/Throat: Oropharynx is clear and moist. No oropharyngeal exudate.  Eyes: Conjunctivae are normal. Pupils are equal, round, and reactive to light. Right eye exhibits no discharge. Left eye exhibits no discharge. No scleral icterus.  Neck: Normal range of motion. Neck supple.  Cardiovascular: Normal rate and regular rhythm.   Respiratory: Effort normal and breath sounds normal. No respiratory distress. She has no wheezes. She has no rales.  GI: Soft. Bowel sounds are normal. She exhibits no distension. There is no tenderness. There is no rebound.  Musculoskeletal: She exhibits no edema.       Pain on moving left  hip.  Skin: Skin is warm and dry. She is not diaphoretic.  Psychiatric: Her behavior is normal.     Assessment/Plan #1. Left hip fracture - patient will be seen by orthopedic surgeon Dr.Ollin. Patient is on xarelto but has not taken it last evening. She usually takes it in the evening and last dose has been more than 24 hours. I have discussed with pharmacy and they have recommended to be off xarelto for at least 24 hours before surgery. At this time from medical standpoint of view patient is denying any chest pain or shortness of breath and will benefit from surgery if planned by orthopedics. #2. Atrial flutter fibrillation - presently rate controlled. Off xarelto for now for possible surgery. Continue beta blockers. #3. Diabetes mellitus2 - since patient is n.p.o. for possible surgery we will keep her off the Glucotrol and on sliding-scale. #4. History of hyperlipidemia - continue statins. #5. History of hypertension - restart ARB after the surgery. #6. History of COPD presently not wheezing.  CODE STATUS - full code.  Erin Vang N. 11/08/2011, 4:03 AM

## 2011-11-08 NOTE — Care Management Note (Signed)
    Page 1 of 1   11/12/2011     10:17:33 AM   CARE MANAGEMENT NOTE 11/12/2011  Patient:  Erin Vang, Erin Vang   Account Number:  [id number]  Date Initiated:  11/08/2011  Documentation initiated by:  Letha Cape  Subjective/Objective Assessment:   dx left hip fx  admit- lives alone.     Action/Plan:   7/18 hip surgery  pt/ot eval   Anticipated DC Date:  11/12/2011   Anticipated DC Plan:  SKILLED NURSING FACILITY  In-house referral  Clinical Social Worker      DC Planning Services  CM consult      Choice offered to / List presented to:             Status of service:  Completed, signed off Medicare Important Message given?   (If response is "NO", the following Medicare IM given date fields will be blank) Date Medicare IM given:   Date Additional Medicare IM given:    Discharge Disposition:  SKILLED NURSING FACILITY  Per UR Regulation:  Reviewed for med. necessity/level of care/duration of stay  If discussed at Long Length of Stay Meetings, dates discussed:    Comments:  11/12/11 10:16 Letha Cape RN, BSN [phone] patient for dc today to snf.  11/08/11 16:18 Letha Cape RN, BSN [phone] patient has left hip fx, for surgery today, most likely will need snf, CSW referral.

## 2011-11-08 NOTE — Transfer of Care (Signed)
Immediate Anesthesia Transfer of Care Note  Patient: Erin Vang  Procedure(s) Performed: Procedure(s) (LRB): ARTHROPLASTY BIPOLAR HIP (Left)  Patient Location: PACU  Anesthesia Type: General  Level of Consciousness: awake, alert , oriented and patient cooperative  Airway & Oxygen Therapy: Patient Spontanous Breathing and Patient connected to nasal cannula oxygen  Post-op Assessment: Report given to PACU RN, Post -op Vital signs reviewed and stable and Patient moving all extremities X 4  Post vital signs: Reviewed and stable  Complications: No apparent anesthesia complications

## 2011-11-08 NOTE — Preoperative (Signed)
Beta Blockers   Reason not to administer Beta Blockers:Pt took beta blocker at 1041 this morning 11/08/2011

## 2011-11-08 NOTE — Anesthesia Preprocedure Evaluation (Addendum)
Anesthesia Evaluation  Patient identified by MRN, date of birth, ID band Patient awake    Reviewed: Allergy & Precautions, H&P , NPO status , Patient's Chart, lab work & pertinent test results, reviewed documented beta blocker date and time   Airway Mallampati: II TM Distance: >3 FB Neck ROM: Full    Dental No notable dental hx. (+) Edentulous Upper and Dental Advisory Given   Pulmonary shortness of breath, COPDformer smoker,  breath sounds clear to auscultation  Pulmonary exam normal       Cardiovascular hypertension, On Home Beta Blockers, Pt. on medications and Pt. on home beta blockers + dysrhythmias Atrial Fibrillation Rhythm:Irregular Rate:Normal     Neuro/Psych PSYCHIATRIC DISORDERS Anxiety Depression negative neurological ROS     GI/Hepatic negative GI ROS, Neg liver ROS,   Endo/Other  Type 2, Oral Hypoglycemic Agents  Renal/GU negative Renal ROS  negative genitourinary   Musculoskeletal   Abdominal   Peds  Hematology negative hematology ROS (+)   Anesthesia Other Findings   Reproductive/Obstetrics negative OB ROS                          Anesthesia Physical Anesthesia Plan  ASA: III  Anesthesia Plan: General   Post-op Pain Management:    Induction: Intravenous  Airway Management Planned: Oral ETT  Additional Equipment:   Intra-op Plan:   Post-operative Plan: Extubation in OR  Informed Consent: I have reviewed the patients History and Physical, chart, labs and discussed the procedure including the risks, benefits and alternatives for the proposed anesthesia with the patient or authorized representative who has indicated his/her understanding and acceptance.   Dental advisory given  Plan Discussed with: CRNA, Anesthesiologist and Surgeon  Anesthesia Plan Comments:        Anesthesia Quick Evaluation

## 2011-11-08 NOTE — Progress Notes (Signed)
Notified Toniann Fail, MD that patient had pause of 1.76 seconds. Patient running A-fib. Patient is asymptomatic. No new orders given. Will continue to monitor patient. Jerrye Bushy

## 2011-11-08 NOTE — Progress Notes (Signed)
Patient admitted from ED with left hip fracture.  Patient fell up 2 steps when she was taking groceries into the house. Patient's left leg is shortened.  Patient lives at home alone. Patient has slight redness to buttocks.  Patient has a 14 french foley cath to gravity.  Patient is A&Ox3. Patient refuses to watch safety video, explained to patient not to get up without assistance, to call for assistance and placed on bed alarm.  Oriented to patient to room and unit.  Will continue to monitor patient. Jerrye Bushy

## 2011-11-08 NOTE — Consult Note (Signed)
Reason for Consult:  Left hip pain s/p fall Refered Physician: Dr. Maryjean Morn is an 76 y.o. female.  HPI: Patient is a 77 y.o.female who while at home fell while trying to go down 2 stairs. She instantly had pain the left hip and was unable to move the hip. EMS was called and transported the patient to the ER. X-rays revealed left femoral neck fracture. Dr. Charlann Boxer was consulted. After examining the patient various options are discussed. Risks, benefits and expectations were discussed with the patient and family. Patient and family understand the risks, benefits and expectations and wishes to proceed with surgery.   Past Medical History  Diagnosis Date  . Hypertension   . Diabetes mellitus   . Osteoarthritis   . Hypercholesterolemia   . Paroxysmal atrial fibrillation     CHADS2: 2; manged with rate control and anticoagulation.   . Syncope and collapse     When patient stands up real quickly  . Normal cardiac stress test     a.  lexiscan MV - EF 72%, No ischemia  . Anemia     ? of lab error vs GI bleed  . COPD (chronic obstructive pulmonary disease)   . Weight loss   . Depression   . Prolonged grief reaction   . Chronic anticoagulation     On Xarelto    Past Surgical History  Procedure Date  . Gallbladder surgery   . Appendectomy     age 89  . Cervical spine surgery jan 2011    Family History  Problem Relation Age of Onset  . Heart failure Mother     Died 39  . Cancer Father     Died 51    Social History:  reports that she quit smoking about 25 years ago. Her smoking use included Cigarettes. She has a 70 pack-year smoking history. She has never used smokeless tobacco. She reports that she does not drink alcohol or use illicit drugs.  Allergies:  Allergies  Allergen Reactions  . Erythromycin Hives  . Nitrofurantoin Other (See Comments)    Unknown   . Nitrofurantoin Monohyd Macro Other (See Comments)    Unknown   . Wool Alcohol (Lanolin Alcohol) Hives  .  Neomycin-Bacitracin Zn-Polymyx Rash  . Penicillins Hives and Rash  . Sulfonamide Derivatives Hives and Rash    Medications: I have reviewed the patient's current medications.  Results for orders placed during the hospital encounter of 11/07/11 (from the past 48 hour(s))  DIGOXIN LEVEL     Status: Normal   Collection Time   11/07/11 10:54 PM      Component Value Range Comment   Digoxin Level 1.3  0.8 - 2.0 ng/mL   CBC     Status: Abnormal   Collection Time   11/07/11 10:54 PM      Component Value Range Comment   WBC 17.2 (*) 4.0 - 10.5 K/uL    RBC 4.57  3.87 - 5.11 MIL/uL    Hemoglobin 13.9  12.0 - 15.0 g/dL    HCT 16.1  09.6 - 04.5 %    MCV 88.2  78.0 - 100.0 fL    MCH 30.4  26.0 - 34.0 pg    MCHC 34.5  30.0 - 36.0 g/dL    RDW 40.9  81.1 - 91.4 %    Platelets 231  150 - 400 K/uL   COMPREHENSIVE METABOLIC PANEL     Status: Abnormal   Collection Time   11/07/11 10:54  PM      Component Value Range Comment   Sodium 143  135 - 145 mEq/L    Potassium 4.1  3.5 - 5.1 mEq/L    Chloride 103  96 - 112 mEq/L    CO2 24  19 - 32 mEq/L    Glucose, Bld 95  70 - 99 mg/dL    BUN 18  6 - 23 mg/dL    Creatinine, Ser 1.61  0.50 - 1.10 mg/dL    Calcium 09.6 (*) 8.4 - 10.5 mg/dL    Total Protein 7.2  6.0 - 8.3 g/dL    Albumin 4.0  3.5 - 5.2 g/dL    AST 36  0 - 37 U/L    ALT 25  0 - 35 U/L    Alkaline Phosphatase 75  39 - 117 U/L    Total Bilirubin 0.4  0.3 - 1.2 mg/dL    GFR calc non Af Amer 83 (*) >90 mL/min    GFR calc Af Amer >90  >90 mL/min   TYPE AND SCREEN     Status: Normal   Collection Time   11/07/11 11:15 PM      Component Value Range Comment   ABO/RH(D) A POS      Antibody Screen NEG      Sample Expiration 11/10/2011     SURGICAL PCR SCREEN     Status: Normal   Collection Time   11/08/11  1:51 AM      Component Value Range Comment   MRSA, PCR NEGATIVE  NEGATIVE    Staphylococcus aureus NEGATIVE  NEGATIVE   GLUCOSE, CAPILLARY     Status: Abnormal   Collection Time   11/08/11   4:20 AM      Component Value Range Comment   Glucose-Capillary 134 (*) 70 - 99 mg/dL    Comment 1 Documented in Chart      Comment 2 Notify RN     COMPREHENSIVE METABOLIC PANEL     Status: Abnormal   Collection Time   11/08/11  6:49 AM      Component Value Range Comment   Sodium 141  135 - 145 mEq/L    Potassium 4.3  3.5 - 5.1 mEq/L    Chloride 104  96 - 112 mEq/L    CO2 27  19 - 32 mEq/L    Glucose, Bld 120 (*) 70 - 99 mg/dL    BUN 18  6 - 23 mg/dL    Creatinine, Ser 0.45  0.50 - 1.10 mg/dL    Calcium 9.1  8.4 - 40.9 mg/dL    Total Protein 6.0  6.0 - 8.3 g/dL    Albumin 3.2 (*) 3.5 - 5.2 g/dL    AST 811 (*) 0 - 37 U/L    ALT 79 (*) 0 - 35 U/L    Alkaline Phosphatase 73  39 - 117 U/L    Total Bilirubin 0.5  0.3 - 1.2 mg/dL    GFR calc non Af Amer 81 (*) >90 mL/min    GFR calc Af Amer >90  >90 mL/min   CBC     Status: Abnormal   Collection Time   11/08/11  6:49 AM      Component Value Range Comment   WBC 11.5 (*) 4.0 - 10.5 K/uL    RBC 4.06  3.87 - 5.11 MIL/uL    Hemoglobin 12.1  12.0 - 15.0 g/dL    HCT 91.4  78.2 - 95.6 %    MCV  90.1  78.0 - 100.0 fL    MCH 29.8  26.0 - 34.0 pg    MCHC 33.1  30.0 - 36.0 g/dL    RDW 16.1  09.6 - 04.5 %    Platelets 185  150 - 400 K/uL   DIGOXIN LEVEL     Status: Normal   Collection Time   11/08/11  6:49 AM      Component Value Range Comment   Digoxin Level 1.6  0.8 - 2.0 ng/mL   MAGNESIUM     Status: Abnormal   Collection Time   11/08/11  6:49 AM      Component Value Range Comment   Magnesium 1.4 (*) 1.5 - 2.5 mg/dL   GLUCOSE, CAPILLARY     Status: Abnormal   Collection Time   11/08/11  7:59 AM      Component Value Range Comment   Glucose-Capillary 113 (*) 70 - 99 mg/dL    Comment 1 Documented in Chart      Comment 2 Notify RN     GLUCOSE, CAPILLARY     Status: Normal   Collection Time   11/08/11 12:17 PM      Component Value Range Comment   Glucose-Capillary 88  70 - 99 mg/dL    Comment 1 Documented in Chart      Comment 2  Notify RN       Dg Chest 1 View  11/07/2011  *RADIOLOGY REPORT*  Clinical Data: Left hip pain after fall.  CHEST - 1 VIEW  Comparison: 05/24/2011  Findings: Borderline heart size and pulmonary vascularity.  Mild pulmonary vascular congestion is not excluded.  Interstitial fibrosis and peribronchial thickening suggest chronic bronchitis. Emphysematous changes in the lungs.  No blunting of costophrenic angles.  No focal consolidation.  No pneumothorax.  Calcification of the aorta.  IMPRESSION: Borderline heart size and pulmonary vascularity may represent early vascular congestion.  No edema.  Emphysema and fibrosis with chronic bronchitic changes.  Original Report Authenticated By: Marlon Pel, M.D.   Dg Hip Complete Left  11/07/2011  *RADIOLOGY REPORT*  Clinical Data: Left hip pain after fall.  LEFT HIP - COMPLETE 2+ VIEW  Comparison: None.  Findings: Acute transverse fracture of the left femoral neck with superior subluxation of the distal fracture fragment.  No dislocation of the acetabular joint.  No focal bone lesion is not demonstrated.  Visualized pelvis, sacrum, and right hip appear intact.  Degenerative changes in the lower lumbar spine and hips.  IMPRESSION: Mostly transverse fracture of the left femoral neck with superior subluxation of distal fracture fragment.  Original Report Authenticated By: Marlon Pel, M.D.    Review of Systems  Constitutional: Negative.   HENT: Negative.   Eyes: Negative.   Respiratory: Negative.   Cardiovascular: Negative.   Gastrointestinal: Negative.   Genitourinary: Negative.   Musculoskeletal: Positive for joint pain (left hip).  Skin: Negative.   Neurological: Negative.   Endo/Heme/Allergies: Negative.   Psychiatric/Behavioral: Negative.    Blood pressure 127/74, pulse 63, temperature 98.8 F (37.1 C), temperature source Oral, resp. rate 14, height 5' 5.2" (1.656 m), weight 141 lb 5 oz (64.1 kg), SpO2 98.00%. Physical Exam    Constitutional: She is oriented to person, place, and time. She appears well-developed and well-nourished.  HENT:  Head: Normocephalic and atraumatic.  Nose: Nose normal.  Mouth/Throat: Oropharynx is clear and moist.  Eyes: Pupils are equal, round, and reactive to light.  Neck: Neck supple. No JVD present. No tracheal deviation  present. No thyromegaly present.  Cardiovascular: Normal rate, regular rhythm and intact distal pulses.   Respiratory: Effort normal and breath sounds normal. No respiratory distress. She has no wheezes. She exhibits no tenderness.  GI: Soft. There is no tenderness. There is no guarding.  Musculoskeletal:       Left hip: She exhibits decreased range of motion, decreased strength, tenderness, bony tenderness and swelling. She exhibits no deformity and no laceration.  Lymphadenopathy:    She has no cervical adenopathy.  Neurological: She is alert and oriented to person, place, and time.  Skin: Skin is warm and dry.  Psychiatric: She has a normal mood and affect.    Assessment/Plan: Left femoral neck fracture  NPO now Scheduled for surgery later today Risks, benefits and expectations were discussed with the patient and patient's family. Patient and patient family understand the risks, benefits and expectations and wishes to proceed with left hip hemi arthroplasty.   Gerrit Halls 11/08/2011, 1:52 PM

## 2011-11-08 NOTE — Consult Note (Signed)
Agree with Babish's assessment  To OR tonight for left hip hemiarthroplasty NPO Consent signed

## 2011-11-08 NOTE — Progress Notes (Signed)
5 beat run of V-Tach. Dr Jerral Ralph aware. No new orders at this time. Awaiting ortho for hip fracture. Madelin Rear RN, CMSRN

## 2011-11-08 NOTE — Anesthesia Procedure Notes (Signed)
Procedure Name: Intubation Date/Time: 11/08/2011 8:52 PM Performed by: Rogelia Boga Pre-anesthesia Checklist: Patient identified, Emergency Drugs available, Suction available, Patient being monitored and Timeout performed Patient Re-evaluated:Patient Re-evaluated prior to inductionOxygen Delivery Method: Circle system utilized Preoxygenation: Pre-oxygenation with 100% oxygen Intubation Type: IV induction Ventilation: Mask ventilation without difficulty and Oral airway inserted - appropriate to patient size Laryngoscope Size: Mac and 4 Grade View: Grade I Tube type: Oral Tube size: 7.5 mm Number of attempts: 1 Airway Equipment and Method: Stylet Placement Confirmation: ETT inserted through vocal cords under direct vision,  positive ETCO2 and breath sounds checked- equal and bilateral Secured at: 20 cm Tube secured with: Tape Dental Injury: Teeth and Oropharynx as per pre-operative assessment

## 2011-11-08 NOTE — ED Notes (Signed)
Daughter's numbers: Cell- [phone] Home- [phone]

## 2011-11-08 NOTE — Progress Notes (Signed)
Patient having bradycardic event HR in 30's VS taking, Patient sleeping without any distress per the daughhter who is at the bedside. Notified MD.

## 2011-11-09 DIAGNOSIS — I498 Other specified cardiac arrhythmias: Secondary | ICD-10-CM

## 2011-11-09 DIAGNOSIS — I119 Hypertensive heart disease without heart failure: Secondary | ICD-10-CM

## 2011-11-09 LAB — CBC
MCV: 90.6 fL (ref 78.0–100.0)
Platelets: 175 10*3/uL (ref 150–400)
RBC: 3.73 MIL/uL — ABNORMAL LOW (ref 3.87–5.11)
RDW: 12.2 % (ref 11.5–15.5)
WBC: 14.6 10*3/uL — ABNORMAL HIGH (ref 4.0–10.5)

## 2011-11-09 LAB — GLUCOSE, CAPILLARY
Glucose-Capillary: 138 mg/dL — ABNORMAL HIGH (ref 70–99)
Glucose-Capillary: 142 mg/dL — ABNORMAL HIGH (ref 70–99)
Glucose-Capillary: 192 mg/dL — ABNORMAL HIGH (ref 70–99)
Glucose-Capillary: 215 mg/dL — ABNORMAL HIGH (ref 70–99)

## 2011-11-09 LAB — BASIC METABOLIC PANEL
CO2: 23 mEq/L (ref 19–32)
Chloride: 102 mEq/L (ref 96–112)
Creatinine, Ser: 0.7 mg/dL (ref 0.50–1.10)
GFR calc Af Amer: >90 mL/min (ref >90)
Sodium: 136 mEq/L (ref 135–145)

## 2011-11-09 MED ORDER — RIVAROXABAN 20 MG PO TABS
20.0000 mg | ORAL_TABLET | ORAL | Status: DC
  Administered 2011-11-09 – 2011-11-10 (×2): 20 mg via ORAL
  Filled 2011-11-09 (×4): qty 1

## 2011-11-09 NOTE — Clinical Social Work Psychosocial (Signed)
     Clinical Social Work Department BRIEF PSYCHOSOCIAL ASSESSMENT 11/09/2011  Patient:  Erin Vang, Erin Vang     Account Number:  [id number]     Admit date:  11/07/2011  Clinical Social Worker:  Jacelyn Grip  Date/Time:  11/09/2011 09:45 AM  Referred by:  Physician  Date Referred:  11/09/2011 Referred for  SNF Placement   Other Referral:   Interview type:  Patient Other interview type:    PSYCHOSOCIAL DATA Living Status:  ALONE Admitted from facility:   Level of care:   Primary support name:  Fayrene Fearing [place]/[phone] Primary support relationship to patient:  CHILD, ADULT Degree of support available:   adequate    CURRENT CONCERNS Current Concerns  Post-Acute Placement   Other Concerns:    SOCIAL WORK ASSESSMENT / PLAN CSW received referral for New SNF. CSW met with pt at bedside. CSW discussed recommendation for SNF. Pt agreeable to SNF search in [facility] Pt interested in [facility] as pt daughter lives in [place]. CSW completed FL2 and initiated SNF search to [facility]. [facility] submitted. CSW contacted [facility] to notify of pt interest in facility. CSW to follow up with pt in regard to bed offers. CSW to facilitate pt discharge needs when pt medically stable for discharge.   Assessment/plan status:  Psychosocial Support/Ongoing Assessment of Needs Other assessment/ plan:   discharge planning   Information/referral to community resources:   [facility] list    PATIENTS/FAMILYS RESPONSE TO PLAN OF CARE: Pt alert and oriented and complaining of pain. Pt reports strong support from pt daughter and hopeful to be placed close to pt daughters home. Pt agreeable to [facility] SN[facility].

## 2011-11-09 NOTE — Progress Notes (Signed)
Subjective: 1 Day Post-Op Procedure(s) (LRB): ARTHROPLASTY BIPOLAR HIP (Left)    Patient reports pain as moderate. Otherwise no events  Objective:   VITALS:   Filed Vitals:   11/09/11 0422  BP: 124/66  Pulse: 110  Temp: 99 F (37.2 C)  Resp: 20    Neurologically intact Incision: dressing C/D/I HV drained removed  LABS  Basename 11/09/11 0613 11/08/11 0649 11/07/11 2254  HGB 11.0* 12.1 13.9  HCT 33.8* 36.6 40.3  WBC 14.6* 11.5* 17.2*  PLT 175 185 231     Basename 11/09/11 0613 11/08/11 0649 11/07/11 2254  NA 136 141 143  K 4.1 4.3 4.1  BUN 14 18 18   CREATININE 0.70 0.76 0.71  GLUCOSE 198* 120* 95    No results found for this basename: LABPT:2,INR:2 in the last 72 hours   Assessment/Plan: 1 Day Post-Op Procedure(s) (LRB): ARTHROPLASTY BIPOLAR HIP (Left)   Advance diet Up with therapy Discharge to SNF when stable, wants to go to [facility] as family lives in that area Lovenox for 2 weeks WBAT LLE Daily dressing changes as needed after 7/21

## 2011-11-09 NOTE — Progress Notes (Signed)
PROGRESS NOTE  Subjective:   Pt is having some hip pain.  HR is a bit fast but otherwise she is doing well.    Objective:    Vital Signs:   Temp:  [96.8 F (36 C)-99.7 F (37.6 C)] 99 F (37.2 C) (07/19 0422) Pulse Rate:  [62-110] 110  (07/19 0422) Resp:  [14-24] 20  (07/19 0422) BP: (124-158)/(51-94) 124/66 mmHg (07/19 0422) SpO2:  [91 %-99 %] 96 % (07/19 0422)  Last BM Date: 11/07/11   24-hour weight change: Weight change:   Weight trends: Filed Weights   11/08/11 0205 11/08/11 0437  Weight: 141 lb 1.5 oz (64 kg) 141 lb 5 oz (64.1 kg)    Intake/Output:  07/18 0701 - 07/19 0700 In: 1830.8 [I.V.:1780.8; IV Piggyback:50] Out: 1110 [Urine:695; Drains:65; Blood:350]     Physical Exam: BP 124/66  Pulse 110  Temp 99 F (37.2 C) (Oral)  Resp 20  Ht 5' 5.2" (1.656 m)  Wt 141 lb 5 oz (64.1 kg)  BMI 23.37 kg/m2  SpO2 96%  General: Vital signs reviewed and noted. Well-developed, well-nourished, in no acute distress; alert, appropriate and cooperative .  Head: Normocephalic, atraumatic.  Eyes: conjunctivae/corneas clear.  EOM's intact.   Throat: normal  Neck: Supple. Normal carotids. No JVD  Lungs:  Clear to auscultation  Heart: Irregularly irregular  Abdomen:  Soft, non-tender, non-distended with normoactive bowel sounds. No hepatomegaly. No rebound/guarding. No abdominal masses.  Extremities: S/p left hip surgery  Neurologic: A&O X3, CN II - XII are grossly intact. Motor strength is 5/5 in the all 4 extremities.  Psych: Responds to questions appropriately with normal affect.    Labs: BMET:  Basename 11/09/11 0613 11/08/11 0649  NA 136 141  K 4.1 4.3  CL 102 104  CO2 23 27  GLUCOSE 198* 120*  BUN 14 18  CREATININE 0.70 0.76  CALCIUM 8.1* 9.1  MG -- 1.4*  PHOS -- --    Liver function tests:  [facility] 11/08/11 0649 11/07/11 2254  AST 170* 36  ALT 79* 25  ALKPHOS 73 75  BILITOT 0.5 0.4  PROT 6.0 7.2  ALBUMIN 3.2* 4.0   No results found for  this basename: LIPASE:2,AMYLASE:2 in the last 72 hours  CBC:  Basename 11/09/11 0613 11/08/11 0649  WBC 14.6* 11.5*  NEUTROABS -- --  HGB 11.0* 12.1  HCT 33.8* 36.6  MCV 90.6 90.1  PLT 175 185    Cardiac Enzymes: No results found for this basename: CKTOTAL:4,CKMB:4,TROPONINI:4 in the last 72 hours  Coagulation Studies: No results found for this basename: LABPROT:5,INR:5 in the last 72 hours     Tele: A-fib - rate is upper limits of normal.  Medications:    Infusions:    . sodium chloride 0.9 % 1,000 mL with potassium chloride 10 mEq infusion 100 mL/hr at 11/09/11 0040  . DISCONTD: sodium chloride 75 mL/hr at 11/08/11 1230  . DISCONTD: dextrose 5 % and 0.9% NaCl 100 mL/hr at 11/08/11 1828    Scheduled Medications:    . antiseptic oral rinse  15 mL Mouth Rinse q12n4p  . chlorhexidine  15 mL Mouth Rinse BID  . citalopram  40 mg Oral Daily  . clindamycin (CLEOCIN) IV  600 mg Intravenous Q6H  . clindamycin (CLEOCIN) IV  900 mg Intravenous 60 min Pre-Op  . dextrose      . docusate sodium  100 mg Oral BID  . enoxaparin (LOVENOX) injection  40 mg Subcutaneous Q24H  . ferrous sulfate  325 mg Oral TID PC  . insulin aspart  0-15 Units Subcutaneous TID WC  . magnesium sulfate 1 - 4 g bolus IVPB  2 g Intravenous Once  . metoprolol  50 mg Oral BID  . omega-3 acid ethyl esters  1 g Oral Daily  . polyethylene glycol  17 g Oral BID  . simvastatin  40 mg Oral QPM  . DISCONTD: digoxin  250 mcg Oral Daily  . DISCONTD: insulin aspart  0-9 Units Subcutaneous Q4H  . DISCONTD: magnesium sulfate  2 g Intravenous Once  . DISCONTD: sodium chloride  3 mL Intravenous Q12H  . DISCONTD: sodium chloride  3 mL Intravenous Q12H    Assessment/ Plan:   Paroxysmal atrial fibrillation () Rate is ok today.  Faster than yesterday due to hip pain and absence of Digoxin.  Continue current meds.  Closed left hip fracture (11/08/2011) S/p surgical repair.      Disposition:  Length of Stay:  2  Vesta Mixer, Montez Hageman., MD, [facility] 11/09/2011, 8:15 AM Office [phone] Pager [phone]

## 2011-11-09 NOTE — Progress Notes (Signed)
Clinical Social Worker received phone call from pt daughter and pt provided permission for this Clinical Social Worker to discuss discharge planning with pt daughter. Clinical Social Worker discussed bed offers and pt and pt family choose bed at [facility]. Clinical Social Worker notified facility of acceptance of bed. Clinical Social Worker discussed with MD and anticipate discharge for Monday. Clinical Social Worker to facilitate pt discharge needs when pt medically stable for discharge.  Jacklynn Lewis, MSW, LCSWA  Clinical Social Work [phone]

## 2011-11-09 NOTE — Progress Notes (Addendum)
Clinical Social Work Department CLINICAL SOCIAL WORK PLACEMENT NOTE 11/09/2011  Patient:  Erin Vang, Erin Vang  Account Number:  [id number] Admit date:  11/07/2011  Clinical Social Worker:  Jacelyn Grip  Date/time:  11/09/2011 12:00 N  Clinical Social Work is seeking post-discharge placement for this patient at the following level of care:   SKILLED NURSING   (*CSW will update this form in Epic as items are completed)   11/09/2011  Patient/family provided with [facility] Department of Clinical Social Work's list of facilities offering this level of care within the geographic area requested by the patient (or if unable, by the patient's family).  11/09/2011  Patient/family informed of their freedom to choose among providers that offer the needed level of care, that participate in Medicare, Medicaid or managed care program needed by the patient, have an available bed and are willing to accept the patient.  11/09/2011  Patient/family informed of [facility]' ownership interest in [facility], as well as of the fact that they are under no obligation to receive care at this facility.  PASARR submitted to EDS on 11/09/2011 PASARR number received from EDS on   FL2 transmitted to all facilities in geographic area requested by pt/family on  11/09/2011 FL2 transmitted to all facilities within larger geographic area on   Patient informed that his/her managed care company has contracts with or will negotiate with  certain facilities, including the following:     Patient/family informed of bed offers received:  11/09/2011  Patient chooses bed at [facility] Physician recommends and patient chooses bed at    Patient to be transferred to  on   Patient to be transferred to facility by   The following physician request were entered in Epic:   Additional Comments:   Jacklynn Lewis, MSW, LCSWA  Clinical Social Work [phone]

## 2011-11-09 NOTE — Progress Notes (Signed)
PATIENT DETAILS Name: Erin Vang Age: 76 y.o. Sex: female Date of Birth: Aug 20, 1935 Admit Date: 11/07/2011 Admitting Physician Eduard Clos, MD [id number], Lenon Curt, MD  Subjective: S/P left hip repair  Assessment/Plan: Principal Problem:  *Closed left hip fracture -POD # 1 -d/c foley -spoke with Dr [place] Sink to transition from Lovenox to Xarelto -await PT  Active Problems: Fall -this was purely mechanical-no syncopal episode  Afib -chronic issue -continue with  Metoprolol -Digoxin now on hold -restart Xarelto today -last Echo-05/25/11-EF 55%  Sinus Pauses -per cards -off Digoxin   DIABETES MELLITUS, TYPE II -CBG's stable -continue SSI -resume Glipizide when able   HYPERTENSION -c/w Metoprolol   COPD (chronic obstructive pulmonary disease) -lungs currently clear -as needed albuterol nebs  Dyslipidemia -continue with Statin  Disposition: Remain inpatient-remain SNF  DVT Prophylaxis: Starting Xarelto  Code Status: Full code   Procedures: -for hip repair 7/18  CONSULTS:  orthopedic surgery  PHYSICAL EXAM: Vital signs in last 24 hours: Filed Vitals:   11/08/11 2331 11/09/11 0200 11/09/11 0422 11/09/11 0903  BP: 127/72 129/74 124/66 144/72  Pulse: 82 98 110 121  Temp: 99.1 F (37.3 C) 99.7 F (37.6 C) 99 F (37.2 C)   TempSrc: Oral Oral Oral   Resp: 16 16 20    Height:      Weight:      SpO2: 91% 99% 96%     Weight change:  Body mass index is 23.37 kg/(m^2).   Gen Exam: Awake and alert with clear speech-but slightly drowsy Neck: Supple, No JVD.  Chest: B/L Clear.   CVS: S1 S2 Regular, no murmurs.  Abdomen: soft, BS +, non tender, non distended.  Extremities: no edema, lower extremities warm to touch. Neurologic: Non Focal.   Skin: No Rash.   Wounds: N/A.    Intake/Output from previous day:  Intake/Output Summary (Last 24 hours) at 11/09/11 1224 Last data filed at 11/09/11 0644  Gross per 24 hour  Intake 1830.83 ml    Output   1110 ml  Net 720.83 ml     LAB RESULTS: CBC  Lab 11/09/11 0613 11/08/11 0649 11/07/11 2254  WBC 14.6* 11.5* 17.2*  HGB 11.0* 12.1 13.9  HCT 33.8* 36.6 40.3  PLT 175 185 231  MCV 90.6 90.1 88.2  MCH 29.5 29.8 30.4  MCHC 32.5 33.1 34.5  RDW 12.2 12.1 12.0  LYMPHSABS -- -- --  MONOABS -- -- --  EOSABS -- -- --  BASOSABS -- -- --  BANDABS -- -- --    Chemistries   Lab 11/09/11 0613 11/08/11 0649 11/07/11 2254  NA 136 141 143  K 4.1 4.3 4.1  CL 102 104 103  CO2 23 27 24   GLUCOSE 198* 120* 95  BUN 14 18 18   CREATININE 0.70 0.76 0.71  CALCIUM 8.1* 9.1 10.8*  MG -- 1.4* --    CBG:  Lab 11/09/11 0807 11/08/11 2355 11/08/11 2235 11/08/11 1946 11/08/11 1825  GLUCAP 195* 142* 140* 105* 127*    GFR Estimated Creatinine Clearance: 56 ml/min (by C-G formula based on Cr of 0.7).  Coagulation profile No results found for this basename: INR:5,PROTIME:5 in the last 168 hours  Cardiac Enzymes No results found for this basename: CK:3,CKMB:3,TROPONINI:3,MYOGLOBIN:3 in the last 168 hours  No components found with this basename: POCBNP:3 No results found for this basename: DDIMER:2 in the last 72 hours No results found for this basename: HGBA1C:2 in the last 72 hours No results found for this basename: CHOL:2,HDL:2,LDLCALC:2,TRIG:2,CHOLHDL:2,LDLDIRECT:2 in the  last 72 hours No results found for this basename: TSH,T4TOTAL,FREET3,T3FREE,THYROIDAB in the last 72 hours No results found for this basename: VITAMINB12:2,FOLATE:2,FERRITIN:2,TIBC:2,IRON:2,RETICCTPCT:2 in the last 72 hours No results found for this basename: LIPASE:2,AMYLASE:2 in the last 72 hours  Urine Studies No results found for this basename: UACOL:2,UAPR:2,USPG:2,UPH:2,UTP:2,UGL:2,UKET:2,UBIL:2,UHGB:2,UNIT:2,UROB:2,ULEU:2,UEPI:2,UWBC:2,URBC:2,UBAC:2,CAST:2,CRYS:2,UCOM:2,BILUA:2 in the last 72 hours  MICROBIOLOGY: Recent Results (from the past 240 hour(s))  SURGICAL PCR SCREEN     Status: Normal    Collection Time   11/08/11  1:51 AM      Component Value Range Status Comment   MRSA, PCR NEGATIVE  NEGATIVE Final    Staphylococcus aureus NEGATIVE  NEGATIVE Final     RADIOLOGY STUDIES/RESULTS: Dg Chest 1 View  11/07/2011  *RADIOLOGY REPORT*  Clinical Data: Left hip pain after fall.  CHEST - 1 VIEW  Comparison: 05/24/2011  Findings: Borderline heart size and pulmonary vascularity.  Mild pulmonary vascular congestion is not excluded.  Interstitial fibrosis and peribronchial thickening suggest chronic bronchitis. Emphysematous changes in the lungs.  No blunting of costophrenic angles.  No focal consolidation.  No pneumothorax.  Calcification of the aorta.  IMPRESSION: Borderline heart size and pulmonary vascularity may represent early vascular congestion.  No edema.  Emphysema and fibrosis with chronic bronchitic changes.  Original Report Authenticated By: Marlon Pel, M.D.   Dg Hip Complete Left  11/07/2011  *RADIOLOGY REPORT*  Clinical Data: Left hip pain after fall.  LEFT HIP - COMPLETE 2+ VIEW  Comparison: None.  Findings: Acute transverse fracture of the left femoral neck with superior subluxation of the distal fracture fragment.  No dislocation of the acetabular joint.  No focal bone lesion is not demonstrated.  Visualized pelvis, sacrum, and right hip appear intact.  Degenerative changes in the lower lumbar spine and hips.  IMPRESSION: Mostly transverse fracture of the left femoral neck with superior subluxation of distal fracture fragment.  Original Report Authenticated By: Marlon Pel, M.D.    MEDICATIONS: Scheduled Meds:    . antiseptic oral rinse  15 mL Mouth Rinse q12n4p  . chlorhexidine  15 mL Mouth Rinse BID  . citalopram  40 mg Oral Daily  . clindamycin (CLEOCIN) IV  600 mg Intravenous Q6H  . clindamycin (CLEOCIN) IV  900 mg Intravenous 60 min Pre-Op  . dextrose      . docusate sodium  100 mg Oral BID  . ferrous sulfate  325 mg Oral TID PC  . insulin aspart   0-15 Units Subcutaneous TID WC  . magnesium sulfate 1 - 4 g bolus IVPB  2 g Intravenous Once  . metoprolol  50 mg Oral BID  . omega-3 acid ethyl esters  1 g Oral Daily  . polyethylene glycol  17 g Oral BID  . rivaroxaban  20 mg Oral Daily  . simvastatin  40 mg Oral QPM  . DISCONTD: digoxin  250 mcg Oral Daily  . DISCONTD: enoxaparin (LOVENOX) injection  40 mg Subcutaneous Q24H  . DISCONTD: insulin aspart  0-9 Units Subcutaneous Q4H  . DISCONTD: magnesium sulfate  2 g Intravenous Once  . DISCONTD: sodium chloride  3 mL Intravenous Q12H  . DISCONTD: sodium chloride  3 mL Intravenous Q12H   Continuous Infusions:    . sodium chloride 0.9 % 1,000 mL with potassium chloride 10 mEq infusion 100 mL/hr at 11/09/11 0040  . DISCONTD: sodium chloride 75 mL/hr at 11/08/11 1230  . DISCONTD: dextrose 5 % and 0.9% NaCl 100 mL/hr at 11/08/11 1828   PRN Meds:.acetaminophen, acetaminophen, albuterol, alum &  mag hydroxide-simeth, HYDROcodone-acetaminophen, HYDROmorphone (DILAUDID) injection, magnesium citrate, menthol-cetylpyridinium, methocarbamol (ROBAXIN) IV, methocarbamol, metoCLOPramide (REGLAN) injection, metoCLOPramide, morphine injection, ondansetron (ZOFRAN) IV, ondansetron, phenol, DISCONTD: acetaminophen, DISCONTD: acetaminophen, DISCONTD:  HYDROmorphone (DILAUDID) injection DISCONTD: ondansetron (ZOFRAN) IV, DISCONTD: ondansetron, DISCONTD: sodium chloride irrigation  Antibiotics: Anti-infectives     Start     Dose/Rate Route Frequency Ordered Stop   11/09/11 0200   clindamycin (CLEOCIN) IVPB 600 mg        600 mg 100 mL/hr over 30 Minutes Intravenous Every 6 hours 11/08/11 2332 11/09/11 0852   11/08/11 1926   clindamycin (CLEOCIN) IVPB 900 mg        900 mg 100 mL/hr over 30 Minutes Intravenous 60 min pre-op 11/08/11 1926 11/08/11 2038          Jeoffrey Massed, MD  [facility] Pager:[phone]  If 7PM-7AM, please contact night-coverage [email or web address] Password  [email or web address] 11/09/2011, 12:24 PM   LOS: 2 days

## 2011-11-09 NOTE — Evaluation (Signed)
Physical Therapy Evaluation Patient Details Name: Erin Vang MRN: [id number] DOB: 20-Oct-1935 Today's Date: 11/09/2011 Time: 7829-5621 PT Time Calculation (min): 25 min  PT Assessment / Plan / Recommendation Clinical Impression  Pt s/p fall with left hip fx and resulting hip hemiarthroplasty. Pt was independent PTA but without assist at home plans for ST-SNF to recover and be able to return home alone. Pt will benefit from acute therapy to maximize mobility, gait, strength and awareness of precautions prior to discharge to increase independence and decrease burden of care.     PT Assessment  Patient needs continued PT services    Follow Up Recommendations  Skilled nursing facility    Barriers to Discharge Decreased caregiver support      Equipment Recommendations  Defer to next venue    Recommendations for Other Services     Frequency Min 5X/week    Precautions / Restrictions Precautions Precautions: Posterior Hip Precaution Booklet Issued: Yes (comment) Restrictions LLE Weight Bearing: Weight bearing as tolerated   Pertinent Vitals/Pain No pain, premedicated      Mobility  Bed Mobility Bed Mobility: Supine to Sit;Sitting - Scoot to Edge of Bed Supine to Sit: 3: Mod assist;HOB elevated Sitting - Scoot to Edge of Bed: 3: Mod assist Details for Bed Mobility Assistance: cueing for sequence to pivot to EOB and assist to elevate trunk as well as to pivot pelvis with assist of pad to scoot to EOB Transfers Transfers: Sit to Stand;Stand to Sit Sit to Stand: 3: Mod assist;From bed Stand to Sit: 4: Min assist;To chair/3-in-1 Details for Transfer Assistance: cueing for hand and foot placement Ambulation/Gait Ambulation/Gait Assistance: 4: Min assist Ambulation Distance (Feet): 12 Feet Assistive device: Rolling walker Ambulation/Gait Assistance Details: cueing for sequence and precautions Gait Pattern: Step-to pattern;Trunk flexed Stairs: No    Exercises General Exercises  - Lower Extremity Long Arc Quad: AROM;Left;10 reps;Seated Hip ABduction/ADduction: AAROM;10 reps;Left;Seated   PT Diagnosis: Difficulty walking  PT Problem List: Decreased strength;Decreased range of motion;Decreased activity tolerance;Decreased knowledge of use of DME;Decreased knowledge of precautions PT Treatment Interventions: DME instruction;Gait training;Stair training;Functional mobility training;Therapeutic activities;Therapeutic exercise;Patient/family education   PT Goals Acute Rehab PT Goals PT Goal Formulation: With patient/family Time For Goal Achievement: 11/09/11 Potential to Achieve Goals: Good Pt will go Supine/Side to Sit: with supervision PT Goal: Supine/Side to Sit - Progress: Goal set today Pt will go Sit to Supine/Side: with supervision PT Goal: Sit to Supine/Side - Progress: Goal set today Pt will go Sit to Stand: with supervision PT Goal: Sit to Stand - Progress: Goal set today Pt will go Stand to Sit: with supervision PT Goal: Stand to Sit - Progress: Goal set today Pt will Ambulate: 51 - 150 feet;with supervision;with rolling walker PT Goal: Ambulate - Progress: Goal set today Pt will Perform Home Exercise Program: with supervision, verbal cues required/provided PT Goal: Perform Home Exercise Program - Progress: Goal set today Additional Goals Additional Goal #1: Pt will independently state and adhere to all posterior hip precautions PT Goal: Additional Goal #1 - Progress: Goal set today  Visit Information  Last PT Received On: 11/09/11 Assistance Needed: +2 (for chair and safety)    Subjective Data  Subjective: I would like to try to get up Patient Stated Goal: be able to return home   Prior Functioning  Home Living Lives With: Alone Available Help at Discharge: Available PRN/intermittently Type of Home: House Home Access: Stairs to enter Entrance Stairs-Number of Steps: 2 Bathroom Toilet: Standard Prior Function Level  of Independence:  Independent Able to Take Stairs?: Yes Driving: Yes Vocation: Retired Musician: No difficulties    Cognition  Overall Cognitive Status: Appears within functional limits for tasks assessed/performed Arousal/Alertness: Awake/alert Orientation Level: Appears intact for tasks assessed Behavior During Session: [facility] for tasks performed    Extremity/Trunk Assessment Right Upper Extremity Assessment RUE ROM/Strength/Tone: [facility] for tasks assessed Left Upper Extremity Assessment LUE ROM/Strength/Tone: [facility] for tasks assessed Right Lower Extremity Assessment RLE ROM/Strength/Tone: [facility] for tasks assessed Left Lower Extremity Assessment LLE ROM/Strength/Tone: Deficits;Due to pain;Due to precautions   Balance    End of Session PT - End of Session Equipment Utilized During Treatment: Gait belt Activity Tolerance: Patient tolerated treatment well Patient left: in chair;with call bell/phone within reach;with family/visitor present Nurse Communication: Mobility status  GP     Delorse Lek 11/09/2011, 2:58 PM  Delaney Meigs, PT [phone]

## 2011-11-10 LAB — CBC
HCT: 30.8 % — ABNORMAL LOW (ref 36.0–46.0)
Hemoglobin: 10.3 g/dL — ABNORMAL LOW (ref 12.0–15.0)
MCHC: 33.4 g/dL (ref 30.0–36.0)
RBC: 3.48 MIL/uL — ABNORMAL LOW (ref 3.87–5.11)
WBC: 15.6 10*3/uL — ABNORMAL HIGH (ref 4.0–10.5)

## 2011-11-10 LAB — GLUCOSE, CAPILLARY
Glucose-Capillary: 185 mg/dL — ABNORMAL HIGH (ref 70–99)
Glucose-Capillary: 172 mg/dL — ABNORMAL HIGH (ref 70–99)

## 2011-11-10 LAB — BASIC METABOLIC PANEL
BUN: 14 mg/dL (ref 6–23)
CO2: 25 mEq/L (ref 19–32)
Chloride: 98 mEq/L (ref 96–112)
GFR calc non Af Amer: 84 mL/min — ABNORMAL LOW (ref >90)
Glucose, Bld: 188 mg/dL — ABNORMAL HIGH (ref 70–99)
Potassium: 3.8 mEq/L (ref 3.5–5.1)
Sodium: 133 mEq/L — ABNORMAL LOW (ref 135–145)

## 2011-11-10 MED ORDER — CITALOPRAM HYDROBROMIDE 20 MG PO TABS
20.0000 mg | ORAL_TABLET | Freq: Every day | ORAL | Status: DC
  Administered 2011-11-11 – 2011-11-12 (×2): 20 mg via ORAL
  Filled 2011-11-10 (×2): qty 1

## 2011-11-10 MED ORDER — DIGOXIN 250 MCG PO TABS
0.2500 mg | ORAL_TABLET | Freq: Every day | ORAL | Status: DC
  Administered 2011-11-10 – 2011-11-12 (×3): 0.25 mg via ORAL
  Filled 2011-11-10 (×3): qty 1

## 2011-11-10 NOTE — Progress Notes (Signed)
PATIENT DETAILS Name: Erin Vang Age: 76 y.o. Sex: female Date of Birth: 25-May-1935 Admit Date: 11/07/2011 Admitting Physician Eduard Clos, MD [id number], Lenon Curt, MD  Subjective: S/P left hip repair-no major events overnight, slightly drowsy this am  Assessment/Plan: Principal Problem:  *Closed left hip fracture -POD # 2 -d/c foley on 7/19-urinating ok -spoke with Dr Charlann Boxer on 7/19-ok to transition from Lovenox to Xarelto -await PT  Active Problems: Fall -this was purely mechanical-no syncopal episode  Afib -chronic issue -continue with  Metoprolol -Digoxin now on hold -restart Xarelto 7/19-after OK with ortho -last Echo-05/25/11-EF 55%  Sinus Pauses -per cards - Digoxin resumed  Leukocytosis -likely reactive, low grade fever overnight -non toxic appearance-will closely monitor -no foci evident -repeat CBC in am   DIABETES MELLITUS, TYPE II -CBG's stable -continue SSI -resume Glipizide when able   HYPERTENSION -c/w Metoprolol   COPD (chronic obstructive pulmonary disease) -lungs currently clear -as needed albuterol nebs  Dyslipidemia -continue with Statin  Disposition: Remain inpatient- SNF on discharge  DVT Prophylaxis: Starting Xarelto  Code Status: Full code   Procedures: -for hip repair 7/18  CONSULTS:  orthopedic surgery  PHYSICAL EXAM: Vital signs in last 24 hours: Filed Vitals:   11/09/11 1500 11/09/11 1555 11/09/11 2108 11/10/11 0606  BP: 90/52  121/69 136/78  Pulse: 83  122 109  Temp: 97.9 F (36.6 C)  100.3 F (37.9 C) 100 F (37.8 C)  TempSrc: Oral  Oral Oral  Resp: 18 18 18 18   Height:      Weight:      SpO2: 98% 97% 96% 91%    Weight change:  Body mass index is 23.37 kg/(m^2).   Gen Exam: Awake and alert with clear speech-but slightly drowsy Neck: Supple, No JVD.  Chest: B/L Clear.   CVS: S1 S2 Regular, no murmurs.  Abdomen: soft, BS +, non tender, non distended.  Extremities: no edema, lower  extremities warm to touch. Neurologic: Non Focal.   Skin: No Rash.   Wounds: N/A.    Intake/Output from previous day:  Intake/Output Summary (Last 24 hours) at 11/10/11 1050 Last data filed at 11/10/11 0600  Gross per 24 hour  Intake      0 ml  Output    200 ml  Net   -200 ml     LAB RESULTS: CBC  Lab 11/10/11 0714 11/09/11 0613 11/08/11 0649 11/07/11 2254  WBC 15.6* 14.6* 11.5* 17.2*  HGB 10.3* 11.0* 12.1 13.9  HCT 30.8* 33.8* 36.6 40.3  PLT 166 175 185 231  MCV 88.5 90.6 90.1 88.2  MCH 29.6 29.5 29.8 30.4  MCHC 33.4 32.5 33.1 34.5  RDW 12.2 12.2 12.1 12.0  LYMPHSABS -- -- -- --  MONOABS -- -- -- --  EOSABS -- -- -- --  BASOSABS -- -- -- --  BANDABS -- -- -- --    Chemistries   Lab 11/10/11 0714 11/09/11 0613 11/08/11 0649 11/07/11 2254  NA 133* 136 141 143  K 3.8 4.1 4.3 4.1  CL 98 102 104 103  CO2 25 23 27 24   GLUCOSE 188* 198* 120* 95  BUN 14 14 18 18   CREATININE 0.69 0.70 0.76 0.71  CALCIUM 8.4 8.1* 9.1 10.8*  MG -- -- 1.4* --    CBG:  Lab 11/10/11 0756 11/09/11 2103 11/09/11 1619 11/09/11 1251 11/09/11 0807  GLUCAP 185* 138* 192* 215* 195*    GFR Estimated Creatinine Clearance: 56 ml/min (by C-G formula based on Cr of 0.69).  Coagulation profile No results found for this basename: INR:5,PROTIME:5 in the last 168 hours  Cardiac Enzymes No results found for this basename: CK:3,CKMB:3,TROPONINI:3,MYOGLOBIN:3 in the last 168 hours  No components found with this basename: POCBNP:3 No results found for this basename: DDIMER:2 in the last 72 hours No results found for this basename: HGBA1C:2 in the last 72 hours No results found for this basename: CHOL:2,HDL:2,LDLCALC:2,TRIG:2,CHOLHDL:2,LDLDIRECT:2 in the last 72 hours No results found for this basename: TSH,T4TOTAL,FREET3,T3FREE,THYROIDAB in the last 72 hours No results found for this basename: VITAMINB12:2,FOLATE:2,FERRITIN:2,TIBC:2,IRON:2,RETICCTPCT:2 in the last 72 hours No results found for  this basename: LIPASE:2,AMYLASE:2 in the last 72 hours  Urine Studies No results found for this basename: UACOL:2,UAPR:2,USPG:2,UPH:2,UTP:2,UGL:2,UKET:2,UBIL:2,UHGB:2,UNIT:2,UROB:2,ULEU:2,UEPI:2,UWBC:2,URBC:2,UBAC:2,CAST:2,CRYS:2,UCOM:2,BILUA:2 in the last 72 hours  MICROBIOLOGY: Recent Results (from the past 240 hour(s))  SURGICAL PCR SCREEN     Status: Normal   Collection Time   11/08/11  1:51 AM      Component Value Range Status Comment   MRSA, PCR NEGATIVE  NEGATIVE Final    Staphylococcus aureus NEGATIVE  NEGATIVE Final     RADIOLOGY STUDIES/RESULTS: Dg Chest 1 View  11/07/2011  *RADIOLOGY REPORT*  Clinical Data: Left hip pain after fall.  CHEST - 1 VIEW  Comparison: 05/24/2011  Findings: Borderline heart size and pulmonary vascularity.  Mild pulmonary vascular congestion is not excluded.  Interstitial fibrosis and peribronchial thickening suggest chronic bronchitis. Emphysematous changes in the lungs.  No blunting of costophrenic angles.  No focal consolidation.  No pneumothorax.  Calcification of the aorta.  IMPRESSION: Borderline heart size and pulmonary vascularity may represent early vascular congestion.  No edema.  Emphysema and fibrosis with chronic bronchitic changes.  Original Report Authenticated By: Marlon Pel, M.D.   Dg Hip Complete Left  11/07/2011  *RADIOLOGY REPORT*  Clinical Data: Left hip pain after fall.  LEFT HIP - COMPLETE 2+ VIEW  Comparison: None.  Findings: Acute transverse fracture of the left femoral neck with superior subluxation of the distal fracture fragment.  No dislocation of the acetabular joint.  No focal bone lesion is not demonstrated.  Visualized pelvis, sacrum, and right hip appear intact.  Degenerative changes in the lower lumbar spine and hips.  IMPRESSION: Mostly transverse fracture of the left femoral neck with superior subluxation of distal fracture fragment.  Original Report Authenticated By: Marlon Pel, M.D.     MEDICATIONS: Scheduled Meds:    . antiseptic oral rinse  15 mL Mouth Rinse q12n4p  . chlorhexidine  15 mL Mouth Rinse BID  . citalopram  40 mg Oral Daily  . digoxin  0.25 mg Oral Daily  . docusate sodium  100 mg Oral BID  . ferrous sulfate  325 mg Oral TID PC  . insulin aspart  0-15 Units Subcutaneous TID WC  . metoprolol  50 mg Oral BID  . omega-3 acid ethyl esters  1 g Oral Daily  . polyethylene glycol  17 g Oral BID  . rivaroxaban  20 mg Oral Q24H  . simvastatin  40 mg Oral QPM  . DISCONTD: enoxaparin (LOVENOX) injection  40 mg Subcutaneous Q24H   Continuous Infusions:    . DISCONTD: sodium chloride 0.9 % 1,000 mL with potassium chloride 10 mEq infusion 100 mL/hr at 11/09/11 0040   PRN Meds:.acetaminophen, acetaminophen, albuterol, alum & mag hydroxide-simeth, HYDROcodone-acetaminophen, HYDROmorphone (DILAUDID) injection, menthol-cetylpyridinium, methocarbamol (ROBAXIN) IV, methocarbamol, metoCLOPramide (REGLAN) injection, metoCLOPramide, morphine injection, ondansetron (ZOFRAN) IV, ondansetron, phenol  Antibiotics: Anti-infectives     Start     Dose/Rate Route Frequency  Ordered Stop   11/09/11 0200   clindamycin (CLEOCIN) IVPB 600 mg        600 mg 100 mL/hr over 30 Minutes Intravenous Every 6 hours 11/08/11 2332 11/09/11 0852   11/08/11 1926   clindamycin (CLEOCIN) IVPB 900 mg        900 mg 100 mL/hr over 30 Minutes Intravenous 60 min pre-op 11/08/11 1926 11/08/11 2038          Jeoffrey Massed, MD  [facility] Pager:[phone]  If 7PM-7AM, please contact night-coverage [email or web address] Password [email or web address] 11/10/2011, 10:50 AM   LOS: 3 days

## 2011-11-10 NOTE — Progress Notes (Signed)
Physical Therapy Treatment Patient Details Name: BETHENNY LOSEE MRN: [id number] DOB: [birth year] Today's Date: 11/10/2011 Time: 1191-4782 PT Time Calculation (min): 17 min  PT Assessment / Plan / Recommendation Comments on Treatment Session  Patient more fatiqued this session. Woke up a little more after sitting up but unable to tolerate stepping at all in attempt of mobility. RN stated that patient had been asleep all morning.     Follow Up Recommendations  Skilled nursing facility    Barriers to Discharge        Equipment Recommendations  Defer to next venue    Recommendations for Other Services    Frequency Min 5X/week   Plan Discharge plan remains appropriate;Frequency remains appropriate    Precautions / Restrictions Precautions Precautions: Posterior Hip Precaution Comments: Patient unable to recall precautions.  Restrictions LLE Weight Bearing: Weight bearing as tolerated   Pertinent Vitals/Pain     Mobility  Bed Mobility Supine to Sit: 1: +2 Total assist Supine to Sit: Patient Percentage: 50% Sitting - Scoot to Edge of Bed: 2: Max assist Details for Bed Mobility Assistance: A with all aspects. Patient able to pull up slightly to sit. Patient needed A to lift shoulders and trunk OOB Transfers Transfers: Stand Pivot Transfers Sit to Stand: 1: +2 Total assist;From bed;From chair/3-in-1;With upper extremity assist Sit to Stand: Patient Percentage: 50% Stand to Sit: 1: +2 Total assist;To chair/3-in-1;To bed;With upper extremity assist Stand to Sit: Patient Percentage: 50% Stand Pivot Transfers: 1: +2 Total assist Stand Pivot Transfers: Patient Percentage: 50% Details for Transfer Assistance: Patient needed A to inititate stand and to position LEs with pivot. Patient with posterior lean and B LE buckling. Cues for safe technique Ambulation/Gait Ambulation/Gait Assistance: Not tested (comment)    Exercises General Exercises - Lower Extremity Heel Slides: AAROM;Right;10  reps   PT Diagnosis:    PT Problem List:   PT Treatment Interventions:     PT Goals Acute Rehab PT Goals PT Goal: Supine/Side to Sit - Progress: Not progressing PT Goal: Sit to Supine/Side - Progress: Not progressing PT Goal: Sit to Stand - Progress: Not progressing PT Goal: Stand to Sit - Progress: Not progressing PT Goal: Ambulate - Progress: Not progressing  Visit Information  Last PT Received On: 11/10/11 Assistance Needed: +2    Subjective Data      Cognition  Overall Cognitive Status: Appears within functional limits for tasks assessed/performed Arousal/Alertness: Awake/alert Orientation Level: Appears intact for tasks assessed Behavior During Session: [facility] for tasks performed    Balance     End of Session PT - End of Session Equipment Utilized During Treatment: Gait belt Activity Tolerance: Patient limited by fatigue Patient left: in chair;with call bell/phone within reach Nurse Communication: Mobility status   GP     Fredrich Birks 11/10/2011, 1:02 PM 11/10/2011 Fredrich Birks PTA [phone] pager [phone] office

## 2011-11-10 NOTE — Progress Notes (Signed)
   Subjective: Eating breakfast. Some post-op hip pain. No chest pain.   Objective: Blood pressure 136/78, pulse 109, temperature 100 F (37.8 C), temperature source Oral, resp. rate 18, height 5' 5.2" (1.656 m), weight 141 lb 5 oz (64.1 kg), SpO2 91.00%.  Intake/Output Summary (Last 24 hours) at 11/10/11 0837 Last data filed at 11/10/11 0600  Gross per 24 hour  Intake      0 ml  Output    200 ml  Net   -200 ml   Telemetry -  AF around 100 BPM.  Exam -   General - NAD.  Lungs - Clear, nonlabored.  Cardiac - Irregularly irregular, no gallop.  Extremities - No pitting.  Lab Results -  Basic Metabolic Panel:  Lab 11/09/11 9604 11/08/11 0649 11/07/11 2254  NA 136 141 143  K 4.1 4.3 4.1  CL 102 104 103  CO2 23 27 24   GLUCOSE 198* 120* 95  BUN 14 18 18   CREATININE 0.70 0.76 0.71  CALCIUM 8.1* 9.1 10.8*  MG -- 1.4* --    Liver Function Tests:  Lab 11/08/11 0649 11/07/11 2254  AST 170* 36  ALT 79* 25  ALKPHOS 73 75  BILITOT 0.5 0.4  PROT 6.0 7.2  ALBUMIN 3.2* 4.0    CBC:  Lab 11/10/11 0714 11/09/11 0613 11/08/11 0649  WBC 15.6* 14.6* 11.5*  HGB 10.3* 11.0* 12.1  HCT 30.8* 33.8* 36.6  MCV 88.5 90.6 90.1  PLT 166 175 185    Current Medications    . antiseptic oral rinse  15 mL Mouth Rinse q12n4p  . chlorhexidine  15 mL Mouth Rinse BID  . citalopram  40 mg Oral Daily  . clindamycin (CLEOCIN) IV  600 mg Intravenous Q6H  . docusate sodium  100 mg Oral BID  . ferrous sulfate  325 mg Oral TID PC  . insulin aspart  0-15 Units Subcutaneous TID WC  . metoprolol  50 mg Oral BID  . omega-3 acid ethyl esters  1 g Oral Daily  . polyethylene glycol  17 g Oral BID  . rivaroxaban  20 mg Oral Q24H  . simvastatin  40 mg Oral QPM  . DISCONTD: enoxaparin (LOVENOX) injection  40 mg Subcutaneous Q24H    Assessment:  1. Persistent AF with mild RVR. History of PAF.  2. Status post left arthroplasty bipolar hip.  Plan:  Resume Digoxin, continue metoprolol. Can  resume Xarelto when OK with surgical team. ECG AM.   Jonelle Sidle, M.D., F.A.C.C.

## 2011-11-11 DIAGNOSIS — D649 Anemia, unspecified: Secondary | ICD-10-CM

## 2011-11-11 LAB — CBC
HCT: 30.1 % — ABNORMAL LOW (ref 36.0–46.0)
Hemoglobin: 10.2 g/dL — ABNORMAL LOW (ref 12.0–15.0)
MCHC: 33.9 g/dL (ref 30.0–36.0)
WBC: 13.6 10*3/uL — ABNORMAL HIGH (ref 4.0–10.5)

## 2011-11-11 LAB — BASIC METABOLIC PANEL
Chloride: 102 mEq/L (ref 96–112)
GFR calc Af Amer: >90 mL/min (ref >90)
GFR calc non Af Amer: 86 mL/min — ABNORMAL LOW (ref >90)
Glucose, Bld: 95 mg/dL (ref 70–99)
Potassium: 3.4 mEq/L — ABNORMAL LOW (ref 3.5–5.1)
Sodium: 140 mEq/L (ref 135–145)

## 2011-11-11 LAB — MAGNESIUM: Magnesium: 1.6 mg/dL (ref 1.5–2.5)

## 2011-11-11 LAB — GLUCOSE, CAPILLARY
Glucose-Capillary: 95 mg/dL (ref 70–99)
Glucose-Capillary: 119 mg/dL — ABNORMAL HIGH (ref 70–99)

## 2011-11-11 MED ORDER — MENTHOL 3 MG MT LOZG: 1.0000 | LOZENGE | OROMUCOSAL | Status: DC | PRN

## 2011-11-11 MED ORDER — POTASSIUM CHLORIDE CRYS ER 20 MEQ PO TBCR
20.0000 meq | EXTENDED_RELEASE_TABLET | Freq: Once | ORAL | Status: AC
  Administered 2011-11-11: 20 meq via ORAL
  Filled 2011-11-11: qty 1

## 2011-11-11 MED ORDER — MAGNESIUM SULFATE 40 MG/ML IJ SOLN
2.0000 g | Freq: Once | INTRAMUSCULAR | Status: AC
  Administered 2011-11-11: 2 g via INTRAVENOUS
  Filled 2011-11-11: qty 50

## 2011-11-11 NOTE — Progress Notes (Signed)
PATIENT DETAILS Name: ZANIYA MCAULAY Age: 76 y.o. Sex: female Date of Birth: [birth year] Admit Date: 11/07/2011 Admitting Physician Eduard Clos, MD NWG:NFAOZ, Lenon Curt, MD  Subjective: Feels better, more awake and less drowsy when compared to yesterday  Assessment/Plan: Principal Problem:  *Closed left hip fracture -POD # 3 -d/c foley on 7/19-urinating ok -spoke with Dr Charlann Boxer on 7/19-ok to transition from Lovenox to Xarelto -seen by PT-for SNF -minimize narcotics as much as possible-last BM the day before-on Miralax and Colace  Active Problems: Fall -this was purely mechanical-no syncopal episode  Afib -chronic issue -continue with  Metoprolol -Digoxin restarted by cards on 7/20 -restarted Xarelto 7/19-after OK with ortho -last Echo-05/25/11-EF 55%  Sinus Pauses -per cards - Digoxin resumed -monitor on telemetry  Leukocytosis -likely reactive, low grade fever overnight -non toxic appearance-will closely monitor -no foci evident -CBC decreasing -check periodically  Anemia -likely 2/2 to peri-operative blood loss -Hb stable at 10.2 -continue with FeSO4   DIABETES MELLITUS, TYPE II -CBG's stable -continue SSI -resume Glipizide when able   HYPERTENSION -c/w Metoprolol   COPD (chronic obstructive pulmonary disease) -lungs currently clear -as needed albuterol nebs  Dyslipidemia -continue with Statin  Depression -decrease Celexa to 20 mg  Disposition: Remain inpatient- SNF on discharge  DVT Prophylaxis: On Xarelto  Code Status: Full code   Procedures: -for hip repair 7/18  CONSULTS:  orthopedic surgery  PHYSICAL EXAM: Vital signs in last 24 hours: Filed Vitals:   11/10/11 0606 11/10/11 1500 11/10/11 2104 11/11/11 0427  BP: 136/78 107/58 132/76 128/72  Pulse: 109 104 104 81  Temp: 100 F (37.8 C) 98.6 F (37 C) 98.7 F (37.1 C) 98.9 F (37.2 C)  TempSrc: Oral Oral Oral Oral  Resp: 18 18 17 17   Height:      Weight:      SpO2:  91% 96% 94% 94%    Weight change:  Body mass index is 23.37 kg/(m^2).   Gen Exam: Awake and alert with clear speech-not drowsy Neck: Supple, No JVD.  Chest: B/L Clear.   CVS: S1 S2 Regular, no murmurs.  Abdomen: soft, BS +, non tender, non distended.  Extremities: no edema, lower extremities warm to touch. Neurologic: Non Focal.   Skin: No Rash.   Wounds: N/A.    Intake/Output from previous day:  Intake/Output Summary (Last 24 hours) at 11/11/11 0919 Last data filed at 11/10/11 1500  Gross per 24 hour  Intake    240 ml  Output      0 ml  Net    240 ml     LAB RESULTS: CBC  Lab 11/11/11 0603 11/10/11 0714 11/09/11 0613 11/08/11 0649 11/07/11 2254  WBC 13.6* 15.6* 14.6* 11.5* 17.2*  HGB 10.2* 10.3* 11.0* 12.1 13.9  HCT 30.1* 30.8* 33.8* 36.6 40.3  PLT 186 166 175 185 231  MCV 88.5 88.5 90.6 90.1 88.2  MCH 30.0 29.6 29.5 29.8 30.4  MCHC 33.9 33.4 32.5 33.1 34.5  RDW 12.3 12.2 12.2 12.1 12.0  LYMPHSABS -- -- -- -- --  MONOABS -- -- -- -- --  EOSABS -- -- -- -- --  BASOSABS -- -- -- -- --  BANDABS -- -- -- -- --    Chemistries   Lab 11/11/11 0603 11/10/11 0714 11/09/11 0613 11/08/11 0649 11/07/11 2254  NA 140 133* 136 141 143  K 3.4* 3.8 4.1 4.3 4.1  CL 102 98 102 104 103  CO2 27 25 23 27 24   GLUCOSE 95 188*  198* 120* 95  BUN 14 14 14 18 18   CREATININE 0.63 0.69 0.70 0.76 0.71  CALCIUM 8.6 8.4 8.1* 9.1 10.8*  MG 1.6 -- -- 1.4* --    CBG:  Lab 11/11/11 0759 11/10/11 2126 11/10/11 1701 11/10/11 1142 11/10/11 0756  GLUCAP 95 121* 229* 172* 185*    GFR Estimated Creatinine Clearance: 56 ml/min (by C-G formula based on Cr of 0.63).  Coagulation profile No results found for this basename: INR:5,PROTIME:5 in the last 168 hours  Cardiac Enzymes No results found for this basename: CK:3,CKMB:3,TROPONINI:3,MYOGLOBIN:3 in the last 168 hours  No components found with this basename: POCBNP:3 No results found for this basename: DDIMER:2 in the last 72 hours No  results found for this basename: HGBA1C:2 in the last 72 hours No results found for this basename: CHOL:2,HDL:2,LDLCALC:2,TRIG:2,CHOLHDL:2,LDLDIRECT:2 in the last 72 hours No results found for this basename: TSH,T4TOTAL,FREET3,T3FREE,THYROIDAB in the last 72 hours No results found for this basename: VITAMINB12:2,FOLATE:2,FERRITIN:2,TIBC:2,IRON:2,RETICCTPCT:2 in the last 72 hours No results found for this basename: LIPASE:2,AMYLASE:2 in the last 72 hours  Urine Studies No results found for this basename: UACOL:2,UAPR:2,USPG:2,UPH:2,UTP:2,UGL:2,UKET:2,UBIL:2,UHGB:2,UNIT:2,UROB:2,ULEU:2,UEPI:2,UWBC:2,URBC:2,UBAC:2,CAST:2,CRYS:2,UCOM:2,BILUA:2 in the last 72 hours  MICROBIOLOGY: Recent Results (from the past 240 hour(s))  SURGICAL PCR SCREEN     Status: Normal   Collection Time   11/08/11  1:51 AM      Component Value Range Status Comment   MRSA, PCR NEGATIVE  NEGATIVE Final    Staphylococcus aureus NEGATIVE  NEGATIVE Final     RADIOLOGY STUDIES/RESULTS: Dg Chest 1 View  11/07/2011  *RADIOLOGY REPORT*  Clinical Data: Left hip pain after fall.  CHEST - 1 VIEW  Comparison: 05/24/2011  Findings: Borderline heart size and pulmonary vascularity.  Mild pulmonary vascular congestion is not excluded.  Interstitial fibrosis and peribronchial thickening suggest chronic bronchitis. Emphysematous changes in the lungs.  No blunting of costophrenic angles.  No focal consolidation.  No pneumothorax.  Calcification of the aorta.  IMPRESSION: Borderline heart size and pulmonary vascularity may represent early vascular congestion.  No edema.  Emphysema and fibrosis with chronic bronchitic changes.  Original Report Authenticated By: Marlon Pel, M.D.   Dg Hip Complete Left  11/07/2011  *RADIOLOGY REPORT*  Clinical Data: Left hip pain after fall.  LEFT HIP - COMPLETE 2+ VIEW  Comparison: None.  Findings: Acute transverse fracture of the left femoral neck with superior subluxation of the distal fracture  fragment.  No dislocation of the acetabular joint.  No focal bone lesion is not demonstrated.  Visualized pelvis, sacrum, and right hip appear intact.  Degenerative changes in the lower lumbar spine and hips.  IMPRESSION: Mostly transverse fracture of the left femoral neck with superior subluxation of distal fracture fragment.  Original Report Authenticated By: Marlon Pel, M.D.    MEDICATIONS: Scheduled Meds:    . antiseptic oral rinse  15 mL Mouth Rinse q12n4p  . chlorhexidine  15 mL Mouth Rinse BID  . citalopram  20 mg Oral Daily  . digoxin  0.25 mg Oral Daily  . docusate sodium  100 mg Oral BID  . ferrous sulfate  325 mg Oral TID PC  . insulin aspart  0-15 Units Subcutaneous TID WC  . magnesium sulfate 1 - 4 g bolus IVPB  2 g Intravenous Once  . metoprolol  50 mg Oral BID  . omega-3 acid ethyl esters  1 g Oral Daily  . polyethylene glycol  17 g Oral BID  . potassium chloride  20 mEq Oral Once  .  rivaroxaban  20 mg Oral Q24H  . simvastatin  40 mg Oral QPM  . DISCONTD: citalopram  40 mg Oral Daily   Continuous Infusions:   PRN Meds:.acetaminophen, acetaminophen, albuterol, alum & mag hydroxide-simeth, HYDROcodone-acetaminophen, HYDROmorphone (DILAUDID) injection, menthol-cetylpyridinium, methocarbamol (ROBAXIN) IV, methocarbamol, metoCLOPramide (REGLAN) injection, metoCLOPramide, morphine injection, ondansetron (ZOFRAN) IV, ondansetron, phenol  Antibiotics: Anti-infectives     Start     Dose/Rate Route Frequency Ordered Stop   11/09/11 0200   clindamycin (CLEOCIN) IVPB 600 mg        600 mg 100 mL/hr over 30 Minutes Intravenous Every 6 hours 11/08/11 2332 11/09/11 0852   11/08/11 1926   clindamycin (CLEOCIN) IVPB 900 mg        900 mg 100 mL/hr over 30 Minutes Intravenous 60 min pre-op 11/08/11 1926 11/08/11 2038          Jeoffrey Massed, MD  [facility] Pager:[phone]  If 7PM-7AM, please contact night-coverage [email or web address] Password  [email or web address] 11/11/2011, 9:19 AM   LOS: 4 days

## 2011-11-11 NOTE — Progress Notes (Signed)
   Subjective: Less hip pain. No palpitations or chest pain.   Objective: Blood pressure 128/72, pulse 81, temperature 98.9 F (37.2 C), temperature source Oral, resp. rate 17, height 5' 5.2" (1.656 m), weight 141 lb 5 oz (64.1 kg), SpO2 94.00%.  Intake/Output Summary (Last 24 hours) at 11/11/11 0806 Last data filed at 11/10/11 1500  Gross per 24 hour  Intake    480 ml  Output      0 ml  Net    480 ml    Telemetry - AF, rates recently under 100.  Exam -   General - NAD.   Lungs - Clear, nonlabored.   Cardiac - Irregularly irregular, no gallop.   Extremities - No pitting.   Lab Results -  Basic Metabolic Panel:  Lab 11/11/11 1610 11/10/11 0714 11/09/11 0613 11/08/11 0649  NA 140 133* 136 --  K 3.4* 3.8 4.1 --  CL 102 98 102 --  CO2 27 25 23  --  GLUCOSE 95 188* 198* --  BUN 14 14 14  --  CREATININE 0.63 0.69 0.70 --  CALCIUM 8.6 8.4 8.1* --  MG 1.6 -- -- 1.4*    Liver Function Tests:  Lab 11/08/11 0649 11/07/11 2254  AST 170* 36  ALT 79* 25  ALKPHOS 73 75  BILITOT 0.5 0.4  PROT 6.0 7.2  ALBUMIN 3.2* 4.0    CBC:  Lab 11/11/11 0603 11/10/11 0714 11/09/11 0613  WBC 13.6* 15.6* 14.6*  HGB 10.2* 10.3* 11.0*  HCT 30.1* 30.8* 33.8*  MCV 88.5 88.5 90.6  PLT 186 166 175    Current Medications    . antiseptic oral rinse  15 mL Mouth Rinse q12n4p  . chlorhexidine  15 mL Mouth Rinse BID  . citalopram  20 mg Oral Daily  . digoxin  0.25 mg Oral Daily  . docusate sodium  100 mg Oral BID  . ferrous sulfate  325 mg Oral TID PC  . insulin aspart  0-15 Units Subcutaneous TID WC  . metoprolol  50 mg Oral BID  . omega-3 acid ethyl esters  1 g Oral Daily  . polyethylene glycol  17 g Oral BID  . rivaroxaban  20 mg Oral Q24H  . simvastatin  40 mg Oral QPM  . DISCONTD: citalopram  40 mg Oral Daily    Assessment:  1. Persistent AF with better rate control. History of PAF.   2. Status post left arthroplasty bipolar hip.   Plan:  Continue Digoxin and  Lopressor, back on Xarelto. Replete potassium.  Jonelle Sidle, M.D., F.A.C.C.

## 2011-11-12 ENCOUNTER — Encounter (HOSPITAL_COMMUNITY): Payer: Self-pay | Admitting: Orthopedic Surgery

## 2011-11-12 DIAGNOSIS — F411 Generalized anxiety disorder: Secondary | ICD-10-CM

## 2011-11-12 LAB — GLUCOSE, CAPILLARY
Glucose-Capillary: 131 mg/dL — ABNORMAL HIGH (ref 70–99)
Glucose-Capillary: 126 mg/dL — ABNORMAL HIGH (ref 70–99)

## 2011-11-12 MED ORDER — FERROUS SULFATE 325 (65 FE) MG PO TABS: 325.0000 mg | ORAL_TABLET | Freq: Three times a day (TID) | ORAL | Status: DC

## 2011-11-12 MED ORDER — DSS 100 MG PO CAPS: 100.0000 mg | ORAL_CAPSULE | Freq: Two times a day (BID) | ORAL | Status: AC

## 2011-11-12 MED ORDER — HYDROCODONE-ACETAMINOPHEN 5-325 MG PO TABS: 1.0000 | ORAL_TABLET | Freq: Four times a day (QID) | ORAL | Status: AC | PRN

## 2011-11-12 MED ORDER — ACETAMINOPHEN 325 MG PO TABS: 650.0000 mg | ORAL_TABLET | Freq: Four times a day (QID) | ORAL | Status: DC | PRN

## 2011-11-12 MED ORDER — POLYETHYLENE GLYCOL 3350 17 G PO PACK: 17.0000 g | PACK | Freq: Two times a day (BID) | ORAL | Status: AC

## 2011-11-12 MED ORDER — CITALOPRAM HYDROBROMIDE 40 MG PO TABS: 20.0000 mg | ORAL_TABLET | Freq: Every day | ORAL | Status: DC

## 2011-11-12 MED ORDER — GLIPIZIDE 5 MG PO TABS: 5.0000 mg | ORAL_TABLET | Freq: Two times a day (BID) | ORAL | Status: DC

## 2011-11-12 NOTE — Progress Notes (Signed)
Clinical Social Worker facilitated pt discharge needs including contacting facility, sending pt discharge information via [facility], discussing with pt and pt daughter, providing contact information to RN to call report and arranging ambulance transportation for pt to [facility]. No further social work needs identified at this time. Clinical Social Worker signing off.   Jacklynn Lewis, MSW, LCSWA  Clinical Social Work [phone]

## 2011-11-12 NOTE — Progress Notes (Signed)
Order received, chart reviewed, noted that patient is to D/C to SNF and even possibly today. Will defer OT eval to that facility. Acute OT will sign off.

## 2011-11-12 NOTE — Progress Notes (Signed)
[name], female   DOB: 03-20-1936, 76 y.o.   MRN: [id number] Subjective: 4 Days Post-Op Procedure(s) (LRB): ARTHROPLASTY BIPOLAR HIP (Left)    Patient reports pain as mild, at least while in bed.  Reports some activity over the weekend.  Objective:   VITALS:   Filed Vitals:   11/12/11 0555  BP: 123/75  Pulse: 68  Temp: 98.5 F (36.9 C)  Resp: 17    Neurovascular intact Incision: dressing C/D/I  LABS  Basename 11/11/11 0603 11/10/11 0714  HGB 10.2* 10.3*  HCT 30.1* 30.8*  WBC 13.6* 15.6*  PLT 186 166     Basename 11/11/11 0603 11/10/11 0714  NA 140 133*  K 3.4* 3.8  BUN 14 14  CREATININE 0.63 0.69  GLUCOSE 95 188*    No results found for this basename: LABPT:2,INR:2 in the last 72 hours   Assessment/Plan: 4 Days Post-Op Procedure(s) (LRB): ARTHROPLASTY BIPOLAR HIP (Left)   Up with therapy Discharge to SNF when bed available, wants [facility] stable  Dressing changes daily as needed until follow up WBAT LLE DVT prophylaxis for another 10 days Return to see [facility] in 2 weeks for wound check and general follow up May shower at any time just keep wound as dry as possible

## 2011-11-12 NOTE — Progress Notes (Signed)
[name] to be D/C'd Rehab per MD order.  Discussed with the patient the After Visit Summary and all questions fully answered. All IV's discontinued with no bleeding noted. All belongings returned to patient for patient to take home.   Last Vital Signs:  Blood pressure 123/75, pulse 68, temperature 98.5 F (36.9 C), temperature source Oral, resp. rate 17, height 5' 5.2" (1.656 m), weight 64.1 kg (141 lb 5 oz), SpO2 95.00%.  Susann Givens, RN, [facility] 11/12/2011 10:13 AM

## 2011-11-12 NOTE — Discharge Summary (Signed)
PATIENT DETAILS Name: Erin Vang Age: 76 y.o. Sex: female Date of Birth: 1935/09/30 MRN: [id number]. Admit Date: 11/07/2011 Admitting Physician: Eduard Clos, MD WUJ:[id number], Lenon Curt, MD  Recommendations for Outpatient Follow-up:  Dressing changes daily as needed until follow up  WBAT LLE May shower at any time just keep wound as dry as possible Needs repeat CBC in 1 week   PRIMARY DISCHARGE DIAGNOSIS:  Principal Problem:  *Closed left hip fracture Active Problems:  DIABETES MELLITUS, TYPE II  HYPERTENSION  COPD (chronic obstructive pulmonary disease)  Paroxysmal atrial fibrillation      PAST MEDICAL HISTORY: Past Medical History  Diagnosis Date  . Hypertension   . Diabetes mellitus   . Osteoarthritis   . Hypercholesterolemia   . Paroxysmal atrial fibrillation     CHADS2: 2; managed with rate control and anticoagulation.   . Syncope and collapse     When patient stands up real quickly  . Normal cardiac stress test     a.  lexiscan MV - EF 72%, No ischemia  . Anemia     ? of lab error vs GI bleed  . COPD (chronic obstructive pulmonary disease)   . Weight loss   . Depression   . Prolonged grief reaction   . Chronic anticoagulation     On Xarelto    DISCHARGE MEDICATIONS: Medication List  As of 11/12/2011  9:53 AM   TAKE these medications         acetaminophen 325 MG tablet   Commonly known as: TYLENOL   Take 2 tablets (650 mg total) by mouth every 6 (six) hours as needed (or Fever >/= 101).      CALCIUM 600 PO   Take 1 tablet by mouth daily.      citalopram 40 MG tablet   Commonly known as: CELEXA   Take 0.5 tablets (20 mg total) by mouth daily.      digoxin 0.25 MG tablet   Commonly known as: LANOXIN   Take 1 tablet (250 mcg total) by mouth daily.      DSS 100 MG Caps   Take 100 mg by mouth 2 (two) times daily.      ferrous sulfate 325 (65 FE) MG tablet   Take 1 tablet (325 mg total) by mouth 3 (three) times daily after meals.      Fish  Oil 1200 MG Caps   Take 1 capsule by mouth daily.      Flax Seed Oil 1000 MG Caps   Take 1,000 mg by mouth 3 (three) times daily.      glipiZIDE 5 MG tablet   Commonly known as: GLUCOTROL   Take 1 tablet (5 mg total) by mouth 2 (two) times daily before a meal.      HYDROcodone-acetaminophen 5-325 MG per tablet   Commonly known as: NORCO/VICODIN   Take 1-2 tablets by mouth every 6 (six) hours as needed.      losartan 50 MG tablet   Commonly known as: COZAAR   Take 25 mg by mouth daily.      METOPROLOL TARTRATE PO   Take 50 mg by mouth 2 (two) times daily.      MULTIVITAMIN PO   Take 1 tablet by mouth daily after breakfast.      polyethylene glycol packet   Commonly known as: MIRALAX / GLYCOLAX   Take 17 g by mouth 2 (two) times daily.      RA GLUCOSAMINE-CHONDROITIN 750-600 MG Tabs  Generic drug: Glucosamine-Chondroitin   Take 1 tablet by mouth daily.      Rivaroxaban 20 MG Tabs   Commonly known as: XARELTO   Take 20 mg by mouth daily.      simvastatin 40 MG tablet   Commonly known as: ZOCOR   Take 40 mg by mouth every evening. Take with dinner.      vitamin C 500 MG tablet   Commonly known as: ASCORBIC ACID   Take 500 mg by mouth daily.      VITAMIN D PO   Take 2,000 mg by mouth daily.             BRIEF HPI:  See H&P, Labs, Consult and Test reports for all details in brief, patient was admitted for a mechanical fall and a subsequent left hip fracture.  CONSULTATIONS:   cardiology and orthopedic surgery  PERTINENT RADIOLOGIC STUDIES: Dg Chest 1 View  11/07/2011  *RADIOLOGY REPORT*  Clinical Data: Left hip pain after fall.  CHEST - 1 VIEW  Comparison: 05/24/2011  Findings: Borderline heart size and pulmonary vascularity.  Mild pulmonary vascular congestion is not excluded.  Interstitial fibrosis and peribronchial thickening suggest chronic bronchitis. Emphysematous changes in the lungs.  No blunting of costophrenic angles.  No focal consolidation.  No  pneumothorax.  Calcification of the aorta.  IMPRESSION: Borderline heart size and pulmonary vascularity may represent early vascular congestion.  No edema.  Emphysema and fibrosis with chronic bronchitic changes.  Original Report Authenticated By: Marlon Pel, M.D.   Dg Hip Complete Left  11/07/2011  *RADIOLOGY REPORT*  Clinical Data: Left hip pain after fall.  LEFT HIP - COMPLETE 2+ VIEW  Comparison: None.  Findings: Acute transverse fracture of the left femoral neck with superior subluxation of the distal fracture fragment.  No dislocation of the acetabular joint.  No focal bone lesion is not demonstrated.  Visualized pelvis, sacrum, and right hip appear intact.  Degenerative changes in the lower lumbar spine and hips.  IMPRESSION: Mostly transverse fracture of the left femoral neck with superior subluxation of distal fracture fragment.  Original Report Authenticated By: Marlon Pel, M.D.   Dg Pelvis Portable  11/08/2011  *RADIOLOGY REPORT*  Clinical Data: Postop left hip.  PORTABLE PELVIS  Comparison: 11/07/2011  Findings: Interval resection of left femoral head with placement of left hemi arthroplasty using noncemented femoral component. Components appear well seated and there is no acetabular subluxation.  Surgical drain and soft tissue gas consistent with recent surgery.  Incidental note of degenerative changes in the right hip.  IMPRESSION: Interval left hip hemiarthroplasty.  Components appear well seated. No acute fracture or subluxation.  Original Report Authenticated By: Marlon Pel, M.D.     PERTINENT LAB RESULTS: CBC:  Basename 11/11/11 0603 11/10/11 0714  WBC 13.6* 15.6*  HGB 10.2* 10.3*  HCT 30.1* 30.8*  PLT 186 166   CMET CMP     Component Value Date/Time   NA 140 11/11/2011 0603   K 3.4* 11/11/2011 0603   CL 102 11/11/2011 0603   CO2 27 11/11/2011 0603   GLUCOSE 95 11/11/2011 0603   BUN 14 11/11/2011 0603   CREATININE 0.63 11/11/2011 0603   CALCIUM 8.6  11/11/2011 0603   PROT 6.0 11/08/2011 0649   ALBUMIN 3.2* 11/08/2011 0649   AST 170* 11/08/2011 0649   ALT 79* 11/08/2011 0649   ALKPHOS 73 11/08/2011 0649   BILITOT 0.5 11/08/2011 0649   GFRNONAA 86* 11/11/2011 0603   GFRAA >90  11/11/2011 0603    GFR Estimated Creatinine Clearance: 56 ml/min (by C-G formula based on Cr of 0.63). No results found for this basename: LIPASE:2,AMYLASE:2 in the last 72 hours No results found for this basename: CKTOTAL:3,CKMB:3,CKMBINDEX:3,TROPONINI:3 in the last 72 hours No components found with this basename: POCBNP:3 No results found for this basename: DDIMER:2 in the last 72 hours No results found for this basename: HGBA1C:2 in the last 72 hours No results found for this basename: CHOL:2,HDL:2,LDLCALC:2,TRIG:2,CHOLHDL:2,LDLDIRECT:2 in the last 72 hours No results found for this basename: TSH,T4TOTAL,FREET3,T3FREE,THYROIDAB in the last 72 hours No results found for this basename: VITAMINB12:2,FOLATE:2,FERRITIN:2,TIBC:2,IRON:2,RETICCTPCT:2 in the last 72 hours Coags: No results found for this basename: PT:2,INR:2 in the last 72 hours Microbiology: Recent Results (from the past 240 hour(s))  SURGICAL PCR SCREEN     Status: Normal   Collection Time   11/08/11  1:51 AM      Component Value Range Status Comment   MRSA, PCR NEGATIVE  NEGATIVE Final    Staphylococcus aureus NEGATIVE  NEGATIVE Final      BRIEF HOSPITAL COURSE:   Principal Problem:  *Closed left hip fracture -this was 2/2 to a mechanical fall -patient underwent Left hip hemiarthroplasty on 11/08/11 -post op course uncomplicated -seen by PT-deemed to need SNF -on Xarelto for afib prior to admit-this was continued after the Left hip hemiarthroplasty  -Dressing changes daily as needed until follow up  -WBAT LLE. -May shower at any time just keep wound as dry as possible  -Return to see Dr Charlann Boxer, [facility] in 2 weeks for wound check and general follow up  Active Problems: -Fall    -this was purely mechanical-no syncopal episode   Afib  -chronic issue  -continue with Metoprolol  -Digoxin was briefly held initially due to sinus pauses, this was restarted by cards on 7/20  -restarted Xarelto 7/19-after OK with ortho  -last Echo-05/25/11-EF 55  Sinus Pauses  -seen on telemerty - Digoxin initially held but again resumed  -monitored on telemetry, all pauses were <3 sec -cardiology did follow the patient during this admission-per Dr Harvie Bridge note-She has refused pacer in the past. -follow up with Primary Cardiologist in 1-2 weeks-Dr Brackbill  Leukocytosis  -likely reactive -non toxic appearance- -no foci of infection evident  -slowly downtrended down, no antibiotics were started -last WBC on 7/21-13.6  Anemia  -likely 2/2 to peri-operative blood loss -last Hb on 7/21 was 10.2 -will need to monitored periodically -continue with FeSO4  DIABETES MELLITUS, TYPE II  -CBG's stable  -was on SSI this admission -resume Glipizide on discharge  COPD (chronic obstructive pulmonary disease) -stable this admit  Dyslipidemia  -continue with Statin   Depression  -decrease Celexa to 20 mg  TODAY-DAY OF DISCHARGE:  Subjective:   Erin Vang today has no headache,no chest abdominal pain,no new weakness tingling or numbness, feels much better and is ambulating with a walked today morning  Objective:   Blood pressure 123/75, pulse 68, temperature 98.5 F (36.9 C), temperature source Oral, resp. rate 17, height 5' 5.2" (1.656 m), weight 64.1 kg (141 lb 5 oz), SpO2 95.00%.  Intake/Output Summary (Last 24 hours) at 11/12/11 0953 Last data filed at 11/11/11 1300  Gross per 24 hour  Intake    240 ml  Output      0 ml  Net    240 ml    Exam Awake Alert, Oriented *3, No new F.N deficits, Normal affect [place].AT,PERRAL Supple Neck,No JVD, No cervical lymphadenopathy appriciated.  Symmetrical Chest  wall movement, Good air movement bilaterally, CTAB RRR,No  Gallops,Rubs or new Murmurs, No Parasternal Heave +ve B.Sounds, Abd Soft, Non tender, No organomegaly appriciated, No rebound -guarding or rigidity. No Cyanosis, Clubbing or edema, No new Rash or bruise  DISCHARGE CONDITION: Stable  DISPOSITION: SNF  DISCHARGE INSTRUCTIONS:    Activity:  As tolerated with Full fall precautions use walker/cane & assistance as needed  Diet recommendation: Diabetic Diet Heart Healthy diet   Follow-up Information    Follow up with Shelda Pal, MD. Schedule an appointment as soon as possible for a visit in 2 weeks.   Contact information:   [facility] [address] [place] [address] [phone]       Follow up with GREEN, Lenon Curt, MD. Schedule an appointment as soon as possible for a visit in 1 week.   Contact information:   [address] [place] [address] [phone]            Total Time spent on discharge equals 45 minutes.  SignedJeoffrey Massed 11/12/2011 9:53 AM

## 2011-11-12 NOTE — Progress Notes (Signed)
Physical Therapy Treatment Patient Details Name: Erin Vang MRN: [id number] DOB: [birth year] Today's Date: 11/12/2011 Time: 4696-2952 PT Time Calculation (min): 25 min  PT Assessment / Plan / Recommendation Comments on Treatment Session  Pt s/p left hip hemiarthroplasty with improved mobility. Limited by HR up to 159 with ambulation, notified by monitor tech and returned pt to sitting with decrease in HR. Pt educated for all precautions and encouraged to continue HEP after resting. Pt without report of symptoms with ambulation but when questioned at rest she stated she did feel a little funny and educated for need to attend to those feelings and cease activity accordingly.     Follow Up Recommendations       Barriers to Discharge        Equipment Recommendations       Recommendations for Other Services    Frequency     Plan Discharge plan remains appropriate;Frequency remains appropriate    Precautions / Restrictions Precautions Precautions: Fall;Posterior Hip Precaution Comments: Pt able to recall 2/3 precautions and educated for all 3 Restrictions LLE Weight Bearing: Weight bearing as tolerated   Pertinent Vitals/Pain No pain     Mobility  Bed Mobility Bed Mobility: Supine to Sit;Sitting - Scoot to Edge of Bed Supine to Sit: 3: Mod assist;With rails Sitting - Scoot to Edge of Bed: 3: Mod assist Details for Bed Mobility Assistance: mod assist with cueing for scooting pelvis and pivoting with assist to pivot legs and fully elevate trunk off surface with use of rail Transfers Transfers: Sit to Stand;Stand to Sit Sit to Stand: 4: Min assist;From bed Stand to Sit: 4: Min assist;To chair/3-in-1;With armrests Details for Transfer Assistance: cueing for hand placement and LLE positioning with hand over hand assist to reach for armrests with descent Ambulation/Gait Ambulation/Gait Assistance: 4: Min assist Ambulation Distance (Feet): 70 Feet Assistive device: Rolling  walker Ambulation/Gait Assistance Details: cueing for sequence, posture, and to maintain toes in forward position with assist to turn RW and increased cueing with turning to maintain precautions Gait Pattern: Step-to pattern;Trunk flexed Stairs: No    Exercises General Exercises - Lower Extremity Ankle Circles/Pumps: AROM;Both;15 reps;Seated Quad Sets: AROM;10 reps;Left;Seated Hip ABduction/ADduction: AAROM;Left;10 reps;Seated   PT Diagnosis:    PT Problem List:   PT Treatment Interventions:     PT Goals Acute Rehab PT Goals PT Goal: Supine/Side to Sit - Progress: Progressing toward goal PT Goal: Sit to Stand - Progress: Progressing toward goal PT Goal: Stand to Sit - Progress: Progressing toward goal PT Goal: Ambulate - Progress: Progressing toward goal PT Goal: Perform Home Exercise Program - Progress: Progressing toward goal Additional Goals PT Goal: Additional Goal #1 - Progress: Progressing toward goal  Visit Information  Last PT Received On: 11/12/11 Assistance Needed: +1    Subjective Data  Subjective: I guess I'm ready   Cognition  Overall Cognitive Status: Appears within functional limits for tasks assessed/performed Arousal/Alertness: Awake/alert Orientation Level: Appears intact for tasks assessed Behavior During Session: [facility] for tasks performed    Balance     End of Session PT - End of Session Equipment Utilized During Treatment: Gait belt Activity Tolerance: Treatment limited secondary to medical complications (Comment) Patient left: in chair;with call bell/phone within reach Nurse Communication: Mobility status   GP     Delorse Lek 11/12/2011, 8:34 AM Delaney Meigs, PT [phone]

## 2011-12-19 ENCOUNTER — Encounter: Payer: Self-pay | Admitting: Cardiology

## 2011-12-19 ENCOUNTER — Ambulatory Visit (INDEPENDENT_AMBULATORY_CARE_PROVIDER_SITE_OTHER): Payer: Medicare Other | Admitting: Cardiology

## 2011-12-19 VITALS — BP 110/58 | HR 45 | Ht 65.0 in | Wt 131.0 lb

## 2011-12-19 DIAGNOSIS — F329 Major depressive disorder, single episode, unspecified: Secondary | ICD-10-CM

## 2011-12-19 DIAGNOSIS — F341 Dysthymic disorder: Secondary | ICD-10-CM

## 2011-12-19 DIAGNOSIS — I4891 Unspecified atrial fibrillation: Secondary | ICD-10-CM

## 2011-12-19 NOTE — Assessment & Plan Note (Signed)
At the present time her heart rate is too slow.  She has atrial fibrillation very slow ventricular response.  Heart rate is 45 today.  She is on both beta blocker and digoxin.  We will stop her digoxin and allow her to have a faster heart rate.  She has some element of tachybradycardia syndrome.  She may need a permanent pacemaker to control her symptoms.  We will see how she is doing at her next visit and then discuss this possibility further.

## 2011-12-19 NOTE — Progress Notes (Signed)
Erin Vang Date of Birth:  [birth year] [facility] [address] [place], [place]  [address] [phone]         Fax   [phone]  History of Present Illness: This pleasant 76 year old woman is seen for a scheduled followup office visit. She has a history of established atrial fibrillation. Her fibrillation is well tolerated and rate controlled. He is not having any symptoms of congestive heart failure. She is on long-term Xarelto for anticoagulation. The patient also has a history of COPD and a history of situational depression. Depression occurred after the death of her husband last year. The patient's depression has improved since Dr. [name] put her on low-dose Celexa.  The patient continues to complain of lack of energy.  She has difficulty with ongoing weight loss and has lost another 6 pounds and has a poor appetite.  She is still having some problems with depression.  She lost her balance and fell and broke her left hip on 11/08/2011 and Dr. Charlann Boxer performed to hip surgery.  She subsequently went to  rehabilitation.   Current Outpatient Prescriptions  Medication Sig Dispense Refill  . acetaminophen (TYLENOL) 325 MG tablet Take 2 tablets (650 mg total) by mouth every 6 (six) hours as needed (or Fever >/= 101).      . Calcium Carbonate (CALCIUM 600 PO) Take 1 tablet by mouth daily.       . Cholecalciferol (VITAMIN D PO) Take 2,000 mg by mouth daily.        . citalopram (CELEXA) 40 MG tablet Take 40 mg by mouth daily.      . ferrous sulfate 325 (65 FE) MG tablet Take 325 mg by mouth daily with breakfast.      . Flaxseed, Linseed, (FLAX SEED OIL) 1000 MG CAPS Take 1,000 mg by mouth 3 (three) times daily.       Marland Kitchen glipiZIDE (GLUCOTROL) 5 MG tablet Take 1 tablet (5 mg total) by mouth 2 (two) times daily before a meal.      . Glucosamine-Chondroitin (RA GLUCOSAMINE-CHONDROITIN) 750-600 MG TABS Take 1 tablet by mouth daily.       Marland Kitchen losartan (COZAAR) 50 MG tablet Take 25 mg  by mouth daily.       Marland Kitchen METOPROLOL TARTRATE PO Take 50 mg by mouth as directed. Taking 1/2 bid      . Multiple Vitamin (MULTIVITAMIN PO) Take 1 tablet by mouth daily after breakfast.       . Omega-3 Fatty Acids (FISH OIL) 1200 MG CAPS Take 1 capsule by mouth daily.       . Rivaroxaban (XARELTO) 20 MG TABS Take 20 mg by mouth daily.  90 tablet  3  . vitamin C (ASCORBIC ACID) 500 MG tablet Take 500 mg by mouth daily.        Marland Kitchen DISCONTD: citalopram (CELEXA) 40 MG tablet Take 0.5 tablets (20 mg total) by mouth daily.        Allergies  Allergen Reactions  . Erythromycin Hives  . Nitrofurantoin Other (See Comments)    Unknown   . Nitrofurantoin Monohyd Macro Other (See Comments)    Unknown   . Wool Alcohol (Lanolin Alcohol) Hives  . Neomycin-Bacitracin Zn-Polymyx Rash  . Penicillins Hives and Rash  . Sulfonamide Derivatives Hives and Rash    Patient Active Problem List  Diagnosis  . DIABETES MELLITUS, TYPE II  . ANXIETY  . HYPERTENSION  . Atrial fibrillation with RVR  . ALLERGIC RHINITIS  . DYSPNEA  .  Osteoarthritis  . Hypercholesterolemia  . Benign hypertensive heart disease without heart failure  . COPD (chronic obstructive pulmonary disease)  . Reactive depression  . Anorexia  . Anemia  . Weight loss, non-intentional  . Bradycardia  . Normal cardiac stress test  . Paroxysmal atrial fibrillation  . Constipation, chronic  . Closed left hip fracture    History  Smoking status  . Former Smoker -- 2.0 packs/day for 35 years  . Types: Cigarettes  . Quit date: 04/23/1986  Smokeless tobacco  . Never Used    History  Alcohol Use No    Family History  Problem Relation Age of Onset  . Heart failure Mother     Died 82  . Cancer Father     Died 59    Review of Systems: Constitutional: no fever chills diaphoresis or fatigue or change in weight.  Head and neck: no hearing loss, no epistaxis, no photophobia or visual disturbance. Respiratory: No cough, shortness of  breath or wheezing. Cardiovascular: No chest pain peripheral edema, palpitations. Gastrointestinal: No abdominal distention, no abdominal pain, no change in bowel habits hematochezia or melena. Genitourinary: No dysuria, no frequency, no urgency, no nocturia. Musculoskeletal:No arthralgias, no back pain, no gait disturbance or myalgias. Neurological: No dizziness, no headaches, no numbness, no seizures, no syncope, no weakness, no tremors. Hematologic: No lymphadenopathy, no easy bruising. Psychiatric: No confusion, no hallucinations, no sleep disturbance.    Physical Exam: Filed Vitals:   12/19/11 1418  BP: 110/58  Pulse: 45   the general appearance reveals a slightly pale elderly woman who appears depressed.The head and neck exam reveals pupils equal and reactive.  Extraocular movements are full.  There is no scleral icterus.  The mouth and pharynx are normal.  The neck is supple.  The carotids reveal no bruits.  The jugular venous pressure is normal.  The  thyroid is not enlarged.  There is no lymphadenopathy.  The chest is clear to percussion and auscultation.  There are no rales or rhonchi.  Expansion of the chest is symmetrical.  The precordium is quiet.  The first heart sound is normal.  The second heart sound is physiologically split.  There is no murmur gallop rub or click.  There is no abnormal lift or heave.  The abdomen is soft and nontender.  The bowel sounds are normal.  The liver and spleen are not enlarged.  There are no abdominal masses.  There are no abdominal bruits.  Extremities reveal good pedal pulses.  There is no phlebitis or edema.  There is no cyanosis or clubbing.  Strength is normal and symmetrical in all extremities.  There is no lateralizing weakness.  There are no sensory deficits.  The skin is warm and dry.  There is no rash.    Assessment / Plan: Stop digoxin altogether at this point.  She was taking 0.25 mg daily.  This may also help her appetite.  Recheck in  one month for followup office visit EKG CBC

## 2011-12-19 NOTE — Assessment & Plan Note (Signed)
The patient remains very depressed over her husband's death although she has improved somewhat since starting on Celexa

## 2011-12-19 NOTE — Patient Instructions (Signed)
Stop digoxin  Restart OTC iron (ferrous sulfate)  Your physician recommends that you schedule a follow-up appointment in: 1 month ov / ekg / cbc

## 2012-01-16 ENCOUNTER — Other Ambulatory Visit (INDEPENDENT_AMBULATORY_CARE_PROVIDER_SITE_OTHER): Payer: Medicare Other

## 2012-01-16 ENCOUNTER — Encounter: Payer: Self-pay | Admitting: Nurse Practitioner

## 2012-01-16 ENCOUNTER — Ambulatory Visit (INDEPENDENT_AMBULATORY_CARE_PROVIDER_SITE_OTHER): Payer: Medicare Other | Admitting: Nurse Practitioner

## 2012-01-16 VITALS — BP 90/60 | HR 57 | Ht 65.0 in | Wt 136.8 lb

## 2012-01-16 DIAGNOSIS — Z7901 Long term (current) use of anticoagulants: Secondary | ICD-10-CM

## 2012-01-16 DIAGNOSIS — I480 Paroxysmal atrial fibrillation: Secondary | ICD-10-CM

## 2012-01-16 DIAGNOSIS — I4891 Unspecified atrial fibrillation: Secondary | ICD-10-CM

## 2012-01-16 DIAGNOSIS — R0989 Other specified symptoms and signs involving the circulatory and respiratory systems: Secondary | ICD-10-CM

## 2012-01-16 LAB — BASIC METABOLIC PANEL
BUN: 24 mg/dL — ABNORMAL HIGH (ref 6–23)
CO2: 29 mEq/L (ref 19–32)
Calcium: 9.2 mg/dL (ref 8.4–10.5)
Chloride: 101 mEq/L (ref 96–112)
Creatinine, Ser: 0.8 mg/dL (ref 0.4–1.2)
GFR: 80.18 mL/min (ref >60.00)
Glucose, Bld: 219 mg/dL — ABNORMAL HIGH (ref 70–99)
Potassium: 4.4 mEq/L (ref 3.5–5.1)
Sodium: 138 mEq/L (ref 135–145)

## 2012-01-16 LAB — CBC WITH DIFFERENTIAL/PLATELET
Basophils Absolute: 0.1 10*3/uL (ref 0.0–0.1)
Basophils Relative: 1 % (ref 0.0–3.0)
Eosinophils Absolute: 0.3 10*3/uL (ref 0.0–0.7)
Eosinophils Relative: 2.9 % (ref 0.0–5.0)
HCT: 41.2 % (ref 36.0–46.0)
Hemoglobin: 13.3 g/dL (ref 12.0–15.0)
Lymphocytes Relative: 22 % (ref 12.0–46.0)
Lymphs Abs: 2.1 10*3/uL (ref 0.7–4.0)
MCHC: 32.3 g/dL (ref 30.0–36.0)
MCV: 89.5 fl (ref 78.0–100.0)
Monocytes Absolute: 0.9 10*3/uL (ref 0.1–1.0)
Monocytes Relative: 9.9 % (ref 3.0–12.0)
Neutro Abs: 6.2 10*3/uL (ref 1.4–7.7)
Neutrophils Relative %: 64.2 % (ref 43.0–77.0)
Platelets: 268 10*3/uL (ref 150.0–400.0)
RBC: 4.6 Mil/uL (ref 3.87–5.11)
RDW: 13.8 % (ref 11.5–14.6)
WBC: 9.6 10*3/uL (ref 4.5–10.5)

## 2012-01-16 NOTE — Progress Notes (Addendum)
[name] Date of Birth: 04/03/1936 Medical Record #[id number]  History of Present Illness: Erin Vang is seen back today for a one month check. She is seen for Dr. Patty Sermons. She has chronic atrial fib and is on Xarelto. She also has COPD. She has been terribly depressed since the death of her husband. She is now on SSRI therapy. She has had ongoing weight loss. She fell back in July and broke her left hip and had to have hip surgery. She was seen last month and noted to be too bradycardic. Digoxin was stopped.  She comes in today. She is here with a friend. She is not doing well. She still feels weak and tired. Not crying today and does not seem as depressed as I have seen her in the past. Still not sleeping well. She continues to recover from her hip surgery. She says the therapist says her heart beats too slow. She misunderstood her instructions at her last visit. She stopped her Losartan and NOT her Lanoxin. She says she saw Dr. Graciela Husbands after her hip surgery. He recommended a pacemaker to her then but she refused. She thinks she may be ready now. She has had documented atrial fib with RVR. Has pauses as well. Did have an episode of syncope prior to her fall and hip fracture.  No chest pain.   Current Outpatient Prescriptions on File Prior to Visit  Medication Sig Dispense Refill  . Calcium Carbonate (CALCIUM 600 PO) Take 1 tablet by mouth daily.       . Cholecalciferol (VITAMIN D PO) Take 2,000 mg by mouth daily.        . citalopram (CELEXA) 40 MG tablet Take 40 mg by mouth daily.      . Flaxseed, Linseed, (FLAX SEED OIL) 1000 MG CAPS Take 1,000 mg by mouth 3 (three) times daily.       . Glucosamine-Chondroitin (RA GLUCOSAMINE-CHONDROITIN) 750-600 MG TABS Take 1 tablet by mouth daily.       Marland Kitchen METOPROLOL TARTRATE PO Take 50 mg by mouth as directed. Taking 1/2 bid      . Multiple Vitamin (MULTIVITAMIN PO) Take 1 tablet by mouth daily after breakfast.       . Omega-3 Fatty Acids (FISH OIL) 1200 MG  CAPS Take 1 capsule by mouth daily.       . Rivaroxaban (XARELTO) 20 MG TABS Take 20 mg by mouth daily.  90 tablet  3  . vitamin C (ASCORBIC ACID) 500 MG tablet Take 500 mg by mouth daily.        . ferrous sulfate 325 (65 FE) MG tablet Take 325 mg by mouth daily with breakfast.        Allergies  Allergen Reactions  . Erythromycin Hives  . Nitrofurantoin Other (See Comments)    Unknown   . Nitrofurantoin Monohyd Macro Other (See Comments)    Unknown   . Wool Alcohol (Lanolin Alcohol) Hives  . Neomycin-Bacitracin Zn-Polymyx Rash  . Penicillins Hives and Rash  . Sulfonamide Derivatives Hives and Rash    Past Medical History  Diagnosis Date  . Hypertension   . Diabetes mellitus   . Osteoarthritis   . Hypercholesterolemia   . Paroxysmal atrial fibrillation     CHADS2: 2; managed with rate control and anticoagulation.   . Syncope and collapse     When patient stands up real quickly  . Normal cardiac stress test     a.  lexiscan MV - EF 72%, No ischemia  .  Anemia     ? of lab error vs GI bleed  . COPD (chronic obstructive pulmonary disease)   . Weight loss   . Depression   . Prolonged grief reaction   . Chronic anticoagulation     On Xarelto    Past Surgical History  Procedure Date  . Gallbladder surgery   . Appendectomy     age 76  . Cervical spine surgery jan 2011  . Hip arthroplasty 11/08/2011    Procedure: ARTHROPLASTY BIPOLAR HIP;  Surgeon: Shelda Pal, MD;  Location: [facility] OR;  Service: Orthopedics;  Laterality: Left;    History  Smoking status  . Former Smoker -- 2.0 packs/day for 35 years  . Types: Cigarettes  . Quit date: 04/23/1986  Smokeless tobacco  . Never Used    History  Alcohol Use No    Family History  Problem Relation Age of Onset  . Heart failure Mother     Died 63  . Cancer Father     Died 5    Review of Systems: The review of systems is per the HPI.  All other systems were reviewed and are negative.  Physical Exam: BP 90/60   Pulse 57  Ht 5\' 5"  (1.651 m)  Wt 136 lb 12.8 oz (62.052 kg)  BMI 22.76 kg/m2 Patient is very pleasant and in no acute distress. Skin is warm and dry. Color is normal.  HEENT is unremarkable. Normocephalic/atraumatic. PERRL. Sclera are nonicteric. Neck is supple. No masses. No JVD. Lungs are clear. Cardiac exam shows a regular rate and rhythm. Abdomen is soft. Extremities are without edema. Gait and ROM are intact. No gross neurologic deficits noted.   LABORATORY DATA: BMET and CBC are pending for today.  Lab Results  Component Value Date   WBC 13.6* 11/11/2011   HGB 10.2* 11/11/2011   HCT 30.1* 11/11/2011   PLT 186 11/11/2011   GLUCOSE 95 11/11/2011   ALT 79* 11/08/2011   AST 170* 11/08/2011   NA 140 11/11/2011   K 3.4* 11/11/2011   CL 102 11/11/2011   CREATININE 0.63 11/11/2011   BUN 14 11/11/2011   CO2 27 11/11/2011   TSH 4.132 05/25/2011   INR 0.91 05/24/2011   HGBA1C 6.3* 05/24/2011     Assessment / Plan: 1. Atrial fib - she has tachy brady syndrome as well. She did not stop her Digoxin. She has had pauses even when on Digoxin from her past event monitor. She has had evidence of RVR as well. She says she is agreeable to seeing Dr. Graciela Husbands and proceeding on with PTVP. Her EKG today shows atrial fib with a slow VR but a rate of 56 today. I have talked with Dr. Patty Sermons and he is in agreement.   2. Grief - she actually seems a little brighter to me. Weight is up a little. I have encouraged her to stay on the Celexa.   3. HTN - repeat blood pressure by me is low. I have asked her to monitor at home.  We will get her a time with Dr. Graciela Husbands. Recheck her labs today. Patient is agreeable to this plan and will call if any problems develop in the interim.

## 2012-01-16 NOTE — Patient Instructions (Addendum)
Stay off the Losartan  Stay on your other medicines  Check some blood pressures for me at home  We will get you an appointment with Dr. Graciela Husbands for pacemaker implant  Call the [facility] office at [phone] if you have any questions, problems or concerns.

## 2012-01-21 ENCOUNTER — Telehealth: Payer: Self-pay | Admitting: *Deleted

## 2012-01-21 NOTE — Telephone Encounter (Signed)
Message copied by Burnell Blanks on Mon Jan 21, 2012  3:37 PM ------      Message from: Rosalio Macadamia      Created: Wed Jan 16, 2012  4:28 PM       Ok to report. Labs are satisfactory but sugar is high. Would follow up with Dr. [name] regarding labs. Please send him a copy of labs and my note.

## 2012-01-21 NOTE — Telephone Encounter (Signed)
Advised patient of lab results and sent to PCP 

## 2012-01-31 ENCOUNTER — Ambulatory Visit (INDEPENDENT_AMBULATORY_CARE_PROVIDER_SITE_OTHER): Payer: Medicare Other | Admitting: Internal Medicine

## 2012-01-31 ENCOUNTER — Encounter: Payer: Self-pay | Admitting: Internal Medicine

## 2012-01-31 VITALS — BP 132/72 | HR 43 | Ht 65.0 in | Wt 133.2 lb

## 2012-01-31 DIAGNOSIS — I4891 Unspecified atrial fibrillation: Secondary | ICD-10-CM

## 2012-01-31 NOTE — Patient Instructions (Addendum)
Your physician has recommended that you wear a 48 hour holter monitor. Holter monitors are medical devices that record the heart's electrical activity. Doctors most often use these monitors to diagnose arrhythmias. Arrhythmias are problems with the speed or rhythm of the heartbeat. The monitor is a small, portable device. You can wear one while you do your normal daily activities. This is usually used to diagnose what is causing palpitations/syncope (passing out).  Your physician recommends that you schedule a follow-up appointment in: 3 weeks with Dr. Graciela Husbands.

## 2012-01-31 NOTE — Progress Notes (Signed)
ELECTROPHYSIOLOGY CONSULT NOTE  [name], MRN: [id number], DOB/AGE: Jul 03, 1935 76 y.o. Admit date: (Not on file) Date of Consult: 01/31/2012  Primary Physician: Kimber Relic, MD Primary Cardiologist: TB Chief Complaint: ?pacer   HPI Erin Vang is a 76 y.o. female referred for consideration of pacemaker implantation.  She has a history of permanent atrial fibrillation. She has been hospitalized a couple of times in the last year for various issues including recent hip surgery. She has been noted to be quite bradycardic at times as well as tachycardic. An event recorder in February demonstrated heart rates in the 14-160 range. An EKG in August included heart rates of 30-35;  when she was seen in August and they discontinued her digoxen; she unfortunately he misunderstood the instructions and discontinued her losartan instead. When she was seen in her heart rate was 57.  I apparently had known the family from taking care of her husband a one occasion for Dr. Ladona Ridgel. She had remembered a tall man in the hospital told her she needed a pacemaker. She talked about with me. It turns out it was probably either Hochrein or Brion Aliment  She has had one episode of syncope. This occurred in church today after her husband died. It was preceded by nausea flushing diaphoresis. She does not have presyncope. She's had no tachycardia palpitations. She is referred now because she was "agreeable to a pacemaker". She does not remember having that in the hospital.    Past Medical History  Diagnosis Date  . Hypertension   . Diabetes mellitus   . Osteoarthritis   . Hypercholesterolemia   . Paroxysmal atrial fibrillation     CHADS2: 2; managed with rate control and anticoagulation.   . Syncope and collapse     When patient stands up real quickly  . Normal cardiac stress test     a.  lexiscan MV - EF 72%, No ischemia  . Anemia     ? of lab error vs GI bleed  . COPD (chronic obstructive  pulmonary disease)   . Weight loss   . Depression   . Prolonged grief reaction   . Chronic anticoagulation     On Xarelto      Surgical History:  Past Surgical History  Procedure Date  . Gallbladder surgery   . Appendectomy     age 11  . Cervical spine surgery jan 2011  . Hip arthroplasty 11/08/2011    Procedure: ARTHROPLASTY BIPOLAR HIP;  Surgeon: Shelda Pal, MD;  Location: [facility] OR;  Service: Orthopedics;  Laterality: Left;     Home Meds: Prior to Admission medications   Medication Sig Start Date End Date Taking? Authorizing Provider  Calcium Carbonate (CALCIUM 600 PO) Take 1 tablet by mouth daily.    Yes Historical Provider, MD  Cholecalciferol (VITAMIN D PO) Take 2,000 mg by mouth daily.     Yes Historical Provider, MD  citalopram (CELEXA) 40 MG tablet Take 40 mg by mouth daily. 11/12/11  Yes Shanker Levora Dredge, MD  digoxin (LANOXIN) 0.25 MG tablet Take 0.25 mg by mouth daily.   Yes Historical Provider, MD  ferrous sulfate 325 (65 FE) MG tablet Take 325 mg by mouth daily with breakfast.   Yes Historical Provider, MD  Flaxseed, Linseed, (FLAX SEED OIL) 1000 MG CAPS Take 1,000 mg by mouth 3 (three) times daily.    Yes Historical Provider, MD  glipiZIDE (GLUCOTROL) 5 MG tablet Take 10 mg by mouth daily.  11/12/11  Yes Maretta Bees, MD  Glucosamine-Chondroitin (RA GLUCOSAMINE-CHONDROITIN) 750-600 MG TABS Take 1 tablet by mouth daily.    Yes Historical Provider, MD  METOPROLOL TARTRATE PO Take 50 mg by mouth as directed. Taking 1/2 bid   Yes Historical Provider, MD  Multiple Vitamin (MULTIVITAMIN PO) Take 1 tablet by mouth daily after breakfast.    Yes Historical Provider, MD  Omega-3 Fatty Acids (FISH OIL) 1200 MG CAPS Take 1 capsule by mouth daily.    Yes Historical Provider, MD  oxyCODONE-acetaminophen (PERCOCET/ROXICET) 5-325 MG per tablet Take 1 tablet by mouth every 4 (four) hours as needed.   Yes Historical Provider, MD  Rivaroxaban (XARELTO) 20 MG TABS Take 20 mg by mouth  daily. 07/16/11  Yes Rosalio Macadamia, NP  vitamin C (ASCORBIC ACID) 500 MG tablet Take 500 mg by mouth daily.     Yes Historical Provider, MD      Allergies:  Allergies  Allergen Reactions  . Erythromycin Hives  . Nitrofurantoin Other (See Comments)    Unknown   . Nitrofurantoin Monohyd Macro Other (See Comments)    Unknown   . Wool Alcohol (Lanolin Alcohol) Hives  . Neomycin-Bacitracin Zn-Polymyx Rash  . Penicillins Hives and Rash  . Sulfonamide Derivatives Hives and Rash    History   Social History  . Marital Status: Married    Spouse Name: Erin Vang    Number of Children: Erin Vang  . Years of Education: Erin Vang   Occupational History  . Not on file.   Social History Main Topics  . Smoking status: Former Smoker -- 2.0 packs/day for 35 years    Types: Cigarettes    Quit date: 04/23/1986  . Smokeless tobacco: Never Used  . Alcohol Use: No  . Drug Use: No  . Sexually Active: Not Currently   Other Topics Concern  . Not on file   Social History Narrative   Widowed in 2012.  Lives alone in [place].  Independent @ home.  Has been struggling with loss of husband.  Dtr near by.  Exercises in pulmonary rehab 2x/wk.     Family History  Problem Relation Age of Onset  . Heart failure Mother     Died 10  . Cancer Father     Died 62     ROS:  Please see the history of present illness.     All other systems reviewed and negative.    Physical Exam:  Blood pressure 132/72, pulse 43, height 5\' 5"  (1.651 m), weight 133 lb 3.2 oz (60.419 kg), SpO2 98.00%. General: Well developed, well nourished female in no acute distress. Head: Normocephalic, atraumatic, sclera non-icteric, no xanthomas, nares are without discharge. Lymph Nodes:  none Back: without scoliosis/kyphosis, no CVA tendersness Neck: Negative for carotid bruits. JVD not elevated. Lungs: Clear bilaterally to auscultation without wheezes, rales, or rhonchi. Breathing is unlabored. Heart: Irregularly irregular with S1 S2. No murmur  , rubs, or gallops appreciated. Abdomen: Soft, non-tender, non-distended with normoactive bowel sounds. No hepatomegaly. No rebound/guarding. No obvious abdominal masses. Msk:  Strength and tone appear normal for age. Extremities: No clubbing or cyanosis. No edema.  Distal pedal pulses are 2+ and equal bilaterally. Skin: Warm and Dry Neuro: Alert and oriented X 3. CN III-XII intact Grossly normal sensory and motor function . Psych:  Responds to questions appropriately with a normal affect. She is sad tearful     Labs: Cardiac Enzymes No results found for this basename: CKTOTAL:4,CKMB:4,TROPONINI:4 in the last 72 hours CBC Lab Results  Component Value Date   WBC 9.6 01/16/2012   HGB 13.3 01/16/2012   HCT 41.2 01/16/2012   MCV 89.5 01/16/2012   PLT 268.0 01/16/2012   PROTIME: No results found for this basename: LABPROT:3,INR:3 in the last 72 hours Chemistry No results found for this basename: NA,K,CL,CO2,BUN,CREATININE,CALCIUM,LABALBU,PROT,BILITOT,ALKPHOS,ALT,AST,GLUCOSE in the last 168 hours Lipids No results found for this basename: CHOL, HDL, LDLCALC, TRIG   BNP Pro B Natriuretic peptide (BNP)  Date/Time Value Range Status  07/26/2009 10:24 AM 117.0* 0.0-100.0 pg/mL Final   Miscellaneous No results found for this basename: DDIMER    Radiology/Studies:  No results found.  EKG:  2 weeks ago demonstrated atrial fibrillation at a rate of 57  Assessment and Plan:  Sherryl Manges

## 2012-01-31 NOTE — Assessment & Plan Note (Signed)
The patient has permanent atrial fibrillation. There have been available heart rates recorded with rates in the 30-160. It is not clear what is going on currently has her medications have been adjusted variably over time. I'm not sure if she has symptoms at this point yet and of themselves to justify pacing. After lengthy discussion we have elected to undertake a 48 hour Holter monitor to try and understand excursion of her heart rate. We will do it  on her current medication

## 2012-02-08 ENCOUNTER — Encounter (INDEPENDENT_AMBULATORY_CARE_PROVIDER_SITE_OTHER): Payer: Medicare Other

## 2012-02-08 DIAGNOSIS — I4891 Unspecified atrial fibrillation: Secondary | ICD-10-CM

## 2012-02-22 ENCOUNTER — Ambulatory Visit (INDEPENDENT_AMBULATORY_CARE_PROVIDER_SITE_OTHER): Payer: Medicare Other | Admitting: Internal Medicine

## 2012-02-22 ENCOUNTER — Encounter: Payer: Self-pay | Admitting: Internal Medicine

## 2012-02-22 ENCOUNTER — Encounter: Payer: Self-pay | Admitting: *Deleted

## 2012-02-22 VITALS — BP 122/58 | HR 70 | Ht 65.0 in | Wt 132.0 lb

## 2012-02-22 DIAGNOSIS — I4891 Unspecified atrial fibrillation: Secondary | ICD-10-CM

## 2012-02-22 DIAGNOSIS — R55 Syncope and collapse: Secondary | ICD-10-CM

## 2012-02-22 LAB — BASIC METABOLIC PANEL
CO2: 25 mEq/L (ref 19–32)
Calcium: 9.2 mg/dL (ref 8.4–10.5)
Chloride: 103 mEq/L (ref 96–112)
Creatinine, Ser: 0.9 mg/dL (ref 0.4–1.2)
Sodium: 137 mEq/L (ref 135–145)

## 2012-02-22 LAB — CBC WITH DIFFERENTIAL/PLATELET
Basophils Relative: 0.6 % (ref 0.0–3.0)
Eosinophils Absolute: 0.2 10*3/uL (ref 0.0–0.7)
Eosinophils Relative: 1.8 % (ref 0.0–5.0)
Hemoglobin: 13.6 g/dL (ref 12.0–15.0)
Lymphocytes Relative: 15.4 % (ref 12.0–46.0)
MCHC: 32 g/dL (ref 30.0–36.0)
MCV: 89.4 fl (ref 78.0–100.0)
Neutro Abs: 7.5 10*3/uL (ref 1.4–7.7)
Neutrophils Relative %: 72.1 % (ref 43.0–77.0)
RBC: 4.74 Mil/uL (ref 3.87–5.11)
WBC: 10.5 10*3/uL (ref 4.5–10.5)

## 2012-02-22 LAB — APTT: aPTT: 32.4 s — ABNORMAL HIGH (ref 21.7–28.8)

## 2012-02-22 LAB — PROTIME-INR: INR: 1.7 ratio — ABNORMAL HIGH (ref 0.8–1.0)

## 2012-02-22 NOTE — Progress Notes (Signed)
Quick Note:  Please report to patient. The recent labs are stable. Continue same medication and careful diet. No anemia. ______ 

## 2012-02-22 NOTE — Progress Notes (Signed)
. Patient Care Team: Kimber Relic, MD as PCP - General (Internal Medicine) Cassell Clement, MD as Referring Physician (Cardiology)   HPI  Erin Vang is a 76 y.o. female Seen in followup for a visit about 3 weeks ago at which time the issue was raised as to whether she would benefit from pacing in the context of permanent atrial fibrillation with demonstrated heart rates running from 30-160 but relation to her current medication regime is not clear She does have a history of syncope She was to wear a Holter monitor  She has had intercurrent syncope. The Holter monitor demonstrates rates into the 20s pauses of more than 4 seconds as well as heart rates in the 150s. There continues to be problems with fatigue and lightheadedness  Past Medical History  Diagnosis Date  . Hypertension   . Diabetes mellitus   . Osteoarthritis   . Hypercholesterolemia   . Paroxysmal atrial fibrillation     CHADS2: 2; managed with rate control and anticoagulation.   . Syncope and collapse     When patient stands up real quickly  . Normal cardiac stress test     a.  lexiscan MV - EF 72%, No ischemia  . Anemia     ? of lab error vs GI bleed  . COPD (chronic obstructive pulmonary disease)   . Weight loss   . Depression   . Prolonged grief reaction   . Chronic anticoagulation     On Xarelto    Past Surgical History  Procedure Date  . Gallbladder surgery   . Appendectomy     age 56  . Cervical spine surgery jan 2011  . Hip arthroplasty 11/08/2011    Procedure: ARTHROPLASTY BIPOLAR HIP;  Surgeon: Shelda Pal, MD;  Location: [facility] OR;  Service: Orthopedics;  Laterality: Left;    Current Outpatient Prescriptions  Medication Sig Dispense Refill  . Calcium Carbonate (CALCIUM 600 PO) Take 1 tablet by mouth daily.       . Cholecalciferol (VITAMIN D PO) Take 2,000 mg by mouth daily.        . citalopram (CELEXA) 40 MG tablet Take 40 mg by mouth daily.      . digoxin (LANOXIN) 0.25 MG tablet Take 0.25  mg by mouth daily.      . ferrous sulfate 325 (65 FE) MG tablet Take 325 mg by mouth daily with breakfast.      . Flaxseed, Linseed, (FLAX SEED OIL) 1000 MG CAPS Take 1,000 mg by mouth 3 (three) times daily.       Marland Kitchen glipiZIDE (GLUCOTROL) 5 MG tablet Take 10 mg by mouth daily.       . Glucosamine-Chondroitin (RA GLUCOSAMINE-CHONDROITIN) 750-600 MG TABS Take 1 tablet by mouth daily.       Marland Kitchen METOPROLOL TARTRATE PO Take 50 mg by mouth as directed. Taking 1/2 bid      . Multiple Vitamin (MULTIVITAMIN PO) Take 1 tablet by mouth daily after breakfast.       . Omega-3 Fatty Acids (FISH OIL) 1200 MG CAPS Take 1 capsule by mouth daily.       Marland Kitchen oxyCODONE-acetaminophen (PERCOCET/ROXICET) 5-325 MG per tablet Take 1 tablet by mouth every 4 (four) hours as needed.      . Rivaroxaban (XARELTO) 20 MG TABS Take 20 mg by mouth daily.  90 tablet  3  . vitamin C (ASCORBIC ACID) 500 MG tablet Take 500 mg by mouth daily.  Allergies  Allergen Reactions  . Erythromycin Hives  . Nitrofurantoin Other (See Comments)    Unknown   . Nitrofurantoin Monohyd Macro Other (See Comments)    Unknown   . Wool Alcohol (Lanolin Alcohol) Hives  . Neomycin-Bacitracin Zn-Polymyx Rash  . Penicillins Hives and Rash  . Sulfonamide Derivatives Hives and Rash    Review of Systems negative except from HPI and PMH  Physical Exam BP 122/58  Pulse 70  Ht 5\' 5"  (1.651 m)  Wt 132 lb (59.875 kg)  BMI 21.97 kg/m2  SpO2 98%  Well developed and well nourished in no acute distress HENT normal x  ecchmosis E scleral and icterus clear Neck Supple JVP flat; carotids brisk and full Clear to ausculation IrRegular rate and rhythm, no murmurs gallops or rub Soft with active bowel sounds No clubbing cyanosis none Edema Alert and oriented, grossly normal motor and sensory function Skin Warm and Dry  Holter monitor is as listed above  Assessment and  Plan

## 2012-02-22 NOTE — Assessment & Plan Note (Signed)
Patient has atrial fibrillation with a broad ventricular response bradycardia into the 20s during the day pauses of more than 3 seconds during the day as well as syncope. She also has daytime rates in the 150s-160s range. We have discussed the role of pacing    The benefits and risks were reviewed including but not limited to death,  perforation, infection, lead dislodgement and device malfunction.  The patient understands agrees and is willing to proceed.\

## 2012-02-25 ENCOUNTER — Telehealth: Payer: Self-pay | Admitting: Cardiology

## 2012-02-25 NOTE — Telephone Encounter (Signed)
Patient phoned wanting to know if she should hold her Xarelto for upcoming pacemaker implantation. Discussed with  Dr. Patty Sermons and he suggested to discuss with Dr Graciela Husbands.  Will forward to Dr Graciela Husbands and Denyse Amass LPN

## 2012-02-25 NOTE — Telephone Encounter (Signed)
New problem:  Was told to call back an speak with [facility].

## 2012-02-26 ENCOUNTER — Encounter (HOSPITAL_COMMUNITY): Payer: Self-pay | Admitting: Pharmacy Technician

## 2012-02-28 ENCOUNTER — Telehealth: Payer: Self-pay | Admitting: *Deleted

## 2012-02-28 MED ORDER — SODIUM CHLORIDE 0.9 % IR SOLN
80.0000 mg | Status: DC
  Filled 2012-02-28: qty 2

## 2012-02-28 MED ORDER — VANCOMYCIN HCL IN DEXTROSE 1-5 GM/200ML-% IV SOLN
1000.0000 mg | INTRAVENOUS | Status: DC
  Filled 2012-02-28 (×2): qty 200

## 2012-02-28 NOTE — Telephone Encounter (Signed)
Per Dr. Graciela Husbands, I advised pt to hold tonight's dose of Xarelto.  Pt agrees.

## 2012-02-28 NOTE — Telephone Encounter (Signed)
Message copied by Burnell Blanks on Thu Feb 28, 2012  3:44 PM ------      Message from: Cassell Clement      Created: Fri Feb 22, 2012  3:15 PM       Please report to patient.  The recent labs are stable. Continue same medication and careful diet. No anemia.

## 2012-02-28 NOTE — Telephone Encounter (Signed)
Advised patient of lab results  

## 2012-02-29 ENCOUNTER — Encounter (HOSPITAL_COMMUNITY): Payer: Self-pay | Admitting: General Practice

## 2012-02-29 ENCOUNTER — Encounter (HOSPITAL_COMMUNITY)
Admission: RE | Disposition: A | Discharge status: Home / Self Care | Payer: Self-pay | Source: Ambulatory Visit | Attending: Internal Medicine

## 2012-02-29 ENCOUNTER — Encounter: Payer: Self-pay | Admitting: *Deleted

## 2012-02-29 ENCOUNTER — Ambulatory Visit (HOSPITAL_COMMUNITY)
Admission: RE | Admit: 2012-02-29 | Discharge: 2012-03-01 | Disposition: A | Discharge status: Home / Self Care | Payer: Medicare Other | Source: Ambulatory Visit | Attending: Internal Medicine | Admitting: Internal Medicine

## 2012-02-29 DIAGNOSIS — J4489 Other specified chronic obstructive pulmonary disease: Secondary | ICD-10-CM | POA: Insufficient documentation

## 2012-02-29 DIAGNOSIS — Z950 Presence of cardiac pacemaker: Secondary | ICD-10-CM | POA: Diagnosis present

## 2012-02-29 DIAGNOSIS — E119 Type 2 diabetes mellitus without complications: Secondary | ICD-10-CM | POA: Insufficient documentation

## 2012-02-29 DIAGNOSIS — J449 Chronic obstructive pulmonary disease, unspecified: Secondary | ICD-10-CM | POA: Insufficient documentation

## 2012-02-29 DIAGNOSIS — I498 Other specified cardiac arrhythmias: Secondary | ICD-10-CM | POA: Insufficient documentation

## 2012-02-29 DIAGNOSIS — R55 Syncope and collapse: Secondary | ICD-10-CM

## 2012-02-29 DIAGNOSIS — Z7901 Long term (current) use of anticoagulants: Secondary | ICD-10-CM | POA: Insufficient documentation

## 2012-02-29 DIAGNOSIS — E7800 Pure hypercholesterolemia, unspecified: Secondary | ICD-10-CM | POA: Insufficient documentation

## 2012-02-29 DIAGNOSIS — M1990 Unspecified osteoarthritis, unspecified site: Secondary | ICD-10-CM | POA: Insufficient documentation

## 2012-02-29 DIAGNOSIS — I10 Essential (primary) hypertension: Secondary | ICD-10-CM | POA: Insufficient documentation

## 2012-02-29 DIAGNOSIS — I495 Sick sinus syndrome: Secondary | ICD-10-CM

## 2012-02-29 DIAGNOSIS — I4891 Unspecified atrial fibrillation: Secondary | ICD-10-CM | POA: Diagnosis present

## 2012-02-29 HISTORY — PX: PERMANENT PACEMAKER INSERTION: SHX5480

## 2012-02-29 HISTORY — DX: Personal history of other medical treatment: Z92.89

## 2012-02-29 HISTORY — DX: Type 2 diabetes mellitus without complications: E11.9

## 2012-02-29 HISTORY — DX: Unspecified urinary incontinence: R32

## 2012-02-29 HISTORY — DX: Other forms of dyspnea: R06.09

## 2012-02-29 HISTORY — DX: Dyspnea, unspecified: R06.00

## 2012-02-29 HISTORY — PX: INSERT / REPLACE / REMOVE PACEMAKER: SUR710

## 2012-02-29 LAB — GLUCOSE, CAPILLARY: Glucose-Capillary: 87 mg/dL (ref 70–99)

## 2012-02-29 LAB — SURGICAL PCR SCREEN: MRSA, PCR: NEGATIVE

## 2012-02-29 SURGERY — PERMANENT PACEMAKER INSERTION
Anesthesia: LOCAL

## 2012-02-29 MED ORDER — METOPROLOL TARTRATE 50 MG PO TABS
50.0000 mg | ORAL_TABLET | Freq: Two times a day (BID) | ORAL | Status: DC
  Administered 2012-02-29 – 2012-03-01 (×2): 50 mg via ORAL
  Filled 2012-02-29 (×3): qty 1

## 2012-02-29 MED ORDER — CHLORHEXIDINE GLUCONATE 4 % EX LIQD
60.0000 mL | Freq: Once | CUTANEOUS | Status: DC
  Filled 2012-02-29: qty 60

## 2012-02-29 MED ORDER — MIDAZOLAM HCL 2 MG/2ML IJ SOLN
INTRAMUSCULAR | Status: AC
  Filled 2012-02-29: qty 2

## 2012-02-29 MED ORDER — ACETAMINOPHEN 325 MG PO TABS: 325.0000 mg | ORAL_TABLET | ORAL | Status: DC | PRN

## 2012-02-29 MED ORDER — SODIUM CHLORIDE 0.9 % IV SOLN: 250.0000 mL | INTRAVENOUS | Status: DC

## 2012-02-29 MED ORDER — SODIUM CHLORIDE 0.9 % IV SOLN
INTRAVENOUS | Status: AC
  Administered 2012-02-29: 11:00:00 via INTRAVENOUS

## 2012-02-29 MED ORDER — SODIUM CHLORIDE 0.9 % IJ SOLN: 3.0000 mL | INTRAMUSCULAR | Status: DC | PRN

## 2012-02-29 MED ORDER — CITALOPRAM HYDROBROMIDE 40 MG PO TABS
40.0000 mg | ORAL_TABLET | Freq: Every day | ORAL | Status: DC
  Administered 2012-02-29 – 2012-03-01 (×2): 40 mg via ORAL
  Filled 2012-02-29 (×2): qty 1

## 2012-02-29 MED ORDER — MUPIROCIN 2 % EX OINT
TOPICAL_OINTMENT | CUTANEOUS | Status: AC
  Filled 2012-02-29: qty 22

## 2012-02-29 MED ORDER — HYDROCODONE-ACETAMINOPHEN 5-325 MG PO TABS
1.0000 | ORAL_TABLET | ORAL | Status: DC | PRN
  Administered 2012-02-29 – 2012-03-01 (×2): 1 via ORAL
  Filled 2012-02-29 (×2): qty 1

## 2012-02-29 MED ORDER — DIGOXIN 250 MCG PO TABS
0.2500 mg | ORAL_TABLET | Freq: Every day | ORAL | Status: DC
  Administered 2012-03-01: 0.25 mg via ORAL
  Filled 2012-02-29: qty 1

## 2012-02-29 MED ORDER — MUPIROCIN 2 % EX OINT
TOPICAL_OINTMENT | Freq: Two times a day (BID) | CUTANEOUS | Status: DC
  Administered 2012-02-29: 22:00:00 via NASAL
  Filled 2012-02-29: qty 22

## 2012-02-29 MED ORDER — SODIUM CHLORIDE 0.45 % IV SOLN: INTRAVENOUS | Status: DC

## 2012-02-29 MED ORDER — RIVAROXABAN 20 MG PO TABS
20.0000 mg | ORAL_TABLET | Freq: Every day | ORAL | Status: DC
  Administered 2012-02-29: 20 mg via ORAL
  Filled 2012-02-29 (×2): qty 1

## 2012-02-29 MED ORDER — FENTANYL CITRATE 0.05 MG/ML IJ SOLN
INTRAMUSCULAR | Status: AC
  Filled 2012-02-29: qty 2

## 2012-02-29 MED ORDER — GLIPIZIDE 10 MG PO TABS
10.0000 mg | ORAL_TABLET | Freq: Every day | ORAL | Status: DC
  Administered 2012-02-29 – 2012-03-01 (×2): 10 mg via ORAL
  Filled 2012-02-29 (×2): qty 1

## 2012-02-29 MED ORDER — LIDOCAINE HCL (PF) 1 % IJ SOLN
INTRAMUSCULAR | Status: AC
  Filled 2012-02-29: qty 60

## 2012-02-29 MED ORDER — METOPROLOL TARTRATE 50 MG PO TABS
50.0000 mg | ORAL_TABLET | Freq: Two times a day (BID) | ORAL | Status: DC
  Filled 2012-02-29 (×2): qty 1

## 2012-02-29 MED ORDER — SODIUM CHLORIDE 0.9 % IJ SOLN: 3.0000 mL | Freq: Two times a day (BID) | INTRAMUSCULAR | Status: DC

## 2012-02-29 MED ORDER — VANCOMYCIN HCL IN DEXTROSE 1-5 GM/200ML-% IV SOLN
1000.0000 mg | Freq: Two times a day (BID) | INTRAVENOUS | Status: AC
  Administered 2012-02-29: 1000 mg via INTRAVENOUS
  Filled 2012-02-29: qty 200

## 2012-02-29 MED ORDER — ONDANSETRON HCL 4 MG/2ML IJ SOLN: 4.0000 mg | Freq: Four times a day (QID) | INTRAMUSCULAR | Status: DC | PRN

## 2012-02-29 NOTE — Interval H&P Note (Signed)
History and Physical Interval Note:  02/29/2012 7:26 AM  Erin Vang  has presented today for surgery, with the diagnosis of snycope  The various methods of treatment have been discussed with the patient and family. After consideration of risks, benefits and other options for treatment, the patient has consented to  Procedure(s) (LRB) with comments: PERMANENT PACEMAKER INSERTION (N/A) as a surgical intervention .  The patient's history has been reviewed, patient examined, no change in status, stable for surgery.  I have reviewed the patient's chart and labs.  Questions were answered to the patient's satisfaction.     Sherryl Manges

## 2012-02-29 NOTE — H&P (View-Only) (Signed)
. Patient Care Team: Arthur G Green, MD as PCP - General (Internal Medicine) Thomas Brackbill, MD as Referring Physician (Cardiology)   HPI  Erin Vang is a 76 y.o. female Seen in followup for a visit about 3 weeks ago at which time the issue was raised as to whether she would benefit from pacing in the context of permanent atrial fibrillation with demonstrated heart rates running from 30-160 but relation to her current medication regime is not clear She does have a history of syncope She was to wear a Holter monitor  She has had intercurrent syncope. The Holter monitor demonstrates rates into the 20s pauses of more than 4 seconds as well as heart rates in the 150s. There continues to be problems with fatigue and lightheadedness  Past Medical History  Diagnosis Date  . Hypertension   . Diabetes mellitus   . Osteoarthritis   . Hypercholesterolemia   . Paroxysmal atrial fibrillation     CHADS2: 2; managed with rate control and anticoagulation.   . Syncope and collapse     When patient stands up real quickly  . Normal cardiac stress test     a.  lexiscan MV - EF 72%, No ischemia  . Anemia     ? of lab error vs GI bleed  . COPD (chronic obstructive pulmonary disease)   . Weight loss   . Depression   . Prolonged grief reaction   . Chronic anticoagulation     On Xarelto    Past Surgical History  Procedure Date  . Gallbladder surgery   . Appendectomy     age 15  . Cervical spine surgery jan 2011  . Hip arthroplasty 11/08/2011    Procedure: ARTHROPLASTY BIPOLAR HIP;  Surgeon: Matthew D Olin, MD;  Location: [facility] OR;  Service: Orthopedics;  Laterality: Left;    Current Outpatient Prescriptions  Medication Sig Dispense Refill  . Calcium Carbonate (CALCIUM 600 PO) Take 1 tablet by mouth daily.       . Cholecalciferol (VITAMIN D PO) Take 2,000 mg by mouth daily.        . citalopram (CELEXA) 40 MG tablet Take 40 mg by mouth daily.      . digoxin (LANOXIN) 0.25 MG tablet Take 0.25  mg by mouth daily.      . ferrous sulfate 325 (65 FE) MG tablet Take 325 mg by mouth daily with breakfast.      . Flaxseed, Linseed, (FLAX SEED OIL) 1000 MG CAPS Take 1,000 mg by mouth 3 (three) times daily.       . glipiZIDE (GLUCOTROL) 5 MG tablet Take 10 mg by mouth daily.       . Glucosamine-Chondroitin (RA GLUCOSAMINE-CHONDROITIN) 750-600 MG TABS Take 1 tablet by mouth daily.       . METOPROLOL TARTRATE PO Take 50 mg by mouth as directed. Taking 1/2 bid      . Multiple Vitamin (MULTIVITAMIN PO) Take 1 tablet by mouth daily after breakfast.       . Omega-3 Fatty Acids (FISH OIL) 1200 MG CAPS Take 1 capsule by mouth daily.       . oxyCODONE-acetaminophen (PERCOCET/ROXICET) 5-325 MG per tablet Take 1 tablet by mouth every 4 (four) hours as needed.      . Rivaroxaban (XARELTO) 20 MG TABS Take 20 mg by mouth daily.  90 tablet  3  . vitamin C (ASCORBIC ACID) 500 MG tablet Take 500 mg by mouth daily.            Allergies  Allergen Reactions  . Erythromycin Hives  . Nitrofurantoin Other (See Comments)    Unknown   . Nitrofurantoin Monohyd Macro Other (See Comments)    Unknown   . Wool Alcohol (Lanolin Alcohol) Hives  . Neomycin-Bacitracin Zn-Polymyx Rash  . Penicillins Hives and Rash  . Sulfonamide Derivatives Hives and Rash    Review of Systems negative except from HPI and PMH  Physical Exam BP 122/58  Pulse 70  Ht 5' 5" (1.651 m)  Wt 132 lb (59.875 kg)  BMI 21.97 kg/m2  SpO2 98%  Well developed and well nourished in no acute distress HENT normal x  ecchmosis E scleral and icterus clear Neck Supple JVP flat; carotids brisk and full Clear to ausculation IrRegular rate and rhythm, no murmurs gallops or rub Soft with active bowel sounds No clubbing cyanosis none Edema Alert and oriented, grossly normal motor and sensory function Skin Warm and Dry  Holter monitor is as listed above  Assessment and  Plan  

## 2012-02-29 NOTE — CV Procedure (Signed)
Erin Vang [id number]  [id number]  Preop Dx: afib, brady and syncope pauses >5 sec Postop Dx same/  Cx: none apparent   Procedure single pacemaker implantation  After routine prep and drape, lidocaine was infiltrated in the prepectoral subclavicular region on the left side an incision was made and carried down to later the prepectoral fascia using electrocautery and sharp dissection a pocket was formed similarly. Hemostasis was obtained.  After this, we turned our attention to gaining accessm to the extrathoracic,left subclavian vein. This was accomplished without difficulty and without the aspiration of air or puncture of the artery. 2 separate venipunctures were accomplished; guidewires were placed and retained and sequentially 7 French sheath through which were  passed an [employer] ventricular lead serial [id number]  .  The ventricular lead was manipulated to the right ventricular apex with a bipolar R wave was 15.9,  the pacing impedance was 934, the threshold was 0.4 @ 0.5 msec  Current at threshold was   0.5  Ma    The lead was affixed to the prepectoral fascia and attached to a  Medtronic adapta  pulse generator  Serial number [id number] .  Hemostasis was obtained. The pocket was copiously irrigated with antibiotic containing saline solution. The leads and the pulse generator were placed in the pocket and affixed to the prepectoral fascia. The wound iwas then closed in 2  layers in normal fashion. The wound was washed dried and a benzoin Steri-Strip dressing  was applied.  Needle count, sponge count  and instrument counts were correct at the end of the procedure .Marland Kitchen The patient tolerated the procedure without apparent complication.

## 2012-03-01 ENCOUNTER — Ambulatory Visit (HOSPITAL_COMMUNITY): Payer: Medicare Other

## 2012-03-01 DIAGNOSIS — I495 Sick sinus syndrome: Secondary | ICD-10-CM

## 2012-03-01 DIAGNOSIS — I4891 Unspecified atrial fibrillation: Secondary | ICD-10-CM

## 2012-03-01 LAB — GLUCOSE, CAPILLARY

## 2012-03-01 NOTE — Progress Notes (Signed)
[name], female   DOB: 1935-11-04, 76 y.o.   MRN: [id number] Subjective:  No chest pain or sob after PPM insertion.  Objective:  Vital Signs in the last 24 hours: Temp:  [97.8 F (36.6 C)-98.3 F (36.8 C)] 98.2 F (36.8 C) (11/09 0700) Pulse Rate:  [61-73] 73  (11/09 0700) Resp:  [18] 18  (11/09 0700) BP: (115-154)/(60-75) 119/73 mmHg (11/09 0700) SpO2:  [94 %-99 %] 96 % (11/09 0700)  Intake/Output from previous day: 11/08 0701 - 11/09 0700 In: 460 [P.O.:460] Out: -  Intake/Output from this shift:    Physical Exam: Well appearing elderly woman, NAD HEENT: Unremarkable Neck:  No JVD, no thyromegally Lungs:  Clear with no hematoma over PPM insertion site and no bleeding HEART:  Regular rate rhythm, no murmurs, no rubs, no clicks Abd:  Flat, positive bowel sounds, no organomegally, no rebound, no guarding Ext:  2 plus pulses, no edema, no cyanosis, no clubbing Skin:  No rashes no nodules Neuro:  CN II through XII intact, motor grossly intact  Lab Results: No results found for this basename: WBC:2,HGB:2,PLT:2 in the last 72 hours No results found for this basename: NA:2,K:2,CL:2,CO2:2,GLUCOSE:2,BUN:2,CREATININE:2 in the last 72 hours No results found for this basename: TROPONINI:2,CK,MB:2 in the last 72 hours Hepatic Function Panel No results found for this basename: PROT,ALBUMIN,AST,ALT,ALKPHOS,BILITOT,BILIDIR,IBILI in the last 72 hours No results found for this basename: CHOL in the last 72 hours No results found for this basename: PROTIME in the last 72 hours  Imaging: Dg Chest 2 View  03/01/2012  *RADIOLOGY REPORT*  Clinical Data: Status post pacemaker placement for bradycardia and syncope.  CHEST - 2 VIEW  Comparison: 11/07/2011  Findings: Single lead pacemaker via the left subclavian vein shows appropriate positioning with single lead tip located at the right ventricular apex.  No pneumothorax, edema, pulmonary consolidation or pleural effusion identified  after the procedure.  The heart size and mediastinal contours are stable.  Bony structures show osteopenia as well as mild spondylosis of the thoracic spine.  IMPRESSION: No complications after single pacemaker placement.   Original Report Authenticated By: Irish Lack, M.D.     Cardiac Studies: Tele - atrial fib with intermittant ventricular pacing  Assessment/Plan:  1. Symptomatic tachy-brady syndrome 2. Chronic atrial fib 3. S/p PPM Rec: ok for discharge, usual followup. No change in medical therapy. No change in medications.   LOS: 1 day    Lewayne Bunting, M.D. 03/01/2012, 8:26 AM

## 2012-03-01 NOTE — Discharge Summary (Signed)
Discharge Summary   Patient ID: DELLAR Vang,  MRN: [id number], DOB/AGE: Jun 11, 1935 76 y.o.  Admit date: 02/29/2012 Discharge date: 03/01/2012  Primary Physician: Kimber Relic, MD Primary Cardiologist: Ronny Flurry, MD; Berton Mount, MD (EP)  Discharge Diagnoses Principal Problem:  *Tachy-brady syndrome Active Problems:  Atrial fibrillation  Pacemaker-Medtronic   Allergies Allergies  Allergen Reactions  . Penicillins Hives and Rash  . Erythromycin Hives  . Neomycin-Bacitracin Zn-Polymyx Rash  . Sulfonamide Derivatives Hives and Rash  . Wool Alcohol (Lanolin Alcohol) Hives  . Nitrofurantoin Other (See Comments)    Unknown   . Nitrofurantoin Monohyd Macro Other (See Comments)    Unknown     Diagnostic Studies/Procedures  SINGLE PACEMAKER IMPLANTATION - 02/29/12  Successful implantation of St. Jude single-lead PM   PA/LATERAL CHEST X-RAY - 03/01/12  Comparison: 11/07/2011  Findings: Single lead pacemaker via the left subclavian vein shows  appropriate positioning with single lead tip located at the right  ventricular apex. No pneumothorax, edema, pulmonary consolidation  or pleural effusion identified after the procedure. The heart size  and mediastinal contours are stable. Bony structures show  osteopenia as well as mild spondylosis of the thoracic spine.  IMPRESSION:  No complications after single pacemaker placement.  History of Present Illness/Hospital Course  [name] is a 76yo female who presented to [facility] hospital on 02/29/12 for elective outpatient pacemaker implantation for the above problem list. She had seen Dr. Graciela Husbands earlier this month, and had been followed for intercurrent syncope, lightheadedness and fatigue attributed to sinus node dysfunction and tachy-brady syndrome. It was felt she would benefit from pacing. The details of the procedure including the risks and benefits were clearly outlined to the patient, and the decision was made to  proceed. She was informed and consented, and prepped for the procedure which is clearly outlined in her chart. The end result was successful implantation of a St. Jude single-lead PM. She tolerated this well, without complications. She remained stable overnight, was assessed by Dr. Ladona Ridgel this morning. CXR as above revealed no complications post-device placement. She will be discharged today with wound clinic follow-up in ~ 10 days and EP follow-up ~ 3 months. This information, including post-device activity restrictions has been clearly outlined in the discharge AVS. She will resume all home medications.   Discharge Vitals:  Blood pressure 119/73, pulse 73, temperature 98.2 F (36.8 C), temperature source Oral, resp. rate 18, height 5\' 5"  (1.651 m), weight 59.421 kg (131 lb), SpO2 96.00%.   Labs:  Disposition:   Follow-up Information    Follow up with [facility]. (For wound check and device follow-up. Office will call with appointment date & time. )    Contact information:   [address] [place] [address]          Discharge Medications:    Medication List     As of 03/01/2012  8:32 AM    CONTINUE taking these medications         CALCIUM 600 PO      citalopram 40 MG tablet   Commonly known as: CELEXA      digoxin 0.25 MG tablet   Commonly known as: LANOXIN      Fish Oil 1200 MG Caps      Flax Seed Oil 1000 MG Caps      glipiZIDE 10 MG tablet   Commonly known as: GLUCOTROL      HYDROcodone-acetaminophen 5-325 MG per tablet   Commonly known as:  NORCO/VICODIN      metoprolol 50 MG tablet   Commonly known as: LOPRESSOR      MULTIVITAMIN PO      RA GLUCOSAMINE-CHONDROITIN 750-600 MG Tabs   Generic drug: Glucosamine-Chondroitin      Rivaroxaban 20 MG Tabs   Commonly known as: XARELTO   Take 20 mg by mouth daily.      vitamin C 500 MG tablet   Commonly known as: ASCORBIC ACID      VITAMIN D PO       Outstanding Labs/Studies:  None  Duration of Discharge Encounter: Greater than 30 minutes including physician time.  Signed, R. Hurman Horn, PA-C 03/01/2012, 8:32 AM

## 2012-03-04 ENCOUNTER — Telehealth: Payer: Self-pay | Admitting: Internal Medicine

## 2012-03-04 NOTE — Telephone Encounter (Signed)
New problem:   Need to discuss medication if it should be changed or not due to recent pacemaker.

## 2012-03-05 NOTE — Telephone Encounter (Signed)
Verified upcoming appts and current meds with patient.

## 2012-03-13 ENCOUNTER — Ambulatory Visit (INDEPENDENT_AMBULATORY_CARE_PROVIDER_SITE_OTHER): Payer: Medicare Other | Admitting: *Deleted

## 2012-03-13 DIAGNOSIS — I495 Sick sinus syndrome: Secondary | ICD-10-CM

## 2012-03-13 LAB — PACEMAKER DEVICE OBSERVATION
BMOD-0003RV: 30
BRDY-0002RV: 70 {beats}/min
BRDY-0004RV: 130 {beats}/min
VENTRICULAR PACING PM: 91

## 2012-03-13 NOTE — Progress Notes (Signed)
Wound check-PPM 

## 2012-03-13 NOTE — Patient Instructions (Addendum)
Return office visit 06/20/12@1 :45pm with Dr.Klein.

## 2012-04-04 ENCOUNTER — Encounter: Payer: Self-pay | Admitting: Internal Medicine

## 2012-04-22 ENCOUNTER — Telehealth: Payer: Self-pay

## 2012-04-22 ENCOUNTER — Encounter: Payer: Self-pay | Admitting: Cardiology

## 2012-04-22 ENCOUNTER — Ambulatory Visit (INDEPENDENT_AMBULATORY_CARE_PROVIDER_SITE_OTHER): Payer: Medicare Other | Admitting: Cardiology

## 2012-04-22 VITALS — BP 110/62 | HR 77 | Resp 18 | Ht 65.0 in | Wt 136.8 lb

## 2012-04-22 DIAGNOSIS — I10 Essential (primary) hypertension: Secondary | ICD-10-CM

## 2012-04-22 DIAGNOSIS — F329 Major depressive disorder, single episode, unspecified: Secondary | ICD-10-CM

## 2012-04-22 DIAGNOSIS — F341 Dysthymic disorder: Secondary | ICD-10-CM

## 2012-04-22 DIAGNOSIS — I495 Sick sinus syndrome: Secondary | ICD-10-CM

## 2012-04-22 DIAGNOSIS — I4891 Unspecified atrial fibrillation: Secondary | ICD-10-CM

## 2012-04-22 NOTE — Telephone Encounter (Signed)
The medicine I was concerned about was Lanoxin.  Our chart said 0.25 mg twice a day.  He was she taking Lanoxin and if so what size and how much?

## 2012-04-22 NOTE — Assessment & Plan Note (Signed)
Blood pressure is improved on current therapy.  No headaches dizziness or syncope

## 2012-04-22 NOTE — Assessment & Plan Note (Signed)
Her symptoms of reactive depression following her husband's death appear to be gradually subsiding

## 2012-04-22 NOTE — Progress Notes (Signed)
Erin Vang Date of Birth:  22-Jan-1936 [facility] [address] [place], [place]  [address] [phone]  Fax   [phone]  HPI: This pleasant 76 year old woman is seen for a scheduled followup office visit.  She has a past history of tachybradycardia syndrome with chronic atrial fibrillation and is on Xarelto.  She has a permanent pacemaker implanted by Dr. Graciela Husbands.  Since the implantation of the pacemaker had no further dizzy spells or syncope.  The patient is somewhat unclear about what medication she is presently taking.  The computer states that she is on Lanoxin 0.25 mg twice a day.  I doubt that she is on that much but she will call when she gets home to let us know how much she is taking.  She is also taking metoprolol tartrate 50 mg tablets one half tablet twice a day.  She feels well on current dose of medication and does not have any GI symptoms.  Current Outpatient Prescriptions  Medication Sig Dispense Refill  . Calcium Carbonate (CALCIUM 600 PO) Take 1 tablet by mouth daily.       . Cholecalciferol (VITAMIN D PO) Take 2,000 mg by mouth daily.        . citalopram (CELEXA) 40 MG tablet Take 40 mg by mouth daily.      . digoxin (LANOXIN) 0.25 MG tablet Take 0.25 mg by mouth 2 (two) times daily.       . Flaxseed, Linseed, (FLAX SEED OIL) 1000 MG CAPS Take 1,000 mg by mouth 3 (three) times daily.       Marland Kitchen glipiZIDE (GLUCOTROL) 10 MG tablet Take 10 mg by mouth daily.      . Glucosamine-Chondroitin (RA GLUCOSAMINE-CHONDROITIN) 750-600 MG TABS Take 1 tablet by mouth daily.       Marland Kitchen HYDROcodone-acetaminophen (NORCO/VICODIN) 5-325 MG per tablet Take 1 tablet by mouth every 4 (four) hours as needed. For pain      . metoprolol (LOPRESSOR) 50 MG tablet Take 25 mg by mouth 2 (two) times daily.      . Multiple Vitamin (MULTIVITAMIN PO) Take 1 tablet by mouth daily after breakfast.       . Omega-3 Fatty Acids (FISH OIL) 1200 MG CAPS Take 1 capsule by mouth daily.       .  Rivaroxaban (XARELTO) 20 MG TABS Take 20 mg by mouth daily.  90 tablet  3  . vitamin C (ASCORBIC ACID) 500 MG tablet Take 500 mg by mouth daily.          Allergies  Allergen Reactions  . Penicillins Hives and Rash  . Erythromycin Hives  . Neomycin-Bacitracin Zn-Polymyx Rash  . Sulfonamide Derivatives Hives and Rash  . Wool Alcohol (Lanolin Alcohol) Hives  . Nitrofurantoin Other (See Comments)    Unknown   . Nitrofurantoin Monohyd Macro Other (See Comments)    Unknown     Patient Active Problem List  Diagnosis  . DIABETES MELLITUS, TYPE II  . ANXIETY  . HYPERTENSION  . ALLERGIC RHINITIS  . DYSPNEA  . Osteoarthritis  . Hypercholesterolemia  . Benign hypertensive heart disease without heart failure  . COPD (chronic obstructive pulmonary disease)  . Reactive depression  . Anorexia  . Anemia  . Weight loss, non-intentional  . Bradycardia  . Normal cardiac stress test  . Atrial fibrillation  . Constipation, chronic  . Closed left hip fracture  . Pacemaker-Medtronic  . Tachy-brady syndrome    History  Smoking status  . Former Smoker --  2.0 packs/day for 35 years  . Types: Cigarettes  . Quit date: 04/23/1986  Smokeless tobacco  . Never Used    History  Alcohol Use No    Family History  Problem Relation Age of Onset  . Heart failure Mother     Died 16  . Cancer Father     Died 58    Review of Systems: The patient denies any heat or cold intolerance.  No weight gain or weight loss.  The patient denies headaches or blurry vision.  There is no cough or sputum production.  The patient denies dizziness.  There is no hematuria or hematochezia.  The patient denies any muscle aches or arthritis.  The patient denies any rash.  The patient denies frequent falling or instability.  There is no history of depression or anxiety.  All other systems were reviewed and are negative.   Physical Exam: Filed Vitals:   04/22/12 1328  BP: 110/62  Pulse: 77  Resp: 18   general  appearance reveals an elderly woman in no acute distress.The head and neck exam reveals pupils equal and reactive.  Extraocular movements are full.  There is no scleral icterus.  The mouth and pharynx are normal.  The neck is supple.  The carotids reveal no bruits.  The jugular venous pressure is normal.  The  thyroid is not enlarged.  There is no lymphadenopathy.  The pacemaker pocket in the left upper chest is well healed. The chest is clear to percussion and auscultation.  There are no rales or rhonchi.  Expansion of the chest is symmetrical.  The precordium is quiet.  The first heart sound is normal.  The second heart sound is physiologically split.  There is no murmur gallop rub or click.  There is no abnormal lift or heave.  The abdomen is soft and nontender.  The bowel sounds are normal.  The liver and spleen are not enlarged.  There are no abdominal masses.  There are no abdominal bruits.  Extremities reveal good pedal pulses.  There is no phlebitis or edema.  There is no cyanosis or clubbing.  Strength is normal and symmetrical in all extremities.  There is no lateralizing weakness.  There are no sensory deficits.  The skin is warm and dry.  There is no rash.      Assessment / Plan: Continue same medication.  She will call us with correct dosages of her heart medicines.  Recheck in 2 months for followup office visit and EKG.

## 2012-04-22 NOTE — Telephone Encounter (Signed)
PT CALLED AND STATED SHE WAS TAKING THE METOPROLOL 50 MG; 1/2 TABLET TWICE DAILY. THE LOSARTAN SHE IS NOT TAKING; SHE TOLD NOT TO TAKE.PT WOULD LIKE DR.BRACKBILL TO CALL HER BACK TO MAKE SURE SHE IS TAKING PROPERLY.PT IS HOME FOR THE EVENING.

## 2012-04-22 NOTE — Assessment & Plan Note (Signed)
The patient has not been aware of any racing of her heart 

## 2012-04-22 NOTE — Patient Instructions (Addendum)
Your physician recommends that you continue on your current medications as directed. Please refer to the Current Medication list given to you today.  Your physician recommends that you schedule a follow-up appointment in: Feb. 28, 2014 at 2:15

## 2012-04-28 NOTE — Telephone Encounter (Signed)
Thanks

## 2012-04-28 NOTE — Telephone Encounter (Signed)
Patient taking Lanoxin 0.25 mg once daily, Metoprolol 50 mg 1/2 twice day, and Xarelto daily did discontinue Losartan.  Will forward to  Dr. Patty Sermons so he will be aware.   Patient states she does not need any refills at this time

## 2012-05-09 ENCOUNTER — Other Ambulatory Visit: Payer: Self-pay | Admitting: Nurse Practitioner

## 2012-06-20 ENCOUNTER — Ambulatory Visit (INDEPENDENT_AMBULATORY_CARE_PROVIDER_SITE_OTHER): Payer: Medicare Other | Admitting: Internal Medicine

## 2012-06-20 ENCOUNTER — Encounter: Payer: Medicare Other | Admitting: Internal Medicine

## 2012-06-20 ENCOUNTER — Ambulatory Visit: Payer: Medicare Other | Admitting: Cardiology

## 2012-06-20 ENCOUNTER — Encounter: Payer: Self-pay | Admitting: Internal Medicine

## 2012-06-20 VITALS — BP 149/72 | HR 90 | Ht 65.5 in | Wt 138.8 lb

## 2012-06-20 DIAGNOSIS — Z950 Presence of cardiac pacemaker: Secondary | ICD-10-CM

## 2012-06-20 DIAGNOSIS — I4891 Unspecified atrial fibrillation: Secondary | ICD-10-CM

## 2012-06-20 DIAGNOSIS — I119 Hypertensive heart disease without heart failure: Secondary | ICD-10-CM

## 2012-06-20 DIAGNOSIS — I495 Sick sinus syndrome: Secondary | ICD-10-CM

## 2012-06-20 LAB — PACEMAKER DEVICE OBSERVATION
BMOD-0001RV: LOW
BRDY-0002RV: 70 {beats}/min
BRDY-0004RV: 130 {beats}/min

## 2012-06-20 NOTE — Assessment & Plan Note (Signed)
The patient's device was interrogated and the information was fully reviewed.  The device was reprogrammed to  Maximize longevity. Also her rate response was decreased on activity level from 3-to

## 2012-06-20 NOTE — Assessment & Plan Note (Signed)
He has significant fatigue. We will undertake a metoprolol withdrawal trial to see if that's the culprit. She'll let us know.

## 2012-06-20 NOTE — Progress Notes (Signed)
Patient Care Team: Kimber Relic, MD as PCP - General (Internal Medicine) Cassell Clement, MD as Referring Physician (Cardiology)   HPI  Erin Vang is a 77 y.o. female Seen in followup for atrial fibrillation and syncope with pauses of greater than 5 seconds and is status post pacemaker implantation 11/13  Her big complaint is fatigue. She also complained about the local anesthesia she got at the time of her device implantation   Past Medical History  Diagnosis Date  . Hypertension   . Osteoarthritis   . Hypercholesterolemia   . Paroxysmal atrial fibrillation     CHADS2: 2; managed with rate control and anticoagulation.   . Syncope and collapse     When patient stands up real quickly  . Normal cardiac stress test     a.  lexiscan MV - EF 72%, No ischemia  . Anemia     ? of lab error vs GI bleed  . COPD (chronic obstructive pulmonary disease)   . Weight loss   . Chronic anticoagulation     On Xarelto  . Pacemaker   . Pneumonia   . Exertional dyspnea   . Type II diabetes mellitus   . History of blood transfusion     "wasn't needed; had some bad lab results" (March 19, 2012)  . Depression   . Prolonged grief reaction     "husband died 1 yr ago" (March 19, 2012)  . Urinary incontinence     Past Surgical History  Procedure Laterality Date  . Appendectomy  1952  . Cervical disc surgery  04/2009  . Hip arthroplasty  11/08/2011    Procedure: ARTHROPLASTY BIPOLAR HIP;  Surgeon: Shelda Pal, MD;  Location: [facility] OR;  Service: Orthopedics;  Laterality: Left;  . Tubal ligation  1970's  . Cholecystectomy    . Insert / replace / remove pacemaker  2012-03-19    Current Outpatient Prescriptions  Medication Sig Dispense Refill  . Calcium Carbonate (CALCIUM 600 PO) Take 1 tablet by mouth daily.       . Cholecalciferol (VITAMIN D PO) Take 2,000 mg by mouth daily.        . citalopram (CELEXA) 40 MG tablet Take 20 mg by mouth daily.       . Flaxseed, Linseed, (FLAX SEED OIL) 1000 MG CAPS  Take 1,000 mg by mouth 3 (three) times daily.       Marland Kitchen glipiZIDE (GLUCOTROL) 10 MG tablet Take 10 mg by mouth daily.      . Glucosamine-Chondroitin (RA GLUCOSAMINE-CHONDROITIN) 750-600 MG TABS Take 1 tablet by mouth daily.       Marland Kitchen LANOXIN 0.25 MG tablet TAKE 1 TABLET BY MOUTH DAILY  90 tablet  1  . metoprolol (LOPRESSOR) 50 MG tablet Take 25 mg by mouth 2 (two) times daily.      . Multiple Vitamin (MULTIVITAMIN PO) Take 1 tablet by mouth daily after breakfast.       . Omega-3 Fatty Acids (FISH OIL) 1200 MG CAPS Take 1 capsule by mouth daily.       . vitamin C (ASCORBIC ACID) 500 MG tablet Take 500 mg by mouth daily.        Carlena Hurl 20 MG TABS TAKE 1 TABLET BY MOUTH DAILY  90 tablet  1   No current facility-administered medications for this visit.    Allergies  Allergen Reactions  . Penicillins Hives and Rash  . Erythromycin Hives  . Neomycin-Bacitracin Zn-Polymyx Rash  . Sulfonamide Derivatives Hives and Rash  .  Wool Alcohol (Lanolin Alcohol) Hives  . Nitrofurantoin Other (See Comments)    Unknown   . Nitrofurantoin Monohyd Macro Other (See Comments)    Unknown     Review of Systems negative except from HPI and PMH  Physical Exam BP 149/72  Pulse 90  Ht 5' 5.5" (1.664 m)  Wt 138 lb 12.8 oz (62.959 kg)  BMI 22.74 kg/m2 Well developed and well nourished in no acute distress HENT normal E scleral and icterus clear Neck Supple JVP flat; carotids brisk and full Device pocket well healed; without hematoma or erythema  Clear to ausculation  Regular rate and rhythm, no murmurs gallops or rub Soft with active bowel sounds No clubbing cyanosis none Edema Alert and oriented, grossly normal motor and sensory function Skin Warm and Dry    Assessment and  Plan

## 2012-06-20 NOTE — Patient Instructions (Signed)
Your physician has recommended you make the following change in your medication:  1) hold  Metoprolol for 2 weeks, then call the office back at [phone] to let us know how you are doing.  Your physician wants you to follow-up in: November 2014 with Dr. Graciela Husbands. You will receive a reminder letter in the mail two months in advance. If you don't receive a letter, please call our office to schedule the follow-up appointment.

## 2012-06-20 NOTE — Assessment & Plan Note (Signed)
Continue anticoagulation 

## 2012-06-20 NOTE — Assessment & Plan Note (Signed)
Is paced 89% of the time.

## 2012-07-16 ENCOUNTER — Telehealth: Payer: Self-pay | Admitting: Internal Medicine

## 2012-07-16 NOTE — Telephone Encounter (Signed)
New problem     Was told to call back     Changed in medication is working.

## 2012-07-16 NOTE — Telephone Encounter (Signed)
Erin Vang states she is feeling much better and not dizzy since she had the pacemaker and medication change, ( stopper her Metoprolol).  She wants to know if she should stop her Lopressor also?  She is still feeling tired and weakness in her legs (bil).

## 2012-07-17 NOTE — Telephone Encounter (Signed)
Pt was notified.  

## 2012-07-17 NOTE — Telephone Encounter (Signed)
Stay off betablockers

## 2012-07-17 NOTE — Telephone Encounter (Signed)
Metoprolol= lopressor so i am confused.  She shoukd stay off lopressor  thx

## 2012-07-22 ENCOUNTER — Ambulatory Visit (INDEPENDENT_AMBULATORY_CARE_PROVIDER_SITE_OTHER): Payer: Medicare Other | Admitting: Neurology

## 2012-07-22 ENCOUNTER — Encounter: Payer: Self-pay | Admitting: Neurology

## 2012-07-22 VITALS — BP 134/83 | HR 104 | Temp 97.8°F | Ht 66.0 in | Wt 140.0 lb

## 2012-07-22 DIAGNOSIS — H8110 Benign paroxysmal vertigo, unspecified ear: Secondary | ICD-10-CM

## 2012-07-22 DIAGNOSIS — H9319 Tinnitus, unspecified ear: Secondary | ICD-10-CM

## 2012-07-22 DIAGNOSIS — R42 Dizziness and giddiness: Secondary | ICD-10-CM

## 2012-07-22 DIAGNOSIS — H9312 Tinnitus, left ear: Secondary | ICD-10-CM

## 2012-07-22 HISTORY — DX: Tinnitus, left ear: H93.12

## 2012-07-22 HISTORY — DX: Benign paroxysmal vertigo, unspecified ear: H81.10

## 2012-07-22 NOTE — Patient Instructions (Addendum)
I think overall you are doing fairly well but I do want to suggest a few things today: Your dizziness may be a combination of factors. Let's do a plain head CT and we will look at her blood vessels in your neck with an ultrasound. I also want you to see an Ear, nose, throat doctor for your symptoms.   Remember to drink plenty of fluid, eat healthy meals and do not skip any meals. Try to eat protein with a every meal and eat a healthy snack such as fruit or nuts in between meals. Try to keep a regular sleep-wake schedule and try to exercise daily, particularly in the form of walking, 20-30 minutes a day, if you can.   As far as your medications are concerned, I would like to suggest no changes.   I would like to see you back in 3 months, sooner if we need to. Please call us with any interim questions, concerns, problems, updates or refill requests.  Please also call us for any test results so we can go over those with you on the phone. Brett Canales is my clinical assistant and will answer any of your questions and relay your messages to me and also relay most of my messages to you.  Our phone number is [phone]. We also have an after hours call service for urgent matters and there is a physician on-call for urgent questions. For any emergencies you know to call 911 or go to the nearest emergency room.

## 2012-07-22 NOTE — Progress Notes (Signed)
Subjective:    [name] is a 77 y.o. female.  HPI Huston Foley, MD, PhD [facility] [address] [address] [place], [place] [address]  Dear Erin Vang, I saw your patient, [name], upon your kind request in my neurologic clinic today for initial consultation of her dizziness and her recurrent falls. The patient is accompanied by her son, [name], today. As you know, Ms. [name] is a very friendly 77 year old right-handed woman who has been suffering from balance problems and feeling and recurrent falls for the past several months, perhaps even a couple of years. Her husband past away in 7/12 and she has been living by herself. She has not driven in a few months. She has fallen once or twice per month on average. She has an underlying history of hyperlipidemia, diabetes, hypertension and insomnia and is status post cholecystectomy, cataract surgery, and cervical spine surgery in 2011. She does her own laundry, her own cleaning and is independent in her ADLs. Son and daughter help with groceries. She is going through [facility] at this time. She is not sure, if it has helped.  She describes a sense of vertigo, when she changes position quickly and has had tinnitus intermittently and denies hearing loss. Son describes perhaps minimal hearing loss.  She most recently fell last week, when her neighbor called her and she stood up too fast to go to the kitchen to check on what cereal she needed. She fell and hit her buttocks and had soreness and did not hit her head and no LOC is reported. She denies CP or SOB. She has never had any issues with one-sided weakness, numbness, tingling, slurring of speech or droopy face. Workup in the past has included a MRI lumbar spine in 2000, which showed multilevel facet arthropathy, moderate central spinal canal stenosis and right foraminal narrowing at L4-5. She had an EMG in 2010 which apparently was normal. She had I nerve  conduction study as well which was normal. She had a CT of the cervical spine in 2010 which showed multilevel cervical spondylosis, most severe at C5-6 with moderate central canal stenosis secondary to large disc osteophyte complex. Bilateral foraminal stenosis predominantly do to spurring. She had echocardiogram in 2011 which showed mild LVH with normal systolic function and no wall motion abnormalities. She has CT cervical spine in 2011 which was a suboptimal study. She had a repeat echocardiogram and February 2013 which showed mild LVH, EF was estimated to be 55%, mild mitral valve regurgitation.  She had a PM placed in 8/13 and her falls and dizziness are perhaps a little better. She has been monitoring her BP, which has been in the 120s-150s/high 50s-70s, BS have been in the 120s to 160s fasting.  She reports intermittent tinnitus particularly on the left side.  Her Past Medical History Is Significant For: Past Medical History  Diagnosis Date  . Hypertension   . Osteoarthritis   . Hypercholesterolemia   . Paroxysmal atrial fibrillation     CHADS2: 2; managed with rate control and anticoagulation.   . Syncope and collapse     When patient stands up real quickly  . Normal cardiac stress test     a.  lexiscan MV - EF 72%, No ischemia  . Anemia     ? of lab error vs GI bleed  . COPD (chronic obstructive pulmonary disease)   . Weight loss   . Chronic anticoagulation     On  Xarelto  . Pacemaker   . Pneumonia   . Exertional dyspnea   . Type II diabetes mellitus   . History of blood transfusion     "wasn't needed; had some bad lab results" (03-24-12)  . Depression   . Prolonged grief reaction     "husband died 1 yr ago" (2012-03-24)  . Urinary incontinence    Her Past Surgical History Is Significant For: Past Surgical History  Procedure Laterality Date  . Appendectomy  1952  . Cervical disc surgery  04/2009  . Hip arthroplasty  11/08/2011    Procedure: ARTHROPLASTY BIPOLAR HIP;   Surgeon: Shelda Pal, MD;  Location: [facility] OR;  Service: Orthopedics;  Laterality: Left;  . Insert / replace / remove pacemaker  2012-03-24  . Tubal ligation  1974  . Cholecystectomy  1984  . Polypectomy  11/21/1999    Endometrial polyps  . Eye surgery Right 03/2006    Cataract surgery  . Eye surgery  11/2004    Lid surgery   Her Family History Is Significant For: Family History  Problem Relation Age of Onset  . Arthritis Mother   . Kidney nephrosis Mother   . Ulcers Mother     Peptic  . Heart failure Mother     CHF  . Cancer Father     Kidney  . Early death Son     Self-inflicted gunshot   Her Social History Is Significant For: History   Social History  . Marital Status: Widowed    Spouse Name: N/A    Number of Children: N/A  . Years of Education: N/A   Social History Main Topics  . Smoking status: Former Smoker -- 2.00 packs/day for 35 years    Types: Cigarettes    Quit date: 04/23/1986  . Smokeless tobacco: Never Used  . Alcohol Use: No  . Drug Use: No  . Sexually Active: Not Currently   Other Topics Concern  . Not on file   Social History Narrative   Widowed in 2012.  Lives alone in [place].  Independent @ home.  Has been struggling with loss of husband.  Dtr near by.  Exercises in pulmonary rehab 2x/wk.    Her Allergies Are:  Allergies  Allergen Reactions  . Penicillins Hives and Rash  . Erythromycin Hives  . Neomycin-Bacitracin Zn-Polymyx Rash  . Sulfonamide Derivatives Hives and Rash  . Wool Alcohol (Lanolin Alcohol) Hives  . Nitrofurantoin Other (See Comments)    Unknown   . Nitrofurantoin Monohyd Macro Other (See Comments)    Unknown    Her Current Medications Are:  Outpatient Encounter Prescriptions as of 07/22/2012  Medication Sig Dispense Refill  . Calcium Carbonate (CALCIUM 600 PO) Take 1 tablet by mouth daily.       . Cholecalciferol (VITAMIN D PO) Take 2,000 mg by mouth daily.        . citalopram (CELEXA) 40 MG tablet Take 20 mg by mouth  daily.       . Flaxseed, Linseed, (FLAX SEED OIL) 1000 MG CAPS Take 1,000 mg by mouth 3 (three) times daily.       Marland Kitchen glipiZIDE (GLUCOTROL) 10 MG tablet Take 10 mg by mouth daily.      . Multiple Vitamin (MULTIVITAMIN PO) Take 1 tablet by mouth daily after breakfast.       . Omega-3 Fatty Acids (FISH OIL) 1200 MG CAPS Take 1 capsule by mouth daily.       . vitamin C (ASCORBIC ACID) 500  MG tablet Take 500 mg by mouth daily.        Carlena Hurl 20 MG TABS TAKE 1 TABLET BY MOUTH DAILY  90 tablet  1  . Glucosamine-Chondroitin (RA GLUCOSAMINE-CHONDROITIN) 750-600 MG TABS Take 1 tablet by mouth daily.       Marland Kitchen LANOXIN 0.25 MG tablet TAKE 1 TABLET BY MOUTH DAILY  90 tablet  1  . metoprolol (LOPRESSOR) 50 MG tablet Take 25 mg by mouth 2 (two) times daily.       No facility-administered encounter medications on file as of 07/22/2012.    Review of Systems  Constitutional: Positive for activity change, appetite change, fatigue and unexpected weight change (Loss).  HENT: Positive for hearing loss and rhinorrhea.   Eyes: Positive for visual disturbance (Blurred vision).  Respiratory: Positive for shortness of breath.        Snoring  Musculoskeletal: Positive for arthralgias.       Aching muscles  Allergic/Immunologic: Positive for environmental allergies.  Neurological: Positive for dizziness, syncope and weakness.  Hematological: Bruises/bleeds easily.       Easy bleeding  Psychiatric/Behavioral: Positive for sleep disturbance (not enough sleep) and dysphoric mood.    Objective:  Neurologic Exam  Physical Exam Physical Examination:   Filed Vitals:   07/22/12 1309  BP: 128/64  Pulse: 56  Temp: 97.8 F (36.6 C)   She does not have any orthostatic blood pressure vitals but does report lightheadedness upon standing and when sitting up quickly. When sitting up quickly from a lying position she also reports vertiginous symptoms with the room spinning but no tinnitus.  General Examination: The  patient is a very pleasant 77 y.o. female in no acute distress.  HEENT: Normocephalic, atraumatic, pupils are equal, round and reactive to light and accommodation. Funduscopic exam is normal with sharp disc margins noted. Extraocular tracking is good without nystagmus noted. Normal smooth pursuit is noted. Hearing is grossly intact. Tympanic membranes are clear bilaterally. Face is symmetric with normal facial animation and normal facial sensation. Speech is clear with no dysarthria noted. There is no hypophonia. There is no lip, neck or jaw tremor. Neck is supple with full range of motion. There are no carotid bruits on auscultation. Oropharynx exam reveals normal findings. No significant airway crowding is noted. Mallampati is class II. Tongue protrudes centrally and palate elevates symmetrically. Tonsils are 1+. She reports mild vertiginous symptoms with a sense of room spinning when sitting up quickly from the lying position. She denies any tinnitus. Nylan Barany maneuver produces no nystagmus.  Chest: is clear to auscultation without wheezing, rhonchi or crackles noted.  Heart: sounds are irregularly irregular regular without murmurs, rubs or gallops noted.   Abdomen: is soft, non-tender and non-distended with normal bowel sounds appreciated on auscultation.  Extremities: There is no pitting edema in the distal lower extremities bilaterally. Pedal pulses are intact.  Skin: is warm and dry with no trophic changes noted.  Musculoskeletal: exam reveals no obvious joint deformities, tenderness or joint swelling or erythema.  Neurologically:  Mental status: The patient is awake, alert and oriented in all 4 spheres. Her memory, attention, language and knowledge are appropriate. There is no aphasia, agnosia, apraxia or anomia. Speech is clear with normal prosody and enunciation. Thought process is linear. Mood is congruent and affect is normal.  Cranial nerves are as described above under HEENT exam.  In addition, shoulder shrug is normal with equal shoulder height noted. Motor exam: Normal bulk, strength and tone is noted. There  is no drift, tremor or rebound. Romberg is negative. Reflexes are 2+ throughout. Fine motor skills are intact with normal finger taps, normal hand movements, normal rapid alternating patting, normal foot taps and normal foot agility.  Cerebellar testing shows no dysmetria or intention tremor on finger to nose testing. There is no truncal or gait ataxia.  Sensory exam is intact to light touch, pinprick, vibration, temperature sense in the upper and lower extremities.  Gait, station and balance: she is able to stand with a narrow base. No veering to one side is noted. No leaning to one side. Posture is age-appropriate. No problems turning are noted. Intact arm swing bilaterally. She uses a 4 pronged cane for longer distances and does well with that.           Assessment and Plan:    Assessment and Plan:  In summary, IZZABELLE BOULEY is a very pleasant 77 y.o.-year old female with a history of dizziness and vertigo. Her exam is fairly nonfocal at this time.  I had a long chat with the patient and her son about my findings and the fact that dizziness is most commonly a multifactorial problem. Contributing issues in her case her advanced age, diabetes with suboptimal glucose control, blood pressure fluctuations, heart arrhythmia history, possible dehydration or orthostatic drop in blood pressure values, inner ear problem with a report of vertigo symptoms and intermittent tinnitus on the left. We talked about medical treatments and non-pharmacological approaches. We talked maintaining a healthy lifestyle in general. I encouraged the patient to eat healthy, exercise daily and keep well hydrated, to keep a scheduled bedtime and wake time routine, to not skip any meals and eat healthy snacks in between meals and to have protein with every meal.   I recommended the following at this time:  I would like her to discuss with you additional blood work if you agree. I am not sure if she had recent thyroid function tested and her hemoglobin A1c checked. I would like for her to see an ear nose throat physician to make sure this is often an area of problem or true vertigo. She does report left-sided tinnitus intermittently in vertiginous symptoms. I would like to do carotid Doppler studies in the CT head since she cannot have an MRI of the brain. I answered all their questions today and the patient and her son were in agreement with the plan. I would like to see her back in 3 months, sooner if the need arises and encouraged them to call with any interim questions, concerns, problems or updates. I did not suggest any new medications and explained to her that there is not any particular medicine that would help in her case. I do want her to continue with physical therapy and using her cane and walker as needed. She is advised to change positions slowly.   Thank you very much for allowing me to participate in the care of this nice patient. If I can be of any further assistance to you please do not hesitate to call me at [phone].  Sincerely,   Huston Foley, MD, PhD

## 2012-07-29 ENCOUNTER — Ambulatory Visit (INDEPENDENT_AMBULATORY_CARE_PROVIDER_SITE_OTHER): Payer: Medicare Other | Admitting: Nurse Practitioner

## 2012-07-29 VITALS — BP 110/70 | HR 96 | Temp 97.9°F | Resp 14 | Ht 65.0 in | Wt 134.0 lb

## 2012-07-29 DIAGNOSIS — E119 Type 2 diabetes mellitus without complications: Secondary | ICD-10-CM

## 2012-07-29 DIAGNOSIS — I10 Essential (primary) hypertension: Secondary | ICD-10-CM

## 2012-07-29 DIAGNOSIS — F329 Major depressive disorder, single episode, unspecified: Secondary | ICD-10-CM

## 2012-07-29 NOTE — Progress Notes (Signed)
[name], female   DOB: 12-31-1935, 77 y.o.   MRN: [id number]   Allergies  Allergen Reactions  . Penicillins Hives and Rash  . Erythromycin Hives  . Neomycin-Bacitracin Zn-Polymyx Rash  . Sulfonamide Derivatives Hives and Rash  . Wool Alcohol (Lanolin Alcohol) Hives  . Nitrofurantoin Other (See Comments)    Unknown   . Nitrofurantoin Monohyd Macro Other (See Comments)    Unknown     Chief Complaint  Patient presents with  . Depression    follow up    HPI: Patient is a 77 y.o. female seen in the office today for depression follow up.  Pt was previously placed on celexa due to severe depression after the loss of her husband. At last routine visit she reported her mood was a lot better and was ready to try to wean off medication.  She has now decreased down to 10mg  daily   Review of Systems:  Review of Systems  Constitutional: Positive for weight loss (slight weight loss- reports she still has good appeitite. ). Negative for fever, chills and malaise/fatigue.  HENT: Negative.   Eyes: Negative.   Respiratory: Negative for cough and shortness of breath.   Cardiovascular: Negative for chest pain and leg swelling.  Gastrointestinal: Negative for nausea, vomiting, abdominal pain, diarrhea and constipation.  Genitourinary: Negative.   Musculoskeletal: Positive for myalgias (still having some pains in  left leg/hip).  Skin: Negative.   Neurological: Negative.  Negative for weakness.  Endo/Heme/Allergies: Negative.   Psychiatric/Behavioral: Negative for depression. The patient is not nervous/anxious and does not have insomnia.      Past Medical History  Diagnosis Date  . Hypertension   . Osteoarthritis   . Hypercholesterolemia   . Paroxysmal atrial fibrillation     CHADS2: 2; managed with rate control and anticoagulation.   . Syncope and collapse     When patient stands up real quickly  . Normal cardiac stress test     a.  lexiscan MV - EF 72%, No ischemia  .  Anemia     ? of lab error vs GI bleed  . COPD (chronic obstructive pulmonary disease)   . Weight loss   . Chronic anticoagulation     On Xarelto  . Pacemaker   . Pneumonia   . Exertional dyspnea   . Type II diabetes mellitus   . History of blood transfusion     "wasn't needed; had some bad lab results" (03-15-2012)  . Depression   . Prolonged grief reaction     "husband died 1 yr ago" (03/15/12)  . Urinary incontinence   . Dizziness and giddiness 07/22/2012  . Benign paroxysmal positional vertigo 07/22/2012  . Tinnitus of left ear 07/22/2012   Past Surgical History  Procedure Laterality Date  . Appendectomy  1952  . Cervical disc surgery  04/2009  . Hip arthroplasty  11/08/2011    Procedure: ARTHROPLASTY BIPOLAR HIP;  Surgeon: Shelda Pal, MD;  Location: [facility] OR;  Service: Orthopedics;  Laterality: Left;  . Insert / replace / remove pacemaker  03-15-12  . Tubal ligation  1974  . Cholecystectomy  1984  . Polypectomy  11/21/1999    Endometrial polyps  . Eye surgery Right 03/2006    Cataract surgery  . Eye surgery  11/2004    Lid surgery   Social History:   reports that she quit smoking about 26 years ago. Her smoking use included Cigarettes. She has a 70 pack-year smoking history. She  has never used smokeless tobacco. She reports that she does not drink alcohol or use illicit drugs.  Family History  Problem Relation Age of Onset  . Arthritis Mother   . Kidney nephrosis Mother   . Ulcers Mother     Peptic  . Heart failure Mother     CHF  . Cancer Father     Kidney  . Early death Son     Self-inflicted gunshot    Medications: Patient's Medications  New Prescriptions   No medications on file  Previous Medications   CALCIUM CARBONATE (CALCIUM 600 PO)    Take 1 tablet by mouth daily.    CHOLECALCIFEROL (VITAMIN D PO)    Take 2,000 mg by mouth daily.     FLAXSEED, LINSEED, (FLAX SEED OIL) 1000 MG CAPS    Take 1,000 mg by mouth 3 (three) times daily.    MULTIPLE VITAMIN  (MULTIVITAMIN PO)    Take 1 tablet by mouth daily after breakfast.    OMEGA-3 FATTY ACIDS (FISH OIL) 1200 MG CAPS    Take 1 capsule by mouth daily.    VITAMIN C (ASCORBIC ACID) 500 MG TABLET    Take 500 mg by mouth daily.     XARELTO 20 MG TABS    TAKE 1 TABLET BY MOUTH DAILY  Modified Medications   Modified Medication Previous Medication   LINAGLIPTIN (TRADJENTA) 5 MG TABS TABLET linagliptin (TRADJENTA) 5 MG TABS tablet      Take one tablet once a day for diabetes.    Take 1 tablet (5 mg total) by mouth daily.  Discontinued Medications   CITALOPRAM (CELEXA) 40 MG TABLET    Take 10 mg by mouth daily.    GLIPIZIDE (GLUCOTROL) 10 MG TABLET    Take 10 mg by mouth daily.   GLUCOSAMINE-CHONDROITIN (RA GLUCOSAMINE-CHONDROITIN) 750-600 MG TABS    Take 1 tablet by mouth daily.    LANOXIN 0.25 MG TABLET    TAKE 1 TABLET BY MOUTH DAILY   METOPROLOL (LOPRESSOR) 50 MG TABLET    Take 25 mg by mouth 2 (two) times daily.     Physical Exam: Physical Exam  Vitals reviewed. Constitutional: She is oriented to person, place, and time. She appears well-developed and well-nourished. No distress.  HENT:  Head: Normocephalic and atraumatic.  Eyes: EOM are normal. Pupils are equal, round, and reactive to light.  Neck: Normal range of motion. Neck supple.  Cardiovascular: Normal rate, regular rhythm and normal heart sounds.   Pulmonary/Chest: Effort normal and breath sounds normal.  Abdominal: Soft. Bowel sounds are normal.  Musculoskeletal:  Gait steady- using 4-prong cane   Neurological: She is alert and oriented to person, place, and time.  Skin: Skin is warm and dry.  Psychiatric: She has a normal mood and affect.  (type .physexam) Filed Vitals:   07/29/12 1623  BP: 110/70  Pulse: 96  Temp: 97.9 F (36.6 C)  TempSrc: Oral  Resp: 14  Height: 5\' 5"  (1.651 m)  Weight: 134 lb (60.782 kg)  SpO2: 99%      Labs reviewed: Basic Metabolic Panel:  Recent Labs  19/14/78 2254 11/08/11 0649   11/11/11 0603 01/16/12 1448 02/22/12 1142 07/29/12 1703  NA 143 141  < > 140 138 137 140  K 4.1 4.3  < > 3.4* 4.4 4.4 5.2  CL 103 104  < > 102 101 103 99  CO2 24 27  < > 27 29 25 25   GLUCOSE 95 120*  < >  95 219* 210* 167*  BUN 18 18  < > 14 24* 19 23  CREATININE 0.71 0.76  < > 0.63 0.8 0.9 1.02*  CALCIUM 10.8* 9.1  < > 8.6 9.2 9.2 10.7*  MG  --  1.4*  --  1.6  --   --   --   < > = values in this interval not displayed. Liver Function Tests:  Recent Labs  11/07/11 2254 11/08/11 0649  AST 36 170*  ALT 25 79*  ALKPHOS 75 73  BILITOT 0.4 0.5  PROT 7.2 6.0  ALBUMIN 4.0 3.2*   No results found for this basename: LIPASE, AMYLASE,  in the last 8760 hours No results found for this basename: AMMONIA,  in the last 8760 hours CBC:  Recent Labs  11/11/11 0603 01/16/12 1448 02/22/12 1142  WBC 13.6* 9.6 10.5  NEUTROABS  --  6.2 7.5  HGB 10.2* 13.3 13.6  HCT 30.1* 41.2 42.4  MCV 88.5 89.5 89.4  PLT 186 268.0 233.0    Assessment/Plan HYPERTENSION Patient is stable; continue current regimen. Will monitor and make changes as necessary.   DIABETES MELLITUS, TYPE II Fasting blood sugars ranging in the 120s- occasionally fasting blood sugars 150s. Will check A1c today  Depression May stop celexa- follow up as needed if feeling any increase sadness or anxiety.

## 2012-07-29 NOTE — Patient Instructions (Addendum)
May stop celexa- follow up as needed if feeling any increase sadness or anxiety.   

## 2012-07-30 LAB — BASIC METABOLIC PANEL
BUN/Creatinine Ratio: 23 (ref 11–26)
BUN: 23 mg/dL (ref 8–27)
CO2: 25 mmol/L (ref 19–28)
Creatinine, Ser: 1.02 mg/dL — ABNORMAL HIGH (ref 0.57–1.00)
GFR calc Af Amer: 62 mL/min/{1.73_m2} (ref >59)

## 2012-07-30 LAB — HEMOGLOBIN A1C: Hgb A1c MFr Bld: 8.5 % — ABNORMAL HIGH (ref 4.8–5.6)

## 2012-07-31 ENCOUNTER — Other Ambulatory Visit: Payer: Self-pay | Admitting: *Deleted

## 2012-07-31 ENCOUNTER — Other Ambulatory Visit: Payer: Self-pay | Admitting: Nurse Practitioner

## 2012-07-31 DIAGNOSIS — E119 Type 2 diabetes mellitus without complications: Secondary | ICD-10-CM

## 2012-07-31 MED ORDER — LINAGLIPTIN 5 MG PO TABS: ORAL_TABLET | ORAL | Status: DC

## 2012-07-31 MED ORDER — LINAGLIPTIN 5 MG PO TABS: 5.0000 mg | ORAL_TABLET | Freq: Every day | ORAL | Status: DC

## 2012-07-31 NOTE — Progress Notes (Signed)
Quick Note:  A1C is worse this indicates blood sugars after she eats are too high since her fasting blood sugars were not bad- will have her stop glipizide and start tradjenta 5mg  daily by mouth. Will follow up A1c at next visit in 3 months ______

## 2012-08-01 ENCOUNTER — Ambulatory Visit
Admission: RE | Admit: 2012-08-01 | Discharge: 2012-08-01 | Disposition: A | Discharge status: Home / Self Care | Payer: Medicare Other | Source: Ambulatory Visit | Attending: Neurology | Admitting: Neurology

## 2012-08-01 ENCOUNTER — Ambulatory Visit (INDEPENDENT_AMBULATORY_CARE_PROVIDER_SITE_OTHER): Payer: Medicare Other

## 2012-08-01 DIAGNOSIS — H9312 Tinnitus, left ear: Secondary | ICD-10-CM

## 2012-08-01 DIAGNOSIS — H8110 Benign paroxysmal vertigo, unspecified ear: Secondary | ICD-10-CM

## 2012-08-01 DIAGNOSIS — R42 Dizziness and giddiness: Secondary | ICD-10-CM

## 2012-08-04 ENCOUNTER — Other Ambulatory Visit: Payer: Self-pay | Admitting: *Deleted

## 2012-08-04 DIAGNOSIS — E119 Type 2 diabetes mellitus without complications: Secondary | ICD-10-CM | POA: Insufficient documentation

## 2012-08-04 DIAGNOSIS — F329 Major depressive disorder, single episode, unspecified: Secondary | ICD-10-CM | POA: Insufficient documentation

## 2012-08-04 NOTE — Assessment & Plan Note (Signed)
May stop celexa- follow up as needed if feeling any increase sadness or anxiety.

## 2012-08-04 NOTE — Assessment & Plan Note (Signed)
Fasting blood sugars ranging in the 120s- occasionally fasting blood sugars 150s. Will check A1c today

## 2012-08-04 NOTE — Assessment & Plan Note (Signed)
Patient is stable; continue current regimen. Will monitor and make changes as necessary.  

## 2012-08-05 ENCOUNTER — Other Ambulatory Visit: Payer: Self-pay | Admitting: Geriatric Medicine

## 2012-08-05 DIAGNOSIS — E119 Type 2 diabetes mellitus without complications: Secondary | ICD-10-CM

## 2012-08-05 MED ORDER — LINAGLIPTIN 5 MG PO TABS: ORAL_TABLET | ORAL | Status: DC

## 2012-08-06 NOTE — Progress Notes (Signed)
Quick Note:  Spoke with patient's son Taylour Lietzke) and relayed results of his mother's brain scan. Mr Mccravy understood and had no questions; he was also given his mothers f/u appointment information. ______

## 2012-08-06 NOTE — Progress Notes (Signed)
Quick Note:  Please call and advise the patient that the recent brain scan we did showed findings in keeping with her age: there was mild shrinkage of brain volume, which we call atrophy, but not advanced or severe and there were age appropriate change in the blood vessels, which we call microvascular changes, nothing that in isolation would explain her dizziness. Please remind patient to keep any upcoming appointments and call with any interim questions, concerns, problems or updates. Thanks,  Huston Foley, MD, PhD  ______

## 2012-08-13 ENCOUNTER — Ambulatory Visit (INDEPENDENT_AMBULATORY_CARE_PROVIDER_SITE_OTHER): Payer: Medicare Other | Admitting: Nurse Practitioner

## 2012-08-13 ENCOUNTER — Encounter: Payer: Self-pay | Admitting: Nurse Practitioner

## 2012-08-13 VITALS — BP 118/70 | HR 64 | Temp 97.7°F | Resp 18 | Ht 65.5 in | Wt 139.0 lb

## 2012-08-13 DIAGNOSIS — F329 Major depressive disorder, single episode, unspecified: Secondary | ICD-10-CM

## 2012-08-13 DIAGNOSIS — E119 Type 2 diabetes mellitus without complications: Secondary | ICD-10-CM

## 2012-08-13 DIAGNOSIS — G4700 Insomnia, unspecified: Secondary | ICD-10-CM

## 2012-08-13 MED ORDER — MIRTAZAPINE 7.5 MG PO TABS: 7.5000 mg | ORAL_TABLET | Freq: Every day | ORAL | Status: DC

## 2012-08-13 MED ORDER — LINAGLIPTIN 5 MG PO TABS: ORAL_TABLET | ORAL | Status: DC

## 2012-08-13 NOTE — Progress Notes (Signed)
[name], female   DOB: 1936-02-26, 77 y.o.   MRN: [id number]  Allergies  Allergen Reactions  . Penicillins Hives and Rash  . Erythromycin Hives  . Neomycin-Bacitracin Zn-Polymyx Rash  . Sulfonamide Derivatives Hives and Rash  . Wool Alcohol (Lanolin Alcohol) Hives  . Nitrofurantoin Other (See Comments)    Unknown   . Nitrofurantoin Monohyd Macro Other (See Comments)    Unknown     Chief Complaint  Patient presents with  . follow up    medications and sleep    HPI: Patient is a 77 y.o. female seen in the office today due to in abitly to sleep at night which has been ongoing and would like to try something to help this. Reports she can not fall asleep. Tried melatonin in the past that did not help.  Also with decreased appetite that has been ongoing Doing well off of celexa. No increase in depression, no irritability or increased in agiatation. participating with a group of women who have lost their husbands  Dr Graciela Husbands took her off all cardiac medications. Has been doing well with this. BP good today Has not started taking tradjenta because she is awaiting to get it from her [facility] day pharmacy- did not want to pay co-pay from other pharmacy  Review of Systems:  Review of Systems  Constitutional: Positive for weight loss. Negative for fever and chills.  Respiratory: Negative for shortness of breath.   Cardiovascular: Negative for chest pain and palpitations.  Psychiatric/Behavioral: Negative for depression and suicidal ideas. The patient has insomnia. The patient is not nervous/anxious.      Past Medical History  Diagnosis Date  . Hypertension   . Osteoarthritis   . Hypercholesterolemia   . Paroxysmal atrial fibrillation     CHADS2: 2; managed with rate control and anticoagulation.   . Syncope and collapse     When patient stands up real quickly  . Normal cardiac stress test     a.  lexiscan MV - EF 72%, No ischemia  . Anemia     ? of lab error vs GI bleed  .  COPD (chronic obstructive pulmonary disease)   . Weight loss   . Chronic anticoagulation     On Xarelto  . Pacemaker   . Pneumonia   . Exertional dyspnea   . Type II diabetes mellitus   . History of blood transfusion     "wasn't needed; had some bad lab results" (03-19-2012)  . Depression   . Prolonged grief reaction     "husband died 1 yr ago" (03/19/12)  . Urinary incontinence   . Dizziness and giddiness 07/22/2012  . Benign paroxysmal positional vertigo 07/22/2012  . Tinnitus of left ear 07/22/2012   Past Surgical History  Procedure Laterality Date  . Appendectomy  1952  . Cervical disc surgery  04/2009  . Hip arthroplasty  11/08/2011    Procedure: ARTHROPLASTY BIPOLAR HIP;  Surgeon: Shelda Pal, MD;  Location: [facility] OR;  Service: Orthopedics;  Laterality: Left;  . Insert / replace / remove pacemaker  2012/03/19  . Tubal ligation  1974  . Cholecystectomy  1984  . Polypectomy  11/21/1999    Endometrial polyps  . Eye surgery Right 03/2006    Cataract surgery  . Eye surgery  11/2004    Lid surgery   Social History:   reports that she quit smoking about 26 years ago. Her smoking use included Cigarettes. She has a 70 pack-year smoking history.  She has never used smokeless tobacco. She reports that she does not drink alcohol or use illicit drugs.  Family History  Problem Relation Age of Onset  . Arthritis Mother   . Kidney nephrosis Mother   . Ulcers Mother     Peptic  . Heart failure Mother     CHF  . Cancer Father     Kidney  . Early death Son     Self-inflicted gunshot    Medications: Patient's Medications  New Prescriptions   No medications on file  Previous Medications   CALCIUM CARBONATE (CALCIUM 600 PO)    Take 1 tablet by mouth daily.    CHOLECALCIFEROL (VITAMIN D PO)    Take 2,000 mg by mouth daily.     FLAXSEED, LINSEED, (FLAX SEED OIL) 1000 MG CAPS    Take 1,000 mg by mouth 3 (three) times daily.    GLIPIZIDE (GLUCOTROL) 10 MG TABLET    Take 10 mg by mouth 2  (two) times daily before a meal. Take one tablet before breakfast to control diabetes.   MULTIPLE VITAMIN (MULTIVITAMIN PO)    Take 1 tablet by mouth daily after breakfast.    OMEGA-3 FATTY ACIDS (FISH OIL) 1200 MG CAPS    Take 1 capsule by mouth daily.    VITAMIN C (ASCORBIC ACID) 500 MG TABLET    Take 500 mg by mouth daily.     XARELTO 20 MG TABS    TAKE 1 TABLET BY MOUTH DAILY  Modified Medications   No medications on file  Discontinued Medications   LINAGLIPTIN (TRADJENTA) 5 MG TABS TABLET    Take one tablet once a day for diabetes.     Physical Exam: Physical Exam  Constitutional: No distress.  HENT:  Head: Normocephalic and atraumatic.  Cardiovascular: Normal rate and normal heart sounds.   irregularly irregular   Pulmonary/Chest: Effort normal and breath sounds normal.  Abdominal: Soft. Bowel sounds are normal.  Musculoskeletal: She exhibits no edema.  Skin: Skin is warm and dry. She is not diaphoretic.  Psychiatric: She has a normal mood and affect. Her behavior is normal. Thought content normal.   Filed Vitals:   08/13/12 1422  BP: 118/70  Pulse: 64  Temp: 97.7 F (36.5 C)  TempSrc: Oral  Resp: 18  Height: 5' 5.5" (1.664 m)  Weight: 139 lb (63.05 kg)  SpO2: 98%      Labs reviewed: Basic Metabolic Panel:  Recent Labs  40/98/11 2254 11/08/11 0649  11/11/11 0603 01/16/12 1448 02/22/12 1142 07/29/12 1703  NA 143 141  < > 140 138 137 140  K 4.1 4.3  < > 3.4* 4.4 4.4 5.2  CL 103 104  < > 102 101 103 99  CO2 24 27  < > 27 29 25 25   GLUCOSE 95 120*  < > 95 219* 210* 167*  BUN 18 18  < > 14 24* 19 23  CREATININE 0.71 0.76  < > 0.63 0.8 0.9 1.02*  CALCIUM 10.8* 9.1  < > 8.6 9.2 9.2 10.7*  MG  --  1.4*  --  1.6  --   --   --   < > = values in this interval not displayed. Liver Function Tests:  Recent Labs  11/07/11 2254 11/08/11 0649  AST 36 170*  ALT 25 79*  ALKPHOS 75 73  BILITOT 0.4 0.5  PROT 7.2 6.0  ALBUMIN 4.0 3.2*   No results found for  this basename: LIPASE, AMYLASE,  in  the last 8760 hours No results found for this basename: AMMONIA,  in the last 8760 hours CBC:  Recent Labs  11/11/11 0603 01/16/12 1448 02/22/12 1142  WBC 13.6* 9.6 10.5  NEUTROABS  --  6.2 7.5  HGB 10.2* 13.3 13.6  HCT 30.1* 41.2 42.4  MCV 88.5 89.5 89.4  PLT 186 268.0 233.0    Assessment/Plan Insomnia-  unchanged with history of weight loss and depression will start remeron 7.5mg  qhs  Diabetes-  Pt will stay on glipizide until tradjenta comes in from pharmacy then she will make 3 month follow up with A1c before visit.   Depression/anxiety- Stable off celexa

## 2012-08-13 NOTE — Patient Instructions (Signed)

## 2012-08-20 ENCOUNTER — Other Ambulatory Visit: Payer: Self-pay | Admitting: *Deleted

## 2012-08-20 DIAGNOSIS — E119 Type 2 diabetes mellitus without complications: Secondary | ICD-10-CM

## 2012-08-20 MED ORDER — SITAGLIPTIN PHOSPHATE 100 MG PO TABS: ORAL_TABLET | ORAL | Status: DC

## 2012-08-22 ENCOUNTER — Other Ambulatory Visit: Payer: Self-pay | Admitting: *Deleted

## 2012-08-22 MED ORDER — LINAGLIPTIN 5 MG PO TABS: ORAL_TABLET | ORAL | Status: DC

## 2012-08-22 MED ORDER — LINAGLIPTIN 5 MG PO TABS: 5.0000 mg | ORAL_TABLET | Freq: Every day | ORAL | Status: DC

## 2012-08-23 ENCOUNTER — Other Ambulatory Visit: Payer: Self-pay | Admitting: Internal Medicine

## 2012-08-27 ENCOUNTER — Encounter: Payer: Self-pay | Admitting: Cardiology

## 2012-08-27 ENCOUNTER — Telehealth: Payer: Self-pay | Admitting: *Deleted

## 2012-08-27 ENCOUNTER — Telehealth: Payer: Self-pay | Admitting: Geriatric Medicine

## 2012-08-27 ENCOUNTER — Ambulatory Visit (INDEPENDENT_AMBULATORY_CARE_PROVIDER_SITE_OTHER): Payer: Medicare Other | Admitting: Cardiology

## 2012-08-27 VITALS — BP 124/74 | HR 62 | Ht 65.5 in | Wt 141.1 lb

## 2012-08-27 DIAGNOSIS — I4891 Unspecified atrial fibrillation: Secondary | ICD-10-CM

## 2012-08-27 DIAGNOSIS — F329 Major depressive disorder, single episode, unspecified: Secondary | ICD-10-CM

## 2012-08-27 DIAGNOSIS — I495 Sick sinus syndrome: Secondary | ICD-10-CM

## 2012-08-27 NOTE — Telephone Encounter (Signed)
Spoke with patient on Wednesday 20 August 2012. I called her son and spoke

## 2012-08-27 NOTE — Patient Instructions (Addendum)
RESTART YOUR METOPROLOL 50 MG AT 1/2 TABLET IN THE MORNING  Your physician recommends that you schedule a follow-up appointment in: 3 MONTH OV/EKG

## 2012-08-27 NOTE — Assessment & Plan Note (Signed)
Her heart rate today is rapid and irregular.  Her blood pressure is not too high so we will restart her beta blocker gently.  She has Lopressor 50 mg tablets on hand at home and we will have her stay just half a tablet once a day initially.  She will take in the morning

## 2012-08-27 NOTE — Telephone Encounter (Signed)
Called emergency contact because that's  the number that is in the system first. It is patient son Erin Vang, I was talking with him about his mother insurance would not cover Trajenta 5 MG once a day for her diabetes. They stated that she must try Januvia, Janumet, or Januset first. James(son) started going off on me about [place] not wanting to fight for his mother and we need to, he would not let me get a word in other wise. I was able to ask him if his mother never tried Trajenta 5 MG why was it so important that she takes it. He hung up in my face.  I called the  patient she understood what the insurance company stated.  On Aug 21 2012 for some old reason the Trajenta 5 MG was being paid by the patient insurance company. I called patient to let her know. Patient said okay and asked me to phone it into [facility]. She told me that her son Erin Vang told her how rude he was to me and what he said. Patient apologized for her son behavior and asked me to( Never speak to her son Erin Vang about her nor her health.)

## 2012-08-27 NOTE — Assessment & Plan Note (Signed)
The patient has tachybradycardia syndrome and has an implanted pacemaker.  She has not been loading any information from home concerning her pacemaker.  We will contact the pacemaker lab to be sure that she is being followed as per protocol.

## 2012-08-27 NOTE — Assessment & Plan Note (Signed)
Her symptoms of depression have improved. 

## 2012-08-27 NOTE — Progress Notes (Signed)
Erin Vang Date of Birth:  1935/10/20 [facility] [address] [place], [place]  [address] [phone]         Fax   [phone]  History of Present Illness: This pleasant 76 year old woman is seen for a scheduled followup office visit. She has a past history of tachybradycardia syndrome with chronic atrial fibrillation and is on Xarelto. She has a permanent pacemaker implanted by Dr. Graciela Husbands. Since the implantation of the pacemaker had no further dizzy spells or syncope.  She is diabetic and has had some problems with hypoglycemia and this is followed by Dr. Murray Hodgkins.  The patient has not been taking any AV blocking drugs in her heart rate today is elevated at 136.   Current Outpatient Prescriptions  Medication Sig Dispense Refill  . Calcium Carbonate (CALCIUM 600 PO) Take 1 tablet by mouth daily.       . Cholecalciferol (VITAMIN D PO) Take 2,000 mg by mouth daily.        . Flaxseed, Linseed, (FLAX SEED OIL) 1000 MG CAPS Take 1,000 mg by mouth 3 (three) times daily.       Marland Kitchen linagliptin (TRADJENTA) 5 MG TABS tablet Take one tablet once a day for diabetes 250.00  90 tablet  3  . metoprolol (LOPRESSOR) 50 MG tablet Take 50 mg by mouth as directed. 1/2 TABLET EVERY MORNING      . mirtazapine (REMERON) 7.5 MG tablet Take 1 tablet (7.5 mg total) by mouth at bedtime.  90 tablet  0  . Multiple Vitamin (MULTIVITAMIN PO) Take 1 tablet by mouth daily after breakfast.       . Omega-3 Fatty Acids (FISH OIL) 1200 MG CAPS Take 1 capsule by mouth daily.       . vitamin C (ASCORBIC ACID) 500 MG tablet Take 500 mg by mouth daily.        Erin Vang 20 MG TABS TAKE 1 TABLET BY MOUTH DAILY  90 tablet  1  . glipiZIDE (GLUCOTROL) 10 MG tablet TAKE 1 TABLET BEFORE BREAKFAST TO CONTROL DIABETES  90 tablet  2   No current facility-administered medications for this visit.    Allergies  Allergen Reactions  . Penicillins Hives and Rash  . Erythromycin Hives  . Neomycin-Bacitracin  Zn-Polymyx Rash  . Sulfonamide Derivatives Hives and Rash  . Wool Alcohol (Lanolin Alcohol) Hives  . Nitrofurantoin Other (See Comments)    Unknown   . Nitrofurantoin Monohyd Macro Other (See Comments)    Unknown     Patient Active Problem List   Diagnosis Date Noted  . Insomnia 08/13/2012  . Diabetes 08/04/2012  . Depression 08/04/2012  . Dizziness and giddiness 07/22/2012  . Benign paroxysmal positional vertigo 07/22/2012  . Tinnitus of left ear 07/22/2012  . Tachy-brady syndrome 03/01/2012  . Pacemaker-[employer] 02/29/2012  . Closed left hip fracture 11/08/2011  . Reactive depression 05/25/2011  . Anorexia 05/25/2011  . Anemia 05/25/2011  . Weight loss, non-intentional 05/25/2011  . Bradycardia 05/25/2011  . Constipation, chronic 05/25/2011  . Normal cardiac stress test   . Atrial fibrillation   . COPD (chronic obstructive pulmonary disease) 10/10/2010  . Benign hypertensive heart disease without heart failure 09/11/2010  . Osteoarthritis   . Hypercholesterolemia   . ALLERGIC RHINITIS 07/26/2009  . DIABETES MELLITUS, TYPE II 07/25/2009  . ANXIETY 07/25/2009  . HYPERTENSION 07/25/2009  . DYSPNEA 07/25/2009    History  Smoking status  . Former Smoker -- 2.00 packs/day for 35 years  .  Types: Cigarettes  . Quit date: 04/23/1986  Smokeless tobacco  . Never Used    History  Alcohol Use No    Family History  Problem Relation Age of Onset  . Arthritis Mother   . Kidney nephrosis Mother   . Ulcers Mother     Peptic  . Heart failure Mother     CHF  . Cancer Father     Kidney  . Early death Son     Self-inflicted gunshot    Review of Systems: Constitutional: no fever chills diaphoresis or fatigue or change in weight.  Head and neck: no hearing loss, no epistaxis, no photophobia or visual disturbance. Respiratory: No cough, shortness of breath or wheezing. Cardiovascular: No chest pain peripheral edema, palpitations. Gastrointestinal: No abdominal  distention, no abdominal pain, no change in bowel habits hematochezia or melena. Genitourinary: No dysuria, no frequency, no urgency, no nocturia. Musculoskeletal:No arthralgias, no back pain, no gait disturbance or myalgias. Neurological: No dizziness, no headaches, no numbness, no seizures, no syncope, no weakness, no tremors. Hematologic: No lymphadenopathy, no easy bruising. Psychiatric: No confusion, no hallucinations, no sleep disturbance.    Physical Exam: Filed Vitals:   08/27/12 1557  BP: 124/74  Pulse: 62   Her pulse by EKG is 136.  The general appearance reveals a well-developed well-nourished woman in no distress.  She has extensive ecchymosis on the left side of her face resulting from a fall caused by hypoglycemic episode and she is on long-term Xarelto.  The bruise is resolving appropriately.The head and neck exam reveals pupils equal and reactive.  Extraocular movements are full.  There is no scleral icterus.  The mouth and pharynx are normal.  The neck is supple.  The carotids reveal no bruits.  The jugular venous pressure is normal.  The  thyroid is not enlarged.  There is no lymphadenopathy.  The chest is clear to percussion and auscultation.  There are no rales or rhonchi.  Expansion of the chest is symmetrical.  The pulse is rapid and irregular.  The precordium is quiet.  The first heart sound is normal.  The second heart sound is physiologically split.  There is no murmur gallop rub or click.  There is no abnormal lift or heave.  The abdomen is soft and nontender.  The bowel sounds are normal.  The liver and spleen are not enlarged.  There are no abdominal masses.  There are no abdominal bruits.  Extremities reveal good pedal pulses.  There is no phlebitis or edema.  There is no cyanosis or clubbing.  Strength is normal and symmetrical in all extremities.  There is no lateralizing weakness.  There are no sensory deficits.  The skin is warm and dry.  There is no rash.  EKG shows  atrial fibrillation with rapid ventricular response and widespread nonspecific ST-T wave abnormalities.  Assessment / Plan:  Resume low-dose beta blocker in the form of half of a 50 mg generic Lopressor once a day.  Recheck here in 3 months for followup office visit and EKG.

## 2012-08-27 NOTE — Telephone Encounter (Signed)
Called to speak to patient regarding a medication concern. Her son answered the phone. He is listed as an emergency contact, but not someone that we can speak to as far as the patient's healthcare is concerned. He was asking me questions and I kept telling him that I needed to speak with the patient and I was not able to talk to him due to privacy issues. He was not accepting that at all and he kept asking me questions. Finally, I said I am sorry I cannot talk to you. I told him I had to go and I would contact the patient. I then hung up the phone.

## 2012-09-17 ENCOUNTER — Other Ambulatory Visit: Payer: Self-pay | Admitting: Geriatric Medicine

## 2012-09-17 MED ORDER — LANCETS THIN MISC: Status: DC

## 2012-10-03 ENCOUNTER — Encounter: Payer: Self-pay | Admitting: *Deleted

## 2012-10-06 ENCOUNTER — Ambulatory Visit (INDEPENDENT_AMBULATORY_CARE_PROVIDER_SITE_OTHER): Payer: Medicare Other | Admitting: Nurse Practitioner

## 2012-10-06 ENCOUNTER — Encounter: Payer: Self-pay | Admitting: Nurse Practitioner

## 2012-10-06 ENCOUNTER — Ambulatory Visit
Admission: RE | Admit: 2012-10-06 | Discharge: 2012-10-06 | Disposition: A | Discharge status: Home / Self Care | Payer: Medicare Other | Source: Ambulatory Visit | Attending: Nurse Practitioner | Admitting: Nurse Practitioner

## 2012-10-06 VITALS — BP 110/72 | HR 100 | Temp 98.0°F | Resp 13 | Ht 65.5 in | Wt 145.4 lb

## 2012-10-06 DIAGNOSIS — M25542 Pain in joints of left hand: Secondary | ICD-10-CM

## 2012-10-06 DIAGNOSIS — E119 Type 2 diabetes mellitus without complications: Secondary | ICD-10-CM

## 2012-10-06 DIAGNOSIS — M79609 Pain in unspecified limb: Secondary | ICD-10-CM

## 2012-10-06 DIAGNOSIS — R634 Abnormal weight loss: Secondary | ICD-10-CM

## 2012-10-06 NOTE — Patient Instructions (Addendum)
To follow up in 1 month for follow up diabetes with lab work before Will need A1c and BMP before visit  Cont taking blood sugars in the morning before you have eaten START taking blood sugars before 1 meal daily  Can stop remeron will evaluate weight at next visit

## 2012-10-06 NOTE — Progress Notes (Signed)
[name], female   DOB: Jan 26, 1936, 77 y.o.   MRN: [id number]   Allergies  Allergen Reactions  . Penicillins Hives and Rash  . Erythromycin Hives  . Neomycin-Bacitracin Zn-Polymyx Rash  . Sulfonamide Derivatives Hives and Rash  . Wool Alcohol (Lanolin Alcohol) Hives  . Nitrofurantoin Other (See Comments)    Unknown   . Nitrofurantoin Monohyd Macro Other (See Comments)    Unknown   . Sulfa Antibiotics     Chief Complaint  Patient presents with  . Hyperglycemia    Complains of passing out and falling on thumb, thinks its broken    HPI: Patient is a 77 y.o. female seen in the office today for high blood sugars and fall which resulted in hurt finger  Reports she bent over to get something under the counter and passed out. This episode happened 2 months ago.  Hit head and finger. Had a knot on her head which has gotten better. Thumb still hurting; she is unable to button her night gown or pick things up. Somewhat able to make a fist however she is not able to grasp things appropriately. Reports pain and weakness is getting worse.   Had an appt with Dr Patty Sermons a few days afterwards   Reports blood sugars have been getting worse per pt --log shows fasting blood sugars between 112 -150  Pt also taking Remeron due to weight loss and sleep-- would like to quit taking this medication because it is not helping with sleep and she has gained weight and would not like to gain any more.  Review of Systems:  Review of Systems  Constitutional: Negative for fever, chills and malaise/fatigue.  HENT: Negative for tinnitus.   Eyes: Negative for blurred vision.  Cardiovascular: Negative for chest pain and palpitations.  Musculoskeletal: Positive for joint pain (left thumb ).  Neurological: Negative for dizziness, tingling, loss of consciousness, weakness and headaches.     Past Medical History  Diagnosis Date  . Hypertension   . Osteoarthritis   . Hypercholesterolemia   .  Paroxysmal atrial fibrillation     CHADS2: 2; managed with rate control and anticoagulation.   . Syncope and collapse     When patient stands up real quickly  . Normal cardiac stress test     a.  lexiscan MV - EF 72%, No ischemia  . Anemia     ? of lab error vs GI bleed  . COPD (chronic obstructive pulmonary disease)   . Weight loss   . Chronic anticoagulation     On Xarelto  . Pacemaker   . Pneumonia   . Exertional dyspnea   . Type II diabetes mellitus   . History of blood transfusion     "wasn't needed; had some bad lab results" (2012-03-11)  . Depression   . Prolonged grief reaction     "husband died 1 yr ago" (2012-03-11)  . Urinary incontinence   . Dizziness and giddiness 07/22/2012  . Benign paroxysmal positional vertigo 07/22/2012  . Tinnitus of left ear 07/22/2012  . Unspecified constipation   . Insomnia, unspecified   . Anxiety state, unspecified    Past Surgical History  Procedure Laterality Date  . Appendectomy  1952  . Cervical disc surgery  04/2009  . Hip arthroplasty  11/08/2011    Procedure: ARTHROPLASTY BIPOLAR HIP;  Surgeon: Shelda Pal, MD;  Location: [facility] OR;  Service: Orthopedics;  Laterality: Left;  . Insert / replace / remove pacemaker  Mar 11, 2012  .  Tubal ligation  1974  . Cholecystectomy  1984  . Polypectomy  11/21/1999    Endometrial polyps  . Eye surgery Right 03/2006    Cataract surgery  . Eye surgery  11/2004    Lid surgery   Social History:   reports that she quit smoking about 26 years ago. Her smoking use included Cigarettes. She has a 70 pack-year smoking history. She has never used smokeless tobacco. She reports that she does not drink alcohol or use illicit drugs.  Family History  Problem Relation Age of Onset  . Arthritis Mother   . Kidney nephrosis Mother   . Ulcers Mother     Peptic  . Heart failure Mother     CHF  . Cancer Father     Kidney  . Early death Son     Self-inflicted gunshot    Medications: Patient's Medications  New  Prescriptions   No medications on file  Previous Medications   CALCIUM CARBONATE (CALCIUM 600 PO)    Take 1 tablet by mouth daily.    CHOLECALCIFEROL (VITAMIN D PO)    Take 2,000 mg by mouth daily.     FLAXSEED, LINSEED, (FLAX SEED OIL) 1000 MG CAPS    Take 1,000 mg by mouth 3 (three) times daily.    LANCETS THIN MISC    Use three times daily to check blood sugar daily to control diabetes. 250.00   LINAGLIPTIN (TRADJENTA) 5 MG TABS TABLET    Take one tablet once a day for diabetes 250.00   METOPROLOL TARTRATE (LOPRESSOR) 25 MG TABLET    Take one tablet once daily for blood pressure   MIRTAZAPINE (REMERON) 7.5 MG TABLET    Take 1 tablet (7.5 mg total) by mouth at bedtime.   MULTIPLE VITAMIN (MULTIVITAMIN PO)    Take 1 tablet by mouth daily after breakfast.    VITAMIN C (ASCORBIC ACID) 500 MG TABLET    Take 500 mg by mouth daily.     XARELTO 20 MG TABS    TAKE 1 TABLET BY MOUTH DAILY  Modified Medications   No medications on file  Discontinued Medications   GLIPIZIDE (GLUCOTROL) 10 MG TABLET    TAKE 1 TABLET BEFORE BREAKFAST TO CONTROL DIABETES   METOPROLOL (LOPRESSOR) 50 MG TABLET    Take 50 mg by mouth as directed. 1/2 TABLET EVERY MORNING   OMEGA-3 FATTY ACIDS (FISH OIL) 1200 MG CAPS    Take 1 capsule by mouth daily.      Physical Exam:  Filed Vitals:   10/06/12 1434  BP: 110/72  Pulse: 100  Temp: 98 F (36.7 C)  TempSrc: Oral  Resp: 13  Height: 5' 5.5" (1.664 m)  Weight: 145 lb 6.4 oz (65.953 kg)    Physical Exam  Vitals reviewed. Constitutional: She is oriented to person, place, and time and well-developed, well-nourished, and in no distress. No distress.  HENT:  Head: Normocephalic.  Small raised area the left forehead- pt reports this has decreased in size significantly; no ecchymosis   Eyes: Conjunctivae and EOM are normal. Pupils are equal, round, and reactive to light.  Neck: Normal range of motion. Neck supple.  Cardiovascular: Normal rate and regular rhythm.    Pulmonary/Chest: Effort normal and breath sounds normal.  Abdominal: Soft. Bowel sounds are normal.  Musculoskeletal: Normal range of motion.  No tenderness to left thumb on palpation however joint is weak and pain with movement  Neurological: She is alert and oriented to person, place, and time.  Skin: Skin is warm and dry. She is not diaphoretic.     Labs reviewed: Basic Metabolic Panel:  Recent Labs  16/10/96 2254 11/08/11 0649  11/11/11 0603 01/16/12 1448 02/22/12 1142 07/29/12 1703  NA 143 141  < > 140 138 137 140  K 4.1 4.3  < > 3.4* 4.4 4.4 5.2  CL 103 104  < > 102 101 103 99  CO2 24 27  < > 27 29 25 25   GLUCOSE 95 120*  < > 95 219* 210* 167*  BUN 18 18  < > 14 24* 19 23  CREATININE 0.71 0.76  < > 0.63 0.8 0.9 1.02*  CALCIUM 10.8* 9.1  < > 8.6 9.2 9.2 10.7*  MG  --  1.4*  --  1.6  --   --   --   < > = values in this interval not displayed. Liver Function Tests:  Recent Labs  11/07/11 2254 11/08/11 0649  AST 36 170*  ALT 25 79*  ALKPHOS 75 73  BILITOT 0.4 0.5  PROT 7.2 6.0  ALBUMIN 4.0 3.2*   No results found for this basename: LIPASE, AMYLASE,  in the last 8760 hours No results found for this basename: AMMONIA,  in the last 8760 hours CBC:  Recent Labs  11/11/11 0603 01/16/12 1448 02/22/12 1142  WBC 13.6* 9.6 10.5  NEUTROABS  --  6.2 7.5  HGB 10.2* 13.3 13.6  HCT 30.1* 41.2 42.4  MCV 88.5 89.5 89.4  PLT 186 268.0 233.0   Lipid Panel: No results found for this basename: CHOL, HDL, LDLCALC, TRIG, CHOLHDL, LDLDIRECT,  in the last 8760 hours  Past Procedures:     Assessment/Plan  1. DIABETES MELLITUS, TYPE II 250.00     Will cont tradjenta and have her take fasting and before meal blood sugars. Will follow up a1c  in 1 month and RTC for further evaluation   2.   Pain in thumb joint with movement, left 729.5     No swelling or erythma to joint- will get xray at this time to rule out fracture    3.   Weight loss, non-intentional   May stop  remeron- will follow weights

## 2012-10-21 ENCOUNTER — Ambulatory Visit: Payer: Medicare Other | Admitting: Neurology

## 2012-10-29 ENCOUNTER — Ambulatory Visit: Payer: Medicare Other | Admitting: Nurse Practitioner

## 2012-10-30 ENCOUNTER — Other Ambulatory Visit: Payer: Medicare Other

## 2012-10-30 DIAGNOSIS — E119 Type 2 diabetes mellitus without complications: Secondary | ICD-10-CM

## 2012-10-31 LAB — BASIC METABOLIC PANEL
BUN: 29 mg/dL — ABNORMAL HIGH (ref 8–27)
CO2: 25 mmol/L (ref 18–29)
Calcium: 10 mg/dL (ref 8.6–10.2)
Chloride: 101 mmol/L (ref 97–108)
Creatinine, Ser: 1.22 mg/dL — ABNORMAL HIGH (ref 0.57–1.00)
Glucose: 146 mg/dL — ABNORMAL HIGH (ref 65–99)

## 2012-10-31 LAB — HEMOGLOBIN A1C: Est. average glucose Bld gHb Est-mCnc: 157 mg/dL

## 2012-10-31 NOTE — Progress Notes (Signed)
Quick Note:  Please report to patient. The recent labs are stable. Continue same medication and careful diet. Kidney function not as good so drink plenty of water. ______

## 2012-11-03 ENCOUNTER — Other Ambulatory Visit: Payer: Medicare Other

## 2012-11-04 ENCOUNTER — Telehealth: Payer: Self-pay | Admitting: *Deleted

## 2012-11-04 NOTE — Telephone Encounter (Signed)
Message copied by Burnell Blanks on Tue Nov 04, 2012  5:29 PM ------      Message from: Cassell Clement      Created: Fri Oct 31, 2012 12:16 PM       Please report to patient.  The recent labs are stable. Continue same medication and careful diet. Kidney function not as good so drink plenty of water. ------

## 2012-11-04 NOTE — Telephone Encounter (Signed)
Left message with son to call back tomorrow. Per son has appt with Dr [name] in am, sent him a copy

## 2012-11-05 ENCOUNTER — Ambulatory Visit (INDEPENDENT_AMBULATORY_CARE_PROVIDER_SITE_OTHER): Payer: Medicare Other | Admitting: Nurse Practitioner

## 2012-11-05 VITALS — BP 130/64 | HR 72 | Temp 98.5°F | Resp 16 | Ht 65.0 in | Wt 144.8 lb

## 2012-11-05 DIAGNOSIS — R350 Frequency of micturition: Secondary | ICD-10-CM

## 2012-11-05 DIAGNOSIS — E119 Type 2 diabetes mellitus without complications: Secondary | ICD-10-CM

## 2012-11-05 DIAGNOSIS — S42309S Unspecified fracture of shaft of humerus, unspecified arm, sequela: Secondary | ICD-10-CM

## 2012-11-05 DIAGNOSIS — S62502S Fracture of unspecified phalanx of left thumb, sequela: Secondary | ICD-10-CM

## 2012-11-05 DIAGNOSIS — G4700 Insomnia, unspecified: Secondary | ICD-10-CM

## 2012-11-05 DIAGNOSIS — T148XXA Other injury of unspecified body region, initial encounter: Secondary | ICD-10-CM

## 2012-11-05 MED ORDER — LANCETS THIN MISC: Status: DC

## 2012-11-05 MED ORDER — HYDROCODONE-ACETAMINOPHEN 5-325 MG PO TABS: ORAL_TABLET | ORAL | Status: DC

## 2012-11-05 MED ORDER — TRAZODONE HCL 50 MG PO TABS: 25.0000 mg | ORAL_TABLET | Freq: Every day | ORAL | Status: DC

## 2012-11-05 NOTE — Patient Instructions (Addendum)
Will get xrays of back and ribs due to fall  Will get referral to urology due to urinary frequency  Will have therapy evaluate and treat left thumb   3 meals a day with snacks in between meals to ensure proper nutrition and prevent weight   Also increase hydration and avoid ADVIL, ALEVE or ibuprofen due to worsening kidney function   Trazodone was sent to pharmacy for sleep - may take 1/2 to 1 tablet at night to help with insomnia   To follow up in 3 month

## 2012-11-05 NOTE — Progress Notes (Signed)
[name], female   DOB: [birth year], 77 y.o.   MRN: [id number]   Allergies  Allergen Reactions  . Penicillins Hives and Rash  . Erythromycin Hives  . Neomycin-Bacitracin Zn-Polymyx Rash  . Sulfonamide Derivatives Hives and Rash  . Wool Alcohol (Lanolin Alcohol) Hives  . Nitrofurantoin Other (See Comments)    Unknown   . Nitrofurantoin Monohyd Macro Other (See Comments)    Unknown   . Sulfa Antibiotics     Chief Complaint  Patient presents with  . Follow-up    fell on Sun, labs     HPI: Patient is a 77 y.o. female seen in the office today for routine follow up Pt reports she had a fall 4 days ago. Pt reported she got up to fast and was dizzy and then was off balance and fell. However she was on the phone and trying to get information; she feel and hit her lower back left side on a piece of furniture.  Tender on lower spine and painful to breath  Left thumb pain is better however she has limited ROM  Would like a referral to urology- having trouble holding urine when she gets up she has to go right away-- has been a chronic issue; has already tried vesicare and another medication through her GYN which was not effective. Has also tired estrogen and exercises all which have been unsuccessful  Labs reviewed with pt Review of Systems:  Review of Systems  Constitutional: Negative for fever, chills, weight loss and malaise/fatigue.  Eyes: Negative.   Respiratory: Positive for shortness of breath (has chronic shortness of breath with activity- no worsening of this). Negative for cough.   Cardiovascular: Negative for chest pain.  Gastrointestinal: Negative for heartburn, diarrhea and constipation.  Genitourinary: Negative for dysuria.       Chronically with frequency and urgency- wears pads due to incontience  Musculoskeletal: Positive for myalgias, back pain, joint pain and falls (got up too fast and became dizzy- pt reports she normal gets up slowly and knows she needs to  do this).       Recent fall which has caused her lower back and rib pain  Skin: Negative.  Negative for itching and rash.  Neurological: Positive for dizziness. Negative for weakness.  Psychiatric/Behavioral: Negative for depression. The patient has insomnia (was previously on remeron to help with sleep and appetitie; however did not help with sleep so she quit taking). The patient is not nervous/anxious.      Past Medical History  Diagnosis Date  . Hypertension   . Osteoarthritis   . Hypercholesterolemia   . Paroxysmal atrial fibrillation     CHADS2: 2; managed with rate control and anticoagulation.   . Syncope and collapse     When patient stands up real quickly  . Normal cardiac stress test     a.  lexiscan MV - EF 72%, No ischemia  . Anemia     ? of lab error vs GI bleed  . COPD (chronic obstructive pulmonary disease)   . Weight loss   . Chronic anticoagulation     On Xarelto  . Pacemaker   . Pneumonia   . Exertional dyspnea   . Type II diabetes mellitus   . History of blood transfusion     "wasn't needed; had some bad lab results" (03-05-2012)  . Depression   . Prolonged grief reaction     "husband died 1 yr ago" (03/05/12)  . Urinary incontinence   .  Dizziness and giddiness 07/22/2012  . Benign paroxysmal positional vertigo 07/22/2012  . Tinnitus of left ear 07/22/2012  . Unspecified constipation   . Insomnia, unspecified   . Anxiety state, unspecified    Past Surgical History  Procedure Laterality Date  . Appendectomy  1952  . Cervical disc surgery  04/2009  . Hip arthroplasty  11/08/2011    Procedure: ARTHROPLASTY BIPOLAR HIP;  Surgeon: Shelda Pal, MD;  Location: [facility] OR;  Service: Orthopedics;  Laterality: Left;  . Insert / replace / remove pacemaker  02/29/2012  . Tubal ligation  1974  . Cholecystectomy  1984  . Polypectomy  11/21/1999    Endometrial polyps  . Eye surgery Right 03/2006    Cataract surgery  . Eye surgery  11/2004    Lid surgery   Social  History:   reports that she quit smoking about 26 years ago. Her smoking use included Cigarettes. She has a 70 pack-year smoking history. She has never used smokeless tobacco. She reports that she does not drink alcohol or use illicit drugs.  Family History  Problem Relation Age of Onset  . Arthritis Mother   . Kidney nephrosis Mother   . Ulcers Mother     Peptic  . Heart failure Mother     CHF  . Cancer Father     Kidney  . Early death Son     Self-inflicted gunshot    Medications: Patient's Medications  New Prescriptions   No medications on file  Previous Medications   CALCIUM CARBONATE (CALCIUM 600 PO)    Take 1 tablet by mouth daily.    CHOLECALCIFEROL (VITAMIN D PO)    Take 2,000 mg by mouth daily.     FLAXSEED, LINSEED, (FLAX SEED OIL) 1000 MG CAPS    Take 1,000 mg by mouth 3 (three) times daily.    LANCETS THIN MISC    Use three times daily to check blood sugar daily to control diabetes. 250.00   LINAGLIPTIN (TRADJENTA) 5 MG TABS TABLET    Take one tablet once a day for diabetes 250.00   METOPROLOL TARTRATE (LOPRESSOR) 25 MG TABLET    Take one tablet once daily for blood pressure   MULTIPLE VITAMIN (MULTIVITAMIN PO)    Take 1 tablet by mouth daily after breakfast.    VITAMIN C (ASCORBIC ACID) 500 MG TABLET    Take 500 mg by mouth daily.     XARELTO 20 MG TABS    TAKE 1 TABLET BY MOUTH DAILY  Modified Medications   No medications on file  Discontinued Medications   MIRTAZAPINE (REMERON) 7.5 MG TABLET    Take 1 tablet (7.5 mg total) by mouth at bedtime.     Physical Exam:  Filed Vitals:   11/05/12 1544  BP: 130/64  Pulse: 72  Temp: 98.5 F (36.9 C)  TempSrc: Oral  Resp: 16  Height: 5\' 5"  (1.651 m)  Weight: 144 lb 12.8 oz (65.681 kg)  SpO2: 99%    Physical Exam  Constitutional: She is oriented to person, place, and time and well-developed, well-nourished, and in no distress. No distress.  HENT:  Head: Normocephalic and atraumatic.  Nose: Nose normal.   Mouth/Throat: Oropharynx is clear and moist. No oropharyngeal exudate.  Eyes: Conjunctivae and EOM are normal. Pupils are equal, round, and reactive to light. Right eye exhibits no discharge. Left eye exhibits no discharge.  Neck: Normal range of motion. Neck supple. No thyromegaly present.  Cardiovascular: Normal rate.  An irregular rhythm  present.  Pulmonary/Chest: Effort normal and breath sounds normal. No respiratory distress.  Abdominal: Soft. Bowel sounds are normal. She exhibits no distension.  Musculoskeletal: Normal range of motion. She exhibits no edema.  Tender to the left of the lumbar spine due to fall with a minimal amount of bruising from spine to ribs on the left   Neurological: She is alert and oriented to person, place, and time. No cranial nerve deficit.  Skin: Skin is warm and dry. She is not diaphoretic.  Psychiatric: Affect normal.     Labs reviewed: Basic Metabolic Panel:  Recent Labs  16/10/96 2254 11/08/11 0649  11/11/11 0603  02/22/12 1142 07/29/12 1703 10/30/12 0902  NA 143 141  < > 140  < > 137 140 141  K 4.1 4.3  < > 3.4*  < > 4.4 5.2 4.9  CL 103 104  < > 102  < > 103 99 101  CO2 24 27  < > 27  < > 25 25 25   GLUCOSE 95 120*  < > 95  < > 210* 167* 146*  BUN 18 18  < > 14  < > 19 23 29*  CREATININE 0.71 0.76  < > 0.63  < > 0.9 1.02* 1.22*  CALCIUM 10.8* 9.1  < > 8.6  < > 9.2 10.7* 10.0  MG  --  1.4*  --  1.6  --   --   --   --   < > = values in this interval not displayed. Liver Function Tests:  Recent Labs  11/07/11 2254 11/08/11 0649  AST 36 170*  ALT 25 79*  ALKPHOS 75 73  BILITOT 0.4 0.5  PROT 7.2 6.0  ALBUMIN 4.0 3.2*   No results found for this basename: LIPASE, AMYLASE,  in the last 8760 hours No results found for this basename: AMMONIA,  in the last 8760 hours CBC:  Recent Labs  11/11/11 0603 01/16/12 1448 02/22/12 1142  WBC 13.6* 9.6 10.5  NEUTROABS  --  6.2 7.5  HGB 10.2* 13.3 13.6  HCT 30.1* 41.2 42.4  MCV 88.5 89.5  89.4  PLT 186 268.0 233.0      Assessment/Plan   1.   Diabetes 250.00   A1c improve- cont current medications   2.   Contusion of unspecified site 924.9  - pt to apply ice or heat as needed for pain; Rx given for hydrocodone to help with pain (tylenol has been ineffective); will also get xrays of the thoracic and lumbar spine and chest to rule out fractures    3.   Urinary frequency 788.41 - pt would like referral to urology at this time; failure with multiple medications; will give her referral at this time     4.   Thumb fracture, left, sequela 905.2   - will have pt go to ortho at this time for their recommendations   5.   Insomnia - will try trazodone 25-50 mg at night to help sleep

## 2012-11-06 NOTE — Telephone Encounter (Signed)
Labs actually ordered by PA at Dr Paralee Cancel office and was advise by her.

## 2012-11-06 NOTE — Telephone Encounter (Signed)
New Prob  Pt would like to speak with you regarding some blood work

## 2012-11-07 ENCOUNTER — Ambulatory Visit
Admission: RE | Admit: 2012-11-07 | Discharge: 2012-11-07 | Disposition: A | Discharge status: Home / Self Care | Payer: Medicare Other | Source: Ambulatory Visit | Attending: Nurse Practitioner | Admitting: Nurse Practitioner

## 2012-11-07 ENCOUNTER — Other Ambulatory Visit: Payer: Self-pay | Admitting: Geriatric Medicine

## 2012-11-07 DIAGNOSIS — T148XXA Other injury of unspecified body region, initial encounter: Secondary | ICD-10-CM

## 2012-11-07 DIAGNOSIS — E119 Type 2 diabetes mellitus without complications: Secondary | ICD-10-CM

## 2012-11-07 MED ORDER — LANCETS THIN MISC: Status: DC

## 2012-11-08 ENCOUNTER — Other Ambulatory Visit: Payer: Self-pay | Admitting: Nurse Practitioner

## 2012-11-24 ENCOUNTER — Other Ambulatory Visit: Payer: Medicare Other

## 2012-11-26 ENCOUNTER — Ambulatory Visit (INDEPENDENT_AMBULATORY_CARE_PROVIDER_SITE_OTHER): Payer: Medicare Other | Admitting: Cardiology

## 2012-11-26 ENCOUNTER — Encounter: Payer: Self-pay | Admitting: Cardiology

## 2012-11-26 ENCOUNTER — Ambulatory Visit: Payer: Medicare Other | Admitting: Nurse Practitioner

## 2012-11-26 VITALS — BP 134/80 | HR 116 | Ht 65.5 in | Wt 143.0 lb

## 2012-11-26 DIAGNOSIS — I495 Sick sinus syndrome: Secondary | ICD-10-CM

## 2012-11-26 DIAGNOSIS — F341 Dysthymic disorder: Secondary | ICD-10-CM

## 2012-11-26 DIAGNOSIS — I119 Hypertensive heart disease without heart failure: Secondary | ICD-10-CM

## 2012-11-26 DIAGNOSIS — I4891 Unspecified atrial fibrillation: Secondary | ICD-10-CM

## 2012-11-26 DIAGNOSIS — F329 Major depressive disorder, single episode, unspecified: Secondary | ICD-10-CM

## 2012-11-26 MED ORDER — METOPROLOL TARTRATE 25 MG PO TABS: 25.0000 mg | ORAL_TABLET | Freq: Two times a day (BID) | ORAL | Status: DC

## 2012-11-26 NOTE — Assessment & Plan Note (Signed)
The patient appears to be less overtly depressed this time.

## 2012-11-26 NOTE — Assessment & Plan Note (Signed)
Blood pressure is normal.  I checked orthostatic blood pressure readings on her.  There was no orthostatic hypotension demonstrated.  However her pulse rate increases and she complains of dizziness when I had her stand up.

## 2012-11-26 NOTE — Assessment & Plan Note (Signed)
Her pulse today is still rapid at 104 per minute.

## 2012-11-26 NOTE — Patient Instructions (Addendum)
Your physician has recommended you make the following change in your medication:  Increase lopressor 25 mg twice daily 12 hours apart  INCREASE WATER INTAKE  Your physician recommends that you schedule a follow-up appointment in: 2 MONTHS

## 2012-11-26 NOTE — Progress Notes (Signed)
Erin Vang Date of Birth:  [birth year] [facility] [address] [place], [place]  [address] [phone]         Fax   [phone]  History of Present Illness: This pleasant 77 year old woman is seen for a scheduled followup office visit. She has a past history of tachybradycardia syndrome with chronic atrial fibrillation and is on Xarelto. She has a permanent pacemaker implanted by Dr. Graciela Husbands. . She is diabetic and has had some problems with hypoglycemia and this is followed by Dr. Murray Hodgkins.  Since last visit she has had some dizzy spells.  She had an episode of syncope at church 6 weeks ago.  She would not let anyone take her to the emergency room however.   Current Outpatient Prescriptions  Medication Sig Dispense Refill  . Calcium Carbonate (CALCIUM 600 PO) Take 1 tablet by mouth daily.       . Cholecalciferol (VITAMIN D PO) Take 2,000 mg by mouth daily.        . Flaxseed, Linseed, (FLAX SEED OIL) 1000 MG CAPS Take 1,000 mg by mouth 3 (three) times daily.       . Lancets Thin MISC Use three times daily to check blood sugar daily to control diabetes. 250.00  100 each  5  . linagliptin (TRADJENTA) 5 MG TABS tablet Take one tablet once a day for diabetes 250.00  90 tablet  3  . metoprolol tartrate (LOPRESSOR) 25 MG tablet Take 1 tablet (25 mg total) by mouth 2 (two) times daily.  180 tablet  3  . Multiple Vitamin (MULTIVITAMIN PO) Take 1 tablet by mouth daily after breakfast.       . traZODone (DESYREL) 50 MG tablet Take 0.5-1 tablets (25-50 mg total) by mouth at bedtime.  30 tablet  3  . XARELTO 20 MG TABS TAKE 1 TABLET BY MOUTH DAILY  90 tablet  1   No current facility-administered medications for this visit.    Allergies  Allergen Reactions  . Penicillins Hives and Rash  . Erythromycin Hives  . Neomycin-Bacitracin Zn-Polymyx Rash  . Sulfonamide Derivatives Hives and Rash  . Wool Alcohol (Lanolin Alcohol) Hives  . Nitrofurantoin Other (See Comments)      Unknown   . Nitrofurantoin Monohyd Macro Other (See Comments)    Unknown   . Sulfa Antibiotics     Patient Active Problem List   Diagnosis Date Noted  . Insomnia 08/13/2012  . Diabetes 08/04/2012  . Depression 08/04/2012  . Dizziness and giddiness 07/22/2012  . Benign paroxysmal positional vertigo 07/22/2012  . Tinnitus of left ear 07/22/2012  . Tachy-brady syndrome 03/01/2012  . Pacemaker-Medtronic 02/29/2012  . Closed left hip fracture 11/08/2011  . Reactive depression 05/25/2011  . Anorexia 05/25/2011  . Anemia 05/25/2011  . Weight loss, non-intentional 05/25/2011  . Bradycardia 05/25/2011  . Constipation, chronic 05/25/2011  . Normal cardiac stress test   . Atrial fibrillation   . COPD (chronic obstructive pulmonary disease) 10/10/2010  . Benign hypertensive heart disease without heart failure 09/11/2010  . Osteoarthritis   . Hypercholesterolemia   . ALLERGIC RHINITIS 07/26/2009  . DIABETES MELLITUS, TYPE II 07/25/2009  . ANXIETY 07/25/2009  . HYPERTENSION 07/25/2009  . DYSPNEA 07/25/2009    History  Smoking status  . Former Smoker -- 2.00 packs/day for 35 years  . Types: Cigarettes  . Quit date: 04/23/1986  Smokeless tobacco  . Never Used    History  Alcohol Use No    Family History  Problem Relation Age of Onset  . Arthritis Mother   . Kidney nephrosis Mother   . Ulcers Mother     Peptic  . Heart failure Mother     CHF  . Cancer Father     Kidney  . Early death Son     Self-inflicted gunshot    Review of Systems: Constitutional: no fever chills diaphoresis or fatigue or change in weight.  Head and neck: no hearing loss, no epistaxis, no photophobia or visual disturbance. Respiratory: No cough, shortness of breath or wheezing. Cardiovascular: No chest pain peripheral edema, palpitations. Gastrointestinal: No abdominal distention, no abdominal pain, no change in bowel habits hematochezia or melena. Genitourinary: No dysuria, no frequency,  no urgency, no nocturia. Musculoskeletal:No arthralgias, no back pain, no gait disturbance or myalgias. Neurological: No dizziness, no headaches, no numbness, no seizures, no syncope, no weakness, no tremors. Hematologic: No lymphadenopathy, no easy bruising. Psychiatric: No confusion, no hallucinations, no sleep disturbance.    Physical Exam: Filed Vitals:   11/26/12 1622  BP: 134/80  Pulse: 116   blood pressure supine is 124/80 supine right arm, 124/78 sitting and 128/82 standing.  The general appearance reveals a well-developed well-nourished elderly woman in no distress.The head and neck exam reveals pupils equal and reactive.  Extraocular movements are full.  There is no scleral icterus.  The mouth and pharynx are normal.  The neck is supple.  The carotids reveal no bruits.  The jugular venous pressure is normal.  The  thyroid is not enlarged.  There is no lymphadenopathy.  The chest is clear to percussion and auscultation.  There are no rales or rhonchi.  Expansion of the chest is symmetrical.  The pulse is rapid and irregular The precordium is quiet.  The first heart sound is normal.  The second heart sound is physiologically split.  There is no murmur gallop rub or click.  There is no abnormal lift or heave.  The abdomen is soft and nontender.  The bowel sounds are normal.  The liver and spleen are not enlarged.  There are no abdominal masses.  There are no abdominal bruits.  Extremities reveal good pedal pulses.  There is tenderness at the base of the right thumb and she states that she was told that she has a fracture from a previous fall.  There is no phlebitis or edema.  There is no cyanosis or clubbing.  Strength is normal and symmetrical in all extremities.  There is no lateralizing weakness.  There are no sensory deficits.  The skin is warm and dry.  There is no rash.   EKG shows atrial fibrillation with rapid ventricular response.  Occasional ventricular paced beats are  seen  Assessment / Plan:  We are going to increase her Lopressor to twice a day.  We have recommended that she increase her water intake.  She may continue to salt her food. We will plan to have her return in 2 months for followup office visit and EKG

## 2012-12-01 ENCOUNTER — Ambulatory Visit: Payer: Medicare Other | Admitting: Nurse Practitioner

## 2012-12-15 ENCOUNTER — Other Ambulatory Visit: Payer: Self-pay | Admitting: *Deleted

## 2012-12-15 MED ORDER — GLUCOSE BLOOD VI STRP: ORAL_STRIP | Status: DC

## 2013-01-28 ENCOUNTER — Encounter: Payer: Self-pay | Admitting: Cardiology

## 2013-01-28 ENCOUNTER — Ambulatory Visit (INDEPENDENT_AMBULATORY_CARE_PROVIDER_SITE_OTHER): Payer: Medicare Other | Admitting: Cardiology

## 2013-01-28 VITALS — BP 140/86 | HR 116 | Ht 65.5 in | Wt 143.0 lb

## 2013-01-28 DIAGNOSIS — F329 Major depressive disorder, single episode, unspecified: Secondary | ICD-10-CM

## 2013-01-28 DIAGNOSIS — I4891 Unspecified atrial fibrillation: Secondary | ICD-10-CM

## 2013-01-28 DIAGNOSIS — I495 Sick sinus syndrome: Secondary | ICD-10-CM

## 2013-01-28 DIAGNOSIS — F341 Dysthymic disorder: Secondary | ICD-10-CM

## 2013-01-28 NOTE — Patient Instructions (Addendum)
INCREASE YOUR METOPROLOL TO 25 MG THREE TIMES A DAY  Your physician recommends that you schedule a follow-up appointment in: 2 MONTH OV/EKG

## 2013-01-28 NOTE — Progress Notes (Signed)
Erin Vang Date of Birth:  1936/01/31 [facility] [address] [place], [place]  [address] [phone]         Fax   [phone]  History of Present Illness: This pleasant 77 year old woman is seen for a scheduled followup office visit. She has a past history of tachybradycardia syndrome with chronic atrial fibrillation and is on Xarelto. She has a permanent pacemaker implanted by Dr. Graciela Husbands. . She is diabetic and has had some problems with hypoglycemia and this is followed by Dr. Murray Hodgkins.  Since last visit she has had some dizzy spells.  She had an episode of syncope at church 3 months ago but has had no recurrence. She would not let anyone take her to the emergency room on that occasion.   Current Outpatient Prescriptions  Medication Sig Dispense Refill  . Calcium Carbonate (CALCIUM 600 PO) Take 1 tablet by mouth daily.       . Cholecalciferol (VITAMIN D PO) Take 2,000 mg by mouth daily.        . Flaxseed, Linseed, (FLAX SEED OIL) 1000 MG CAPS Take 1,000 mg by mouth 3 (three) times daily.       Marland Kitchen glucose blood (BL TEST STRIP PACK) test strip Accucheck blood sugar test strips. Check blood sugar twice daily DX:250.02  300 each  3  . Lancets Thin MISC Use three times daily to check blood sugar daily to control diabetes. 250.00  100 each  5  . linagliptin (TRADJENTA) 5 MG TABS tablet Take one tablet once a day for diabetes 250.00  90 tablet  3  . metoprolol tartrate (LOPRESSOR) 25 MG tablet Take 25 mg by mouth 3 (three) times daily.      . Multiple Vitamin (MULTIVITAMIN PO) Take 1 tablet by mouth daily after breakfast.       . traZODone (DESYREL) 50 MG tablet Take 0.5-1 tablets (25-50 mg total) by mouth at bedtime.  30 tablet  3  . XARELTO 20 MG TABS TAKE 1 TABLET BY MOUTH DAILY  90 tablet  1   No current facility-administered medications for this visit.    Allergies  Allergen Reactions  . Penicillins Hives and Rash  . Erythromycin Hives  .  Neomycin-Bacitracin Zn-Polymyx Rash  . Sulfonamide Derivatives Hives and Rash  . Wool Alcohol [Lanolin Alcohol] Hives  . Nitrofurantoin Other (See Comments)    Unknown   . Nitrofurantoin Monohyd Macro Other (See Comments)    Unknown   . Sulfa Antibiotics     Patient Active Problem List   Diagnosis Date Noted  . Insomnia 08/13/2012  . Diabetes 08/04/2012  . Depression 08/04/2012  . Dizziness and giddiness 07/22/2012  . Benign paroxysmal positional vertigo 07/22/2012  . Tinnitus of left ear 07/22/2012  . Tachy-brady syndrome 03/01/2012  . Pacemaker-Medtronic 02/29/2012  . Closed left hip fracture 11/08/2011  . Reactive depression 05/25/2011  . Anorexia 05/25/2011  . Anemia 05/25/2011  . Weight loss, non-intentional 05/25/2011  . Bradycardia 05/25/2011  . Constipation, chronic 05/25/2011  . Normal cardiac stress test   . Atrial fibrillation   . COPD (chronic obstructive pulmonary disease) 10/10/2010  . Benign hypertensive heart disease without heart failure 09/11/2010  . Osteoarthritis   . Hypercholesterolemia   . ALLERGIC RHINITIS 07/26/2009  . DIABETES MELLITUS, TYPE II 07/25/2009  . ANXIETY 07/25/2009  . HYPERTENSION 07/25/2009  . DYSPNEA 07/25/2009    History  Smoking status  . Former Smoker -- 2.00 packs/day for 35 years  . Types:  Cigarettes  . Quit date: 04/23/1986  Smokeless tobacco  . Never Used    History  Alcohol Use No    Family History  Problem Relation Age of Onset  . Arthritis Mother   . Kidney nephrosis Mother   . Ulcers Mother     Peptic  . Heart failure Mother     CHF  . Cancer Father     Kidney  . Early death Son     Self-inflicted gunshot    Review of Systems: Constitutional: no fever chills diaphoresis or fatigue or change in weight.  Head and neck: no hearing loss, no epistaxis, no photophobia or visual disturbance. Respiratory: No cough, shortness of breath or wheezing. Cardiovascular: No chest pain peripheral edema,  palpitations. Gastrointestinal: No abdominal distention, no abdominal pain, no change in bowel habits hematochezia or melena. Genitourinary: No dysuria, no frequency, no urgency, no nocturia. Musculoskeletal:No arthralgias, no back pain, no gait disturbance or myalgias. Neurological: No dizziness, no headaches, no numbness, no seizures, no syncope, no weakness, no tremors. Hematologic: No lymphadenopathy, no easy bruising. Psychiatric: No confusion, no hallucinations, no sleep disturbance.    Physical Exam: Filed Vitals:   01/28/13 1634  BP: 140/86  Pulse: 116   blood pressure supine is 124/80 supine right arm, 124/78 sitting and 128/82 standing.  The general appearance reveals a well-developed well-nourished elderly woman in no distress.The head and neck exam reveals pupils equal and reactive.  Extraocular movements are full.  There is no scleral icterus.  The mouth and pharynx are normal.  The neck is supple.  The carotids reveal no bruits.  The jugular venous pressure is normal.  The  thyroid is not enlarged.  There is no lymphadenopathy.  The chest is clear to percussion and auscultation.  There are no rales or rhonchi.  Expansion of the chest is symmetrical.  The pulse is rapid and irregular The precordium is quiet.  The first heart sound is normal.  The second heart sound is physiologically split.  There is no murmur gallop rub or click.  There is no abnormal lift or heave.  The abdomen is soft and nontender.  The bowel sounds are normal.  The liver and spleen are not enlarged.  There are no abdominal masses.  There are no abdominal bruits.  Extremities reveal good pedal pulses.  There is tenderness at the base of the right thumb and she states that she was told that she has a fracture from a previous fall.  There is no phlebitis or edema.  There is no cyanosis or clubbing.  Strength is normal and symmetrical in all extremities.  There is no lateralizing weakness.  There are no sensory deficits.   The skin is warm and dry.  There is no rash.   EKG shows atrial fibrillation with rapid ventricular response.  Occasional ventricular paced beats are seen  Assessment / Plan:  We are going to increase her Lopressor to 3 times a day.  We have recommended that she increase her water intake.  She may continue to salt her food. We will plan to have her return in 2 months for followup office visit and EKG

## 2013-01-28 NOTE — Assessment & Plan Note (Signed)
The patient has not been experiencing any episodes of bradycardia.  She's had no further syncopal episodes over the past 3 months.Marland Kitchen

## 2013-01-28 NOTE — Assessment & Plan Note (Signed)
Her symptoms of depression have stabilized since last visit.

## 2013-01-28 NOTE — Assessment & Plan Note (Signed)
The patient continues to have rapid ventricular response to her atrial fibrillation.  She is presently on Toprol 25 mg twice a day.  We will increase this to 3 times a day to achieve better rate control.

## 2013-02-09 ENCOUNTER — Ambulatory Visit: Payer: Medicare Other | Admitting: Nurse Practitioner

## 2013-02-12 ENCOUNTER — Ambulatory Visit (INDEPENDENT_AMBULATORY_CARE_PROVIDER_SITE_OTHER): Payer: Medicare Other | Admitting: Nurse Practitioner

## 2013-02-12 ENCOUNTER — Ambulatory Visit: Payer: Medicare Other | Admitting: Nurse Practitioner

## 2013-02-12 ENCOUNTER — Encounter: Payer: Self-pay | Admitting: Nurse Practitioner

## 2013-02-12 VITALS — BP 128/84 | HR 79 | Temp 97.7°F | Resp 16 | Wt 143.0 lb

## 2013-02-12 DIAGNOSIS — E119 Type 2 diabetes mellitus without complications: Secondary | ICD-10-CM

## 2013-02-12 DIAGNOSIS — I10 Essential (primary) hypertension: Secondary | ICD-10-CM

## 2013-02-12 DIAGNOSIS — Z23 Encounter for immunization: Secondary | ICD-10-CM

## 2013-02-12 DIAGNOSIS — I4891 Unspecified atrial fibrillation: Secondary | ICD-10-CM

## 2013-02-12 DIAGNOSIS — N3941 Urge incontinence: Secondary | ICD-10-CM

## 2013-02-12 DIAGNOSIS — G4700 Insomnia, unspecified: Secondary | ICD-10-CM

## 2013-02-12 MED ORDER — RIVAROXABAN 20 MG PO TABS: 20.0000 mg | ORAL_TABLET | Freq: Every day | ORAL | Status: DC

## 2013-02-12 MED ORDER — FESOTERODINE FUMARATE ER 4 MG PO TB24: 4.0000 mg | ORAL_TABLET | Freq: Every day | ORAL | Status: DC

## 2013-02-12 MED ORDER — LINAGLIPTIN 5 MG PO TABS: ORAL_TABLET | ORAL | Status: DC

## 2013-02-12 MED ORDER — GLUCOSE BLOOD VI STRP
ORAL_STRIP | Status: DC
Start: 2013-02-12 — End: 2013-11-12

## 2013-02-12 NOTE — Patient Instructions (Signed)
Will get lab work today  Refills provided  To follow up in 4 months for EV with fasting lab work before

## 2013-02-12 NOTE — Progress Notes (Signed)
[name], female   DOB: 09/05/1935, 77 y.o.   MRN: [id number]   Allergies  Allergen Reactions  . Penicillins Hives and Rash  . Erythromycin Hives  . Neomycin-Bacitracin Zn-Polymyx Rash  . Sulfonamide Derivatives Hives and Rash  . Wool Alcohol [Lanolin Alcohol] Hives  . Nitrofurantoin Other (See Comments)    Unknown   . Nitrofurantoin Monohyd Macro Other (See Comments)    Unknown   . Sulfa Antibiotics     Chief Complaint  Patient presents with  . Medical Managment of Chronic Issues    4 month f/u   . other    BS ranging 132-159 fasting; Td > 10 yrs    HPI: Patient is a 77 y.o. female seen in the office today for routine follow up.  Pt does not have any complaints or concerns for today visit. Reports she gets dizzy occasionally; metoprolol was adjusted but this has not had any effect. No recent falls Good appetite No changes in mood or worsening depression.   Review of Systems:  Review of Systems  Constitutional: Negative for fever, chills, weight loss and malaise/fatigue.  HENT: Negative for congestion, ear discharge, ear pain, hearing loss and tinnitus.   Eyes: Negative.   Respiratory: Negative for cough. Shortness of breath: has chronic shortness of breath with activity- no worsening of this.   Cardiovascular: Negative for chest pain.  Gastrointestinal: Negative for heartburn, diarrhea and constipation.  Genitourinary: Negative for dysuria.       Chronically with frequency and urgency- wears pads due to incontience  Musculoskeletal: Negative for back pain, falls (got up too fast and became dizzy- pt reports she normal gets up slowly and knows she needs to do this), joint pain and myalgias.  Skin: Negative.  Negative for itching and rash.  Neurological: Positive for dizziness. Negative for tingling, tremors, sensory change, focal weakness, weakness and headaches.  Psychiatric/Behavioral: Negative for depression. The patient has insomnia (was previously on  remeron to help with sleep and appetitie; however did not help with sleep so she quit taking). The patient is not nervous/anxious.      Past Medical History  Diagnosis Date  . Hypertension   . Osteoarthritis   . Hypercholesterolemia   . Paroxysmal atrial fibrillation     CHADS2: 2; managed with rate control and anticoagulation.   . Syncope and collapse     When patient stands up real quickly  . Normal cardiac stress test     a.  lexiscan MV - EF 72%, No ischemia  . Anemia     ? of lab error vs GI bleed  . COPD (chronic obstructive pulmonary disease)   . Weight loss   . Chronic anticoagulation     On Xarelto  . Pacemaker   . Pneumonia   . Exertional dyspnea   . Type II diabetes mellitus   . History of blood transfusion     "wasn't needed; had some bad lab results" (2012-03-03)  . Depression   . Prolonged grief reaction     "husband died 1 yr ago" (03-03-12)  . Urinary incontinence   . Dizziness and giddiness 07/22/2012  . Benign paroxysmal positional vertigo 07/22/2012  . Tinnitus of left ear 07/22/2012  . Unspecified constipation   . Insomnia, unspecified   . Anxiety state, unspecified    Past Surgical History  Procedure Laterality Date  . Appendectomy  1952  . Cervical disc surgery  04/2009  . Hip arthroplasty  11/08/2011    Procedure:  ARTHROPLASTY BIPOLAR HIP;  Surgeon: Shelda Pal, MD;  Location: [facility] OR;  Service: Orthopedics;  Laterality: Left;  . Insert / replace / remove pacemaker  02/29/2012  . Tubal ligation  1974  . Cholecystectomy  1984  . Polypectomy  11/21/1999    Endometrial polyps  . Eye surgery Right 03/2006    Cataract surgery  . Eye surgery  11/2004    Lid surgery   Social History:   reports that she quit smoking about 26 years ago. Her smoking use included Cigarettes. She has a 70 pack-year smoking history. She has never used smokeless tobacco. She reports that she does not drink alcohol or use illicit drugs.  Family History  Problem Relation Age of  Onset  . Arthritis Mother   . Kidney nephrosis Mother   . Ulcers Mother     Peptic  . Heart failure Mother     CHF  . Cancer Father     Kidney  . Early death Son     Self-inflicted gunshot    Medications: Patient's Medications  New Prescriptions   No medications on file  Previous Medications   CALCIUM CARBONATE (CALCIUM 600 PO)    Take 1 tablet by mouth daily.    CHOLECALCIFEROL (VITAMIN D PO)    Take 2,000 mg by mouth daily.     FESOTERODINE (TOVIAZ) 4 MG TB24 TABLET    Take 4 mg by mouth daily. Take one tablet daily   FLAXSEED, LINSEED, (FLAX SEED OIL) 1000 MG CAPS    Take 1,000 mg by mouth 3 (three) times daily.    GLUCOSE BLOOD (BL TEST STRIP PACK) TEST STRIP    Accucheck blood sugar test strips. Check blood sugar twice daily DX:250.02   LANCETS THIN MISC    Use three times daily to check blood sugar daily to control diabetes. 250.00   LINAGLIPTIN (TRADJENTA) 5 MG TABS TABLET    Take one tablet once a day for diabetes 250.00   METOPROLOL TARTRATE (LOPRESSOR) 25 MG TABLET    Take 25 mg by mouth 3 (three) times daily.   MULTIPLE VITAMIN (MULTIVITAMIN PO)    Take 1 tablet by mouth daily after breakfast.    TRAZODONE (DESYREL) 50 MG TABLET    Take 0.5-1 tablets (25-50 mg total) by mouth at bedtime.   XARELTO 20 MG TABS    TAKE 1 TABLET BY MOUTH DAILY  Modified Medications   No medications on file  Discontinued Medications   No medications on file     Physical Exam:  Filed Vitals:   02/12/13 1430  BP: 134/80  Pulse: 102  Temp: 97.7 F (36.5 C)  TempSrc: Oral  Resp: 16  Weight: 143 lb (64.864 kg)  SpO2: 98%   Physical Exam  Vitals reviewed. Constitutional: She is well-developed, well-nourished, and in no distress. No distress.  HENT:  Head: Normocephalic and atraumatic.  Mouth/Throat: Oropharynx is clear and moist. No oropharyngeal exudate.  Eyes: Conjunctivae and EOM are normal. Pupils are equal, round, and reactive to light.  Neck: Normal range of motion. Neck  supple. No thyromegaly present.  Cardiovascular: Normal rate.  An irregularly irregular rhythm present.  Murmur heard. Pulmonary/Chest: Effort normal and breath sounds normal. No respiratory distress.  Abdominal: Soft. Bowel sounds are normal. She exhibits no distension.  Musculoskeletal: She exhibits no edema and no tenderness.  Neurological: She is alert.  Uses cane  Skin: Skin is warm and dry. She is not diaphoretic. No erythema.     Labs  reviewed: Basic Metabolic Panel:  Recent Labs  78/29/56 1142 07/29/12 1703 10/30/12 0902  NA 137 140 141  K 4.4 5.2 4.9  CL 103 99 101  CO2 25 25 25   GLUCOSE 210* 167* 146*  BUN 19 23 29*  CREATININE 0.9 1.02* 1.22*  CALCIUM 9.2 10.7* 10.0   Liver Function Tests: No results found for this basename: AST, ALT, ALKPHOS, BILITOT, PROT, ALBUMIN,  in the last 8760 hours No results found for this basename: LIPASE, AMYLASE,  in the last 8760 hours No results found for this basename: AMMONIA,  in the last 8760 hours CBC:  Recent Labs  02/22/12 1142  WBC 10.5  NEUTROABS 7.5  HGB 13.6  HCT 42.4  MCV 89.4  PLT 233.0   Lipid Panel: No results found for this basename: CHOL, HDL, LDLCALC, TRIG, CHOLHDL, LDLDIRECT,  in the last 8760 hours TSH: No results found for this basename: TSH,  in the last 8760 hours A1C: Lab Results  Component Value Date   HGBA1C 7.1* 10/30/2012     Assessment/Plan 1. Diabetes - glucose blood (BL TEST STRIP PACK) test strip; Accucheck blood sugar test strips. Check blood sugar twice daily DX:250.02  Dispense: 300 each; Refill: 3 - linagliptin (TRADJENTA) 5 MG TABS tablet; Take one tablet once a day for diabetes 250.00  Dispense: 90 tablet; Refill: 3 - Hemoglobin A1c - Comprehensive metabolic panel  2. Need for prophylactic vaccination and inoculation against influenza Flu vaccine given  3. HYPERTENSION Stable at this time; no orthostatic hypotension - Comprehensive metabolic panel  4. Atrial  fibrillation Stable; rate controlled - Rivaroxaban (XARELTO) 20 MG TABS tablet; Take 1 tablet (20 mg total) by mouth daily.  Dispense: 90 tablet; Refill: 1 - CBC With differential/Platelet  5. Insomnia Patient is stable; continue current regimen. Will monitor and make changes as necessary.  6. Urge incontinence -seeing urology - fesoterodine (TOVIAZ) 4 MG TB24 tablet; Take 1 tablet (4 mg total) by mouth daily. Take one tablet daily  Dispense: 30 tablet; Refill: 0    Will have pt follow up in 4 months for EV with MMSE; to get fasting lipids, A1C, CMP before visit

## 2013-02-13 ENCOUNTER — Telehealth: Payer: Self-pay | Admitting: *Deleted

## 2013-02-13 LAB — COMPREHENSIVE METABOLIC PANEL
ALT: 14 IU/L (ref 0–32)
AST: 22 IU/L (ref 0–40)
Albumin/Globulin Ratio: 1.8 (ref 1.1–2.5)
Albumin: 4.3 g/dL (ref 3.5–4.8)
Alkaline Phosphatase: 83 IU/L (ref 39–117)
Chloride: 104 mmol/L (ref 97–108)
GFR calc Af Amer: 64 mL/min/{1.73_m2} (ref >59)
GFR calc non Af Amer: 55 mL/min/{1.73_m2} — ABNORMAL LOW (ref >59)
Glucose: 121 mg/dL — ABNORMAL HIGH (ref 65–99)
Potassium: 4.6 mmol/L (ref 3.5–5.2)
Sodium: 145 mmol/L — ABNORMAL HIGH (ref 134–144)
Total Bilirubin: 0.3 mg/dL (ref 0.0–1.2)
Total Protein: 6.7 g/dL (ref 6.0–8.5)

## 2013-02-13 LAB — CBC WITH DIFFERENTIAL
Basophils Absolute: 0.1 10*3/uL (ref 0.0–0.2)
Eosinophils Absolute: 0.1 10*3/uL (ref 0.0–0.4)
Immature Granulocytes: 0 %
Lymphocytes Absolute: 2.4 10*3/uL (ref 0.7–3.1)
Lymphs: 26 %
MCHC: 33.9 g/dL (ref 31.5–35.7)
Monocytes: 9 %
Platelets: 254 10*3/uL (ref 150–379)
RDW: 14.3 % (ref 12.3–15.4)
WBC: 9.5 10*3/uL (ref 3.4–10.8)

## 2013-02-13 LAB — HEMOGLOBIN A1C: Est. average glucose Bld gHb Est-mCnc: 177 mg/dL

## 2013-02-13 NOTE — Telephone Encounter (Signed)
Message copied by Burnell Blanks on Fri Feb 13, 2013  1:02 PM ------      Message from: Cassell Clement      Created: Fri Feb 13, 2013  8:54 AM       Please report to patient.  The recent labs are stable. Continue same medication and careful diet.  Hemoglobin is stable.  No anemia.  The A1c is still elevated.  Needs careful diabetic diet.  Her kidney function is improved. ------

## 2013-02-13 NOTE — Telephone Encounter (Signed)
Advised patient of lab results.  Patient c/o of still having a lot of dizziness. Patient saw NP/PA yesterday and was told they felt coming from Metoprolol being increased (note not completed). Heart rate elevated at last ov. Will forward to  Dr. Patty Sermons for review

## 2013-02-13 NOTE — Progress Notes (Signed)
Quick Note:  Please report to patient. The recent labs are stable. Continue same medication and careful diet. Hemoglobin is stable. No anemia. The A1c is still elevated. Needs careful diabetic diet. Her kidney function is improved. ______

## 2013-02-13 NOTE — Telephone Encounter (Signed)
Try reducing the metoprolol back to BID and see if she feels better.

## 2013-02-13 NOTE — Telephone Encounter (Signed)
Discussed with patient and she has been taking her Metoprolol at 10:00 am, 12:30, and 5:00 pm. Advised to take in the morning and before bed, other dose needs to be halfway in between. Discussed with  Dr. Patty Sermons, call back if no better. Patient verbalized understanding.

## 2013-02-16 ENCOUNTER — Other Ambulatory Visit: Payer: Self-pay | Admitting: *Deleted

## 2013-02-18 ENCOUNTER — Telehealth: Payer: Self-pay | Admitting: Cardiology

## 2013-02-18 ENCOUNTER — Other Ambulatory Visit: Payer: Self-pay | Admitting: *Deleted

## 2013-02-18 NOTE — Telephone Encounter (Signed)
Pt would like for Melinda to call her tomorrow (02/19/13).

## 2013-02-18 NOTE — Telephone Encounter (Signed)
New Problem  Pt requests a call back // No further Details

## 2013-02-19 NOTE — Telephone Encounter (Signed)
Did stop the Metoprolol secondary to being extremely dizzy. This is better except for when she first stands up.  Blood pressure 126/83 and heart rate 109 this am without the Metoprolol. Urologist did give her Gala Murdoch 2 weeks ago. Does seem to help with her overactive bladder. Will forward to  Dr. Patty Sermons for review.   Did stop Trazodone as well.

## 2013-02-19 NOTE — Telephone Encounter (Signed)
Advised patient, verbalized understanding  

## 2013-02-19 NOTE — Telephone Encounter (Signed)
Leave off metoprolol unless her heart rate is sustained above 125 in which case she can take a half a tablet to help slow her heart rate. The Gala Murdoch may also be contributing to her dizziness when she stands up

## 2013-03-03 ENCOUNTER — Telehealth: Payer: Self-pay | Admitting: *Deleted

## 2013-03-03 NOTE — Telephone Encounter (Signed)
OK to resume metformin and stop tradjenta.

## 2013-03-03 NOTE — Telephone Encounter (Signed)
Spoke with patient and informed her that Dr. [name] agreed with the medication change back to metformin. Patient  States that she understands and will let Dr. [name] know of any changes.

## 2013-03-03 NOTE — Telephone Encounter (Signed)
Request to stop her tradjenta and restart her metformin. Tradjenta is causing dizziness, weakness and fast heartbeat. Please advise.

## 2013-03-12 ENCOUNTER — Ambulatory Visit
Admission: RE | Admit: 2013-03-12 | Discharge: 2013-03-12 | Disposition: A | Discharge status: Home / Self Care | Payer: Medicare Other | Source: Ambulatory Visit | Attending: Nurse Practitioner | Admitting: Nurse Practitioner

## 2013-03-12 ENCOUNTER — Ambulatory Visit (INDEPENDENT_AMBULATORY_CARE_PROVIDER_SITE_OTHER): Payer: Medicare Other | Admitting: Nurse Practitioner

## 2013-03-12 ENCOUNTER — Encounter: Payer: Self-pay | Admitting: Nurse Practitioner

## 2013-03-12 VITALS — BP 130/74 | HR 90 | Temp 97.8°F | Resp 14 | Wt 143.8 lb

## 2013-03-12 DIAGNOSIS — J209 Acute bronchitis, unspecified: Secondary | ICD-10-CM

## 2013-03-12 DIAGNOSIS — R42 Dizziness and giddiness: Secondary | ICD-10-CM

## 2013-03-12 DIAGNOSIS — E119 Type 2 diabetes mellitus without complications: Secondary | ICD-10-CM

## 2013-03-12 MED ORDER — LANCETS THIN MISC: Status: DC

## 2013-03-12 MED ORDER — DOXYCYCLINE HYCLATE 100 MG PO TABS: 100.0000 mg | ORAL_TABLET | Freq: Two times a day (BID) | ORAL | Status: DC

## 2013-03-12 MED ORDER — SACCHAROMYCES BOULARDII 250 MG PO CAPS: 250.0000 mg | ORAL_CAPSULE | Freq: Two times a day (BID) | ORAL | Status: DC

## 2013-03-12 NOTE — Patient Instructions (Signed)
Will start you on doxycycline 100 mg twice daily for 1 week To take mucinex DM by mouth twice daily with full glass of water for 2 weeks  Take florastor twice daily for 2 weeks  Will not make any other changes to your medications at this time to follow up in 2 weeks regarding your dizziness

## 2013-03-12 NOTE — Progress Notes (Signed)
[name], female   DOB: [birth year], 77 y.o.   MRN: [id number]   Allergies  Allergen Reactions  . Penicillins Hives and Rash  . Erythromycin Hives  . Neomycin-Bacitracin Zn-Polymyx Rash  . Sulfonamide Derivatives Hives and Rash  . Wool Alcohol [Lanolin Alcohol] Hives  . Nitrofurantoin Other (See Comments)    Unknown   . Nitrofurantoin Monohyd Macro Other (See Comments)    Unknown   . Sulfa Antibiotics     Chief Complaint  Patient presents with  . Acute Visit    head/chest congestion with a cough w/yellowish phlegm 3-4 days.    Marland Kitchen other    dizzy and fell in kitchen 03/11/13  . Medication Problem     Tradjenta causing dizziness ?  . other    Tdap>10 yrs, and colonoscopy done >10 yrs and was normal    HPI: Patient is a 77 y.o. female seen in the office today for chest congestion and cough; clear with yellow steaks in sputum; having some shortness of breath at time; worse with cough, no fevers Cough keeping her up at night; Using a diabetic cough syrup which does not help  No chest pains  Is here today to follow up on dizziness; pt was started on enablex and toviaz from urologist and these medication made dizziness worse; then she stopped taking this;  She then called office due to ongoing dizziness and was instructed to stop taking tradjenta and start metformin then when that did not help and she fell in her kitchen she switched back to tradjenta and now the dizziness is much better. Still having problems with being steady and having to sit down.  Has tried meclizine in the past this has not helped   Review of Systems:  Review of Systems  Constitutional: Positive for malaise/fatigue. Negative for fever and chills.  HENT: Positive for congestion (chest congestion ) and sore throat.   Respiratory: Positive for cough, sputum production and shortness of breath.        Has had cough congestion and shortness of breath x 3-4 days  Cardiovascular: Positive for  palpitations (will check HR and it is not above 125). Negative for chest pain.  Gastrointestinal: Negative for diarrhea and constipation.  Genitourinary: Negative for dysuria, urgency and frequency.  Neurological: Positive for dizziness and weakness. Negative for headaches.  Psychiatric/Behavioral: The patient has insomnia.      Past Medical History  Diagnosis Date  . Hypertension   . Osteoarthritis   . Hypercholesterolemia   . Paroxysmal atrial fibrillation     CHADS2: 2; managed with rate control and anticoagulation.   . Syncope and collapse     When patient stands up real quickly  . Normal cardiac stress test     a.  lexiscan MV - EF 72%, No ischemia  . Anemia     ? of lab error vs GI bleed  . COPD (chronic obstructive pulmonary disease)   . Weight loss   . Chronic anticoagulation     On Xarelto  . Pacemaker   . Pneumonia   . Exertional dyspnea   . Type II diabetes mellitus   . History of blood transfusion     "wasn't needed; had some bad lab results" (2012/03/17)  . Depression   . Prolonged grief reaction     "husband died 1 yr ago" (17-Mar-2012)  . Urinary incontinence   . Dizziness and giddiness 07/22/2012  . Benign paroxysmal positional vertigo 07/22/2012  . Tinnitus of left  ear 07/22/2012  . Unspecified constipation   . Insomnia, unspecified   . Anxiety state, unspecified    Past Surgical History  Procedure Laterality Date  . Appendectomy  1952  . Cervical disc surgery  04/2009  . Hip arthroplasty  11/08/2011    Procedure: ARTHROPLASTY BIPOLAR HIP;  Surgeon: Shelda Pal, MD;  Location: [facility] OR;  Service: Orthopedics;  Laterality: Left;  . Insert / replace / remove pacemaker  02/29/2012  . Tubal ligation  1974  . Cholecystectomy  1984  . Polypectomy  11/21/1999    Endometrial polyps  . Eye surgery Right 03/2006    Cataract surgery  . Eye surgery  11/2004    Lid surgery   Social History:   reports that she quit smoking about 26 years ago. Her smoking use included  Cigarettes. She has a 70 pack-year smoking history. She has never used smokeless tobacco. She reports that she does not drink alcohol or use illicit drugs.  Family History  Problem Relation Age of Onset  . Arthritis Mother   . Kidney nephrosis Mother   . Ulcers Mother     Peptic  . Heart failure Mother     CHF  . Cancer Father     Kidney  . Early death Son     Self-inflicted gunshot    Medications: Patient's Medications  New Prescriptions   No medications on file  Previous Medications   CALCIUM CARBONATE (CALCIUM 600 PO)    Take 1 tablet by mouth daily.    CHOLECALCIFEROL (VITAMIN D PO)    Take 2,000 mg by mouth daily.     FLAXSEED, LINSEED, (FLAX SEED OIL) 1000 MG CAPS    Take 1,000 mg by mouth 3 (three) times daily.    GLUCOSE BLOOD (BL TEST STRIP PACK) TEST STRIP    Accucheck blood sugar test strips. Check blood sugar twice daily DX:250.02   LANCETS THIN MISC    Use three times daily to check blood sugar daily to control diabetes. 250.00   LINAGLIPTIN (TRADJENTA) 5 MG TABS TABLET    Take one tablet once a day for diabetes 250.00   METOPROLOL TARTRATE (LOPRESSOR) 25 MG TABLET    Take 25 mg by mouth as needed (1/2 tablet as needed for heart rate above 125).    MULTIPLE VITAMIN (MULTIVITAMIN PO)    Take 1 tablet by mouth daily after breakfast.    RIVAROXABAN (XARELTO) 20 MG TABS TABLET    Take 1 tablet (20 mg total) by mouth daily.   TRAZODONE (DESYREL) 50 MG TABLET    Take 0.5-1 tablets (25-50 mg total) by mouth at bedtime.  Modified Medications   No medications on file  Discontinued Medications   FESOTERODINE (TOVIAZ) 4 MG TB24 TABLET    Take 1 tablet (4 mg total) by mouth daily. Take one tablet daily     Physical Exam:  Filed Vitals:   03/12/13 1520  BP: 130/74  Pulse: 90  Temp: 97.8 F (36.6 C)  TempSrc: Oral  Resp: 14  Weight: 143 lb 12.8 oz (65.227 kg)  SpO2: 99%    Physical Exam  Constitutional: She is oriented to person, place, and time. No distress.   HENT:  Head: Normocephalic and atraumatic.  Right Ear: External ear normal.  Left Ear: External ear normal.  Nose: Rhinorrhea present.  Mouth/Throat: Oropharynx is clear and moist. No oropharyngeal exudate.  Eyes: Conjunctivae and EOM are normal. Pupils are equal, round, and reactive to light.  Neck: Normal range  of motion. Neck supple. No thyromegaly present.  Cardiovascular: Normal rate, regular rhythm and normal heart sounds.   Pulmonary/Chest: Effort normal. No respiratory distress. She has wheezes (right upper lobe).  Abdominal: Soft. Bowel sounds are normal. She exhibits no distension. There is no tenderness.  Lymphadenopathy:    She has no cervical adenopathy.  Neurological: She is alert and oriented to person, place, and time.  Skin: Skin is warm and dry. She is not diaphoretic. No erythema.  Psychiatric: Affect normal.     Labs reviewed: Basic Metabolic Panel:  Recent Labs  16/10/96 1703 10/30/12 0902 02/12/13 1539  NA 140 141 145*  K 5.2 4.9 4.6  CL 99 101 104  CO2 25 25 23   GLUCOSE 167* 146* 121*  BUN 23 29* 26  CREATININE 1.02* 1.22* 1.00  CALCIUM 10.7* 10.0 9.5   Liver Function Tests:  Recent Labs  02/12/13 1539  AST 22  ALT 14  ALKPHOS 83  BILITOT 0.3  PROT 6.7   No results found for this basename: LIPASE, AMYLASE,  in the last 8760 hours No results found for this basename: AMMONIA,  in the last 8760 hours CBC:  Recent Labs  02/12/13 1539  WBC 9.5  NEUTROABS 5.9  HGB 13.3  HCT 39.2  MCV 85  PLT 254   Lipid Panel: No results found for this basename: CHOL, HDL, LDLCALC, TRIG, CHOLHDL, LDLDIRECT,  in the last 8760 hours TSH: No results found for this basename: TSH,  in the last 8760 hours A1C: No components found with this basename: A1C,     Assessment/Plan 1. Dizziness -improved now that she is off enablex and toviaz, lopressor, and has gone back to tradjenta (off metformin)  Will not make any other changes at this time but follow  up with pt in 2 weeks   2. Acute bronchitis -increase hydration - DG Chest 2 View r/o pneumonia  - doxycycline (VIBRA-TABS) 100 MG tablet; Take 1 tablet (100 mg total) by mouth 2 (two) times daily.  Dispense: 14 tablet; Refill: 0 - saccharomyces boulardii (FLORASTOR) 250 MG capsule; Take 1 capsule (250 mg total) by mouth 2 (two) times daily.  Dispense: 30 capsule; Refill: 0 -instructed on when to seek emergency care; pt understands if symptoms worsen, shortness of breath worsens, fever occurs while on antibiotics to follow up   3. Diabetes -metformin was making her dizziness worse -conts on tradjenta - Lancets Thin MISC; Use three times daily to check blood sugar daily to control diabetes. 250.00  Dispense: 100 each; Refill: 5

## 2013-03-17 ENCOUNTER — Encounter: Payer: Self-pay | Admitting: Internal Medicine

## 2013-03-17 ENCOUNTER — Ambulatory Visit (INDEPENDENT_AMBULATORY_CARE_PROVIDER_SITE_OTHER): Payer: Medicare Other | Admitting: Internal Medicine

## 2013-03-17 VITALS — BP 138/90 | HR 112 | Ht 65.5 in | Wt 143.4 lb

## 2013-03-17 DIAGNOSIS — Z950 Presence of cardiac pacemaker: Secondary | ICD-10-CM

## 2013-03-17 DIAGNOSIS — I495 Sick sinus syndrome: Secondary | ICD-10-CM

## 2013-03-17 DIAGNOSIS — R0602 Shortness of breath: Secondary | ICD-10-CM

## 2013-03-17 DIAGNOSIS — I4891 Unspecified atrial fibrillation: Secondary | ICD-10-CM

## 2013-03-17 DIAGNOSIS — I10 Essential (primary) hypertension: Secondary | ICD-10-CM

## 2013-03-17 LAB — MDC_IDC_ENUM_SESS_TYPE_INCLINIC
Brady Statistic RV Percent Paced: 29 %
Lead Channel Impedance Value: 0 Ohm
Lead Channel Impedance Value: 745 Ohm
Lead Channel Pacing Threshold Amplitude: 0.5 V
Lead Channel Sensing Intrinsic Amplitude: 11.2 mV
Lead Channel Setting Pacing Amplitude: 2.5 V
Lead Channel Setting Pacing Pulse Width: 0.4 ms
Lead Channel Setting Sensing Sensitivity: 5.6 mV

## 2013-03-17 MED ORDER — DILTIAZEM HCL ER COATED BEADS 120 MG PO CP24: 120.0000 mg | ORAL_CAPSULE | Freq: Every day | ORAL | Status: DC

## 2013-03-17 NOTE — Assessment & Plan Note (Addendum)
Permanent AF with an excessively rapid rate. 30% of her beats or faster than 120, 50% or faster than 100. She has not tolerated metoprolol secondary to dizziness. We will try her on diltiazem. There may be a role for adjunctive digoxin although I would like to avoid that given recent data on increased mortality risk.  She is to see Dr. TB next month. I will ask him to have her device interrogated and is heart rate control is still adequate, he can augment rate control

## 2013-03-17 NOTE — Assessment & Plan Note (Signed)
I think there is likely a component of HFpEF aggravated by rapid atrial fibrillation. Echocardiogram 2013 demonstrated normal left ventricular function. It may be appropriate to consider repeat echo assessment of LV function given the rapid rates.

## 2013-03-17 NOTE — Patient Instructions (Addendum)
Your physician has recommended you make the following change in your medication:  1) Stop Metoprolol 2) Start Diltiazem 120 mg daily    Remote monitoring is used to monitor your Pacemaker of ICD from home. This monitoring reduces the number of office visits required to check your device to one time per year. It allows [place] to keep an eye on the functioning of your device to ensure it is working properly. You are scheduled for a device check from home on 06/18/2013. You may send your transmission at any time that day. If you have a wireless device, the transmission will be sent automatically. After your physician reviews your transmission, you will receive a postcard with your next transmission date.  Your physician wants you to follow-up in: 8-10 weeks.  You will receive a reminder letter in the mail two months in advance. If you don't receive a letter, please call our office to schedule the follow-up appointment.

## 2013-03-17 NOTE — Assessment & Plan Note (Signed)
Stable post pacing 

## 2013-03-17 NOTE — Progress Notes (Signed)
Patient Care Team: Kimber Relic, MD as PCP - General (Internal Medicine) Cassell Clement, MD as Referring Physician (Cardiology)   HPI  Erin Vang is a 77 y.o. female Seen in followup for atrial fibrillation and syncope with pauses of greater than 5 seconds and is status post pacemaker implantation 11/13  Her big complaint is fatigue. She also complained about the local anesthesia she got at the time of her device implantation   She is getting over a cold; she is on antibiotics. She is sob but says this is no different from normal   Past Medical History  Diagnosis Date  . Hypertension   . Osteoarthritis   . Hypercholesterolemia   . Paroxysmal atrial fibrillation     CHADS2: 2; managed with rate control and anticoagulation.   . Syncope and collapse     When patient stands up real quickly  . Normal cardiac stress test     a.  lexiscan MV - EF 72%, No ischemia  . Anemia     ? of lab error vs GI bleed  . COPD (chronic obstructive pulmonary disease)   . Weight loss   . Chronic anticoagulation     On Xarelto  . Pacemaker   . Pneumonia   . Exertional dyspnea   . Type II diabetes mellitus   . History of blood transfusion     "wasn't needed; had some bad lab results" (2012-03-05)  . Depression   . Prolonged grief reaction     "husband died 1 yr ago" (03-05-2012)  . Urinary incontinence   . Dizziness and giddiness 07/22/2012  . Benign paroxysmal positional vertigo 07/22/2012  . Tinnitus of left ear 07/22/2012  . Unspecified constipation   . Insomnia, unspecified   . Anxiety state, unspecified     Past Surgical History  Procedure Laterality Date  . Appendectomy  1952  . Cervical disc surgery  04/2009  . Hip arthroplasty  11/08/2011    Procedure: ARTHROPLASTY BIPOLAR HIP;  Surgeon: Shelda Pal, MD;  Location: [facility] OR;  Service: Orthopedics;  Laterality: Left;  . Insert / replace / remove pacemaker  03/05/12  . Tubal ligation  1974  . Cholecystectomy  1984  .  Polypectomy  11/21/1999    Endometrial polyps  . Eye surgery Right 03/2006    Cataract surgery  . Eye surgery  11/2004    Lid surgery    Current Outpatient Prescriptions  Medication Sig Dispense Refill  . Calcium Carbonate (CALCIUM 600 PO) Take 1 tablet by mouth daily.       . Cholecalciferol (VITAMIN D PO) Take 2,000 mg by mouth daily.        Marland Kitchen doxycycline (VIBRA-TABS) 100 MG tablet Take 1 tablet (100 mg total) by mouth 2 (two) times daily.  14 tablet  0  . Flaxseed, Linseed, (FLAX SEED OIL) 1000 MG CAPS Take 1,000 mg by mouth 3 (three) times daily.       Marland Kitchen glucose blood (BL TEST STRIP PACK) test strip Accucheck blood sugar test strips. Check blood sugar twice daily DX:250.02  300 each  3  . Lancets Thin MISC Use three times daily to check blood sugar daily to control diabetes. 250.00  100 each  5  . linagliptin (TRADJENTA) 5 MG TABS tablet Take one tablet once a day for diabetes 250.00  90 tablet  3  . metoprolol tartrate (LOPRESSOR) 25 MG tablet Take 25 mg by mouth as needed (1/2 tablet as needed  for heart rate above 125).       . Multiple Vitamin (MULTIVITAMIN PO) Take 1 tablet by mouth daily after breakfast.       . Rivaroxaban (XARELTO) 20 MG TABS tablet Take 1 tablet (20 mg total) by mouth daily.  90 tablet  1  . saccharomyces boulardii (FLORASTOR) 250 MG capsule Take 1 capsule (250 mg total) by mouth 2 (two) times daily.  30 capsule  0  . traZODone (DESYREL) 50 MG tablet Take 0.5-1 tablets (25-50 mg total) by mouth at bedtime.  30 tablet  3   No current facility-administered medications for this visit.    Allergies  Allergen Reactions  . Penicillins Hives and Rash  . Erythromycin Hives  . Neomycin-Bacitracin Zn-Polymyx Rash  . Sulfonamide Derivatives Hives and Rash  . Wool Alcohol [Lanolin Alcohol] Hives  . Nitrofurantoin Other (See Comments)    Unknown   . Nitrofurantoin Monohyd Macro Other (See Comments)    Unknown   . Sulfa Antibiotics     Review of Systems negative  except from HPI and PMH  Physical Exam BP 138/90  Pulse 112  Ht 5' 5.5" (1.664 m)  Wt 143 lb 6.4 oz (65.046 kg)  BMI 23.49 kg/m2 Well developed and nourished in mod resp distress  HENT normal Neck supple with JVP-flat Clear Device pocket well healed; without hematoma or erythema.  There is no tethering  Irregularly irregular rate and rhythm Abd-soft with active BS No Clubbing cyanosis edema Skin-warm and dry A & Oriented  Grossly normal sensory and motor function     Assessment and  Plan

## 2013-03-17 NOTE — Assessment & Plan Note (Signed)
The patient's device was interrogated.  The information was reviewed. No changes were made in the programming.    

## 2013-03-17 NOTE — Assessment & Plan Note (Signed)
Reasonably controlled 

## 2013-03-31 ENCOUNTER — Other Ambulatory Visit: Payer: Self-pay | Admitting: *Deleted

## 2013-03-31 DIAGNOSIS — E119 Type 2 diabetes mellitus without complications: Secondary | ICD-10-CM

## 2013-03-31 MED ORDER — LANCETS THIN MISC: Status: DC

## 2013-04-06 ENCOUNTER — Ambulatory Visit (INDEPENDENT_AMBULATORY_CARE_PROVIDER_SITE_OTHER): Payer: Medicare Other | Admitting: Cardiology

## 2013-04-06 ENCOUNTER — Encounter: Payer: Self-pay | Admitting: Cardiology

## 2013-04-06 VITALS — BP 123/79 | HR 105 | Ht 65.5 in

## 2013-04-06 DIAGNOSIS — I495 Sick sinus syndrome: Secondary | ICD-10-CM

## 2013-04-06 DIAGNOSIS — F329 Major depressive disorder, single episode, unspecified: Secondary | ICD-10-CM

## 2013-04-06 DIAGNOSIS — F341 Dysthymic disorder: Secondary | ICD-10-CM

## 2013-04-06 DIAGNOSIS — I119 Hypertensive heart disease without heart failure: Secondary | ICD-10-CM

## 2013-04-06 DIAGNOSIS — I4891 Unspecified atrial fibrillation: Secondary | ICD-10-CM

## 2013-04-06 DIAGNOSIS — Z950 Presence of cardiac pacemaker: Secondary | ICD-10-CM

## 2013-04-06 MED ORDER — DILTIAZEM HCL ER COATED BEADS 180 MG PO CP24: 180.0000 mg | ORAL_CAPSULE | Freq: Every day | ORAL | Status: DC

## 2013-04-06 NOTE — Progress Notes (Signed)
Erin Vang Date of Birth:  [birth year] [address] [place], [place]  [address] [phone]         Fax   [phone]  History of Present Illness: This pleasant 77 year old woman is seen for a scheduled followup office visit. She has a past history of tachybradycardia syndrome with chronic atrial fibrillation and is on Xarelto. She has a permanent pacemaker implanted by Dr. Graciela Husbands. . She is diabetic and has had some problems with hypoglycemia and this is followed by Dr. Murray Hodgkins.  Since last visit she has had some dizzy spells.  She had a recent pacer interrogation by Dr. Graciela Husbands which showed that her ventricular response to atrial fibrillation remains high.  Since her last visit with him she has been on diltiazem CD 120 mg daily.  She notes that her pulse is still elevated.  EKG today shows a heart rate varying between 105 and 117. Her last echocardiogram was in 2013 and at that time she had an ejection fraction of 55% and showed mild mitral regurgitation.  Current Outpatient Prescriptions  Medication Sig Dispense Refill  . Calcium Carbonate (CALCIUM 600 PO) Take 1 tablet by mouth daily.       . Cholecalciferol (VITAMIN D PO) Take 2,000 mg by mouth daily.        . Flaxseed, Linseed, (FLAX SEED OIL) 1000 MG CAPS Take 1,000 mg by mouth 3 (three) times daily.       Marland Kitchen glucose blood (BL TEST STRIP PACK) test strip Accucheck blood sugar test strips. Check blood sugar twice daily DX:250.02  300 each  3  . Lancets Thin MISC Use three times daily to check blood sugar daily to control diabetes. 250.00  270 each  3  . linagliptin (TRADJENTA) 5 MG TABS tablet Take one tablet once a day for diabetes 250.00  90 tablet  3  . Multiple Vitamin (MULTIVITAMIN PO) Take 1 tablet by mouth daily after breakfast.       . Rivaroxaban (XARELTO) 20 MG TABS tablet Take 1 tablet (20 mg total) by mouth daily.  90 tablet  1  . diltiazem (CARDIZEM CD) 180 MG 24 hr capsule Take 1 capsule (180 mg total)  by mouth daily.  90 capsule  3   No current facility-administered medications for this visit.    Allergies  Allergen Reactions  . Penicillins Hives and Rash  . Erythromycin Hives  . Neomycin-Bacitracin Zn-Polymyx Rash  . Sulfonamide Derivatives Hives and Rash  . Wool Alcohol [Lanolin Alcohol] Hives  . Nitrofurantoin Other (See Comments)    Unknown   . Nitrofurantoin Monohyd Macro Other (See Comments)    Unknown   . Sulfa Antibiotics     Patient Active Problem List   Diagnosis Date Noted  . Urge incontinence 02/12/2013  . Insomnia 08/13/2012  . Depression 08/04/2012  . Dizziness and giddiness 07/22/2012  . Tinnitus of left ear 07/22/2012  . Tachy-brady syndrome 03/01/2012  . Pacemaker-Medtronic 02/29/2012  . Closed left hip fracture 11/08/2011  . Reactive depression 05/25/2011  . Anorexia 05/25/2011  . Anemia 05/25/2011  . Weight loss, non-intentional 05/25/2011  . Constipation, chronic 05/25/2011  . Normal cardiac stress test   . Atrial fibrillation   . COPD (chronic obstructive pulmonary disease) 10/10/2010  . Benign hypertensive heart disease without heart failure 09/11/2010  . Osteoarthritis   . Hypercholesterolemia   . ALLERGIC RHINITIS 07/26/2009  . DIABETES MELLITUS, TYPE II 07/25/2009  . ANXIETY 07/25/2009  . HYPERTENSION 07/25/2009  .  DYSPNEA 07/25/2009    History  Smoking status  . Former Smoker -- 2.00 packs/day for 35 years  . Types: Cigarettes  . Quit date: 04/23/1986  Smokeless tobacco  . Never Used    History  Alcohol Use No    Family History  Problem Relation Age of Onset  . Arthritis Mother   . Kidney nephrosis Mother   . Ulcers Mother     Peptic  . Heart failure Mother     CHF  . Cancer Father     Kidney  . Early death Son     Self-inflicted gunshot    Review of Systems: Constitutional: no fever chills diaphoresis or fatigue or change in weight.  Head and neck: no hearing loss, no epistaxis, no photophobia or visual  disturbance. Respiratory: No cough, shortness of breath or wheezing. Cardiovascular: No chest pain peripheral edema, palpitations. Gastrointestinal: No abdominal distention, no abdominal pain, no change in bowel habits hematochezia or melena. Genitourinary: No dysuria, no frequency, no urgency, no nocturia. Musculoskeletal:No arthralgias, no back pain, no gait disturbance or myalgias. Neurological: No dizziness, no headaches, no numbness, no seizures, no syncope, no weakness, no tremors. Hematologic: No lymphadenopathy, no easy bruising. Psychiatric: No confusion, no hallucinations, no sleep disturbance.    Physical Exam: Filed Vitals:   04/06/13 1355  BP: 123/79  Pulse: 105   blood pressure supine is 124/80 supine right arm, 124/78 sitting and 128/82 standing.  The general appearance reveals a well-developed well-nourished elderly woman in no distress.The head and neck exam reveals pupils equal and reactive.  Extraocular movements are full.  There is no scleral icterus.  The mouth and pharynx are normal.  The neck is supple.  The carotids reveal no bruits.  The jugular venous pressure is normal.  The  thyroid is not enlarged.  There is no lymphadenopathy.  The chest is clear to percussion and auscultation.  There are no rales or rhonchi.  Expansion of the chest is symmetrical.  The pulse is rapid and irregular The precordium is quiet.  The first heart sound is normal.  The second heart sound is physiologically split.  There is no murmur gallop rub or click.  There is no abnormal lift or heave.  The abdomen is soft and nontender.  The bowel sounds are normal.  The liver and spleen are not enlarged.  There are no abdominal masses.  There are no abdominal bruits.  Extremities reveal good pedal pulses.  There is tenderness at the base of the right thumb and she states that she was told that she has a fracture from a previous fall.  There is no phlebitis or edema.  There is no cyanosis or clubbing.   Strength is normal and symmetrical in all extremities.  There is no lateralizing weakness.  There are no sensory deficits.  The skin is warm and dry.  There is no rash.   EKG shows atrial fibrillation with rapid ventricular response.  Occasional ventricular paced beats are seen.  Heart rate on today's EKGs varies between 105 and 117.  Assessment / Plan:  The patient is presently on diltiazem 120 mg one daily.  Her heart rate is still higher than desired.  We will increase her diltiazem up to 180 mg daily.  She has soft blood pressure at times and so I don't want to push her diltiazem dosage too high too quickly. We will get an interrogation of her pacemaker today as suggested by Dr. Graciela Husbands. Recheck for followup office visit  in 2 months

## 2013-04-06 NOTE — Assessment & Plan Note (Signed)
Her symptoms of reactive depression appeared to be improved since last visit

## 2013-04-06 NOTE — Assessment & Plan Note (Signed)
The patient is on Xarelto.  She has had no TIA symptoms.  She has had no further dizzy spells or syncope

## 2013-04-06 NOTE — Patient Instructions (Signed)
Increase Diltiazem CD 180 mg daily   Your physician recommends that you schedule a follow-up appointment in: 2 months

## 2013-04-06 NOTE — Assessment & Plan Note (Addendum)
The patient is not having any symptoms of congestive heart failure.  No headaches.  She has occasional dizzy spells which may be secondary to her fast ventricular response

## 2013-04-20 ENCOUNTER — Telehealth: Payer: Self-pay | Admitting: Cardiology

## 2013-04-20 NOTE — Telephone Encounter (Signed)
New message  Pt called requests a call back (No further details)

## 2013-04-20 NOTE — Telephone Encounter (Signed)
Pt c/o dizziness since starting increased Diltiazem at last OV with Dr. Patty Sermons, with no other symptoms. Reported BP last night 125/79, HR 87.  Pt has f/u w/ Dr. Graciela Husbands 05/05/13. Reviewed with DOD, Dr. Eden Emms, who recommends pt remain on current dosage until she sees Dr. Graciela Husbands. Pt would like Melinda Web designer) to call her this week to see how she is - will route this to her for follow up. Pt agreeable to plan.

## 2013-04-21 ENCOUNTER — Telehealth: Payer: Self-pay | Admitting: Internal Medicine

## 2013-04-21 NOTE — Telephone Encounter (Signed)
Pt called a few minutes after sending her transmission. I checked [facility] but report not received yet. She states she saw all green lights all the way to the "target."   I will check again tomorrow to verify if report received.

## 2013-04-21 NOTE — Telephone Encounter (Signed)
Patient will call back next week with update

## 2013-04-21 NOTE — Telephone Encounter (Signed)
Patient states that this dizziness started after increasing the diltiazem. Yesterday she called in to pharmacy to get the Diltiazem 120 mg refilled and is going to start back on those, wanted  Dr. Patty Sermons to be aware. Will forward to him for review.

## 2013-04-21 NOTE — Telephone Encounter (Signed)
Agree with plan 

## 2013-04-21 NOTE — Addendum Note (Signed)
Addended by: Regis Bill B on: 04/21/2013 09:37 AM   Modules accepted: Orders

## 2013-04-21 NOTE — Telephone Encounter (Signed)
New message     Did you get her remote pacer transmission ck from today?

## 2013-04-22 ENCOUNTER — Encounter: Payer: Self-pay | Admitting: Internal Medicine

## 2013-04-22 NOTE — Telephone Encounter (Signed)
Updated pt that transmission was received today.

## 2013-05-05 ENCOUNTER — Encounter: Payer: Self-pay | Admitting: Internal Medicine

## 2013-05-05 ENCOUNTER — Ambulatory Visit (INDEPENDENT_AMBULATORY_CARE_PROVIDER_SITE_OTHER): Payer: Medicare Other | Admitting: Internal Medicine

## 2013-05-05 VITALS — BP 122/80 | HR 124 | Ht 65.5 in | Wt 145.0 lb

## 2013-05-05 DIAGNOSIS — I119 Hypertensive heart disease without heart failure: Secondary | ICD-10-CM

## 2013-05-05 DIAGNOSIS — I4891 Unspecified atrial fibrillation: Secondary | ICD-10-CM

## 2013-05-05 DIAGNOSIS — I509 Heart failure, unspecified: Secondary | ICD-10-CM

## 2013-05-05 LAB — MDC_IDC_ENUM_SESS_TYPE_INCLINIC
Battery Remaining Longevity: 108 mo
Brady Statistic RV Percent Paced: 27.1 %
Lead Channel Impedance Value: 656 Ohm
Lead Channel Pacing Threshold Amplitude: 0.375 V
Lead Channel Pacing Threshold Pulse Width: 0.4 ms
Lead Channel Setting Pacing Amplitude: 2.5 V
MDC IDC MSMT BATTERY VOLTAGE: 2.78 V
MDC IDC MSMT LEADCHNL RV SENSING INTR AMPL: 11.2 mV
MDC IDC SET LEADCHNL RV PACING PULSEWIDTH: 0.4 ms
MDC IDC SET LEADCHNL RV SENSING SENSITIVITY: 5.6 mV

## 2013-05-05 NOTE — Patient Instructions (Signed)
Your physician has requested that you have an echocardiogram. Echocardiography is a painless test that uses sound waves to create images of your heart. It provides your doctor with information about the size and shape of your heart and how well your heart's chambers and valves are working. This procedure takes approximately one hour. There are no restrictions for this procedure.   AV Node Ablation for 1/28 -- Erin Curet, RN will call you to set this procedure up.  Your physician recommends that you continue on your current medications as directed. Please refer to the Current Medication list given to you today.

## 2013-05-05 NOTE — Progress Notes (Signed)
Patient Care Team: Estill Dooms, MD as PCP - General (Internal Medicine) Darlin Coco, MD as Referring Physician (Cardiology)   HPI  Erin Vang is a 78 y.o. female  Seen in followup for atrial fibrillation-permanent and syncope with pauses of greater than 5 seconds and is status post pacemaker implantation 11/13  Her big complaint is fatigue.\ She has not tolerated augmented rate control because of dizziness and syncope and full. She continues to struggle with exercise intolerance there is a peripheral edema.   Past Medical History  Diagnosis Date  . Hypertension   . Osteoarthritis   . Hypercholesterolemia   . Paroxysmal atrial fibrillation     CHADS2: 2; managed with rate control and anticoagulation.   . Syncope and collapse     When patient stands up real quickly  . Normal cardiac stress test     a.  lexiscan MV - EF 72%, No ischemia  . Anemia     ? of lab error vs GI bleed  . COPD (chronic obstructive pulmonary disease)   . Weight loss   . Chronic anticoagulation     On Xarelto  . Pacemaker   . Pneumonia   . Exertional dyspnea   . Type II diabetes mellitus   . History of blood transfusion     "wasn't needed; had some bad lab results" (03-19-2012)  . Depression   . Prolonged grief reaction     "husband died 1 yr ago" (2012-03-19)  . Urinary incontinence   . Dizziness and giddiness 07/22/2012  . Benign paroxysmal positional vertigo 07/22/2012  . Tinnitus of left ear 07/22/2012  . Unspecified constipation   . Insomnia, unspecified   . Anxiety state, unspecified     Past Surgical History  Procedure Laterality Date  . Appendectomy  1952  . Cervical disc surgery  04/2009  . Hip arthroplasty  11/08/2011    Procedure: ARTHROPLASTY BIPOLAR HIP;  Surgeon: Mauri Pole, MD;  Location: [facility];  Service: Orthopedics;  Laterality: Left;  . Insert / replace / remove pacemaker  03/19/12  . Tubal ligation  1974  . Cholecystectomy  1984  . Polypectomy  11/21/1999      Endometrial polyps  . Eye surgery Right 03/2006    Cataract surgery  . Eye surgery  11/2004    Lid surgery    Current Outpatient Prescriptions  Medication Sig Dispense Refill  . Calcium Carbonate (CALCIUM 600 PO) Take 1 tablet by mouth daily.       . Cholecalciferol (VITAMIN D PO) Take 2,000 mg by mouth daily.        . Flaxseed, Linseed, (FLAX SEED OIL) 1000 MG CAPS Take 1,000 mg by mouth 3 (three) times daily.       Marland Kitchen glucose blood (BL TEST STRIP PACK) test strip Accucheck blood sugar test strips. Check blood sugar twice daily DX:250.02  300 each  3  . Lancets Thin MISC Use three times daily to check blood sugar daily to control diabetes. 250.00  270 each  3  . linagliptin (TRADJENTA) 5 MG TABS tablet Take one tablet once a day for diabetes 250.00  90 tablet  3  . Multiple Vitamin (MULTIVITAMIN PO) Take 1 tablet by mouth daily after breakfast.       . Rivaroxaban (XARELTO) 20 MG TABS tablet Take 1 tablet (20 mg total) by mouth daily.  90 tablet  1  . diltiazem (CARDIZEM LA) 120 MG 24 hr tablet Take 120 mg  by mouth daily.       No current facility-administered medications for this visit.    Allergies  Allergen Reactions  . Penicillins Hives and Rash  . Erythromycin Hives  . Neomycin-Bacitracin Zn-Polymyx Rash  . Sulfonamide Derivatives Hives and Rash  . Wool Alcohol [Lanolin Alcohol] Hives  . Nitrofurantoin Other (See Comments)    Unknown   . Nitrofurantoin Monohyd Macro Other (See Comments)    Unknown   . Sulfa Antibiotics     Review of Systems negative except from HPI and PMH  Physical Exam BP 122/80  Pulse 124  Ht 5' 5.5" (1.664 m)  Wt 145 lb (65.772 kg)  BMI 23.75 kg/m2 Well developed and well nourished in no acute distress HENT normal E scleral and icterus clear Neck Supple JVP8-10 carotids brisk and full  Regular rate and rhythm, no murmurs PMI displacedSoft with active bowel sounds No clubbing cyanosis no Edema Alert and oriented, grossly normal motor and  sensory function Skin Warm and Dry    Assessment and  Plan

## 2013-05-05 NOTE — Assessment & Plan Note (Signed)
The patient's symptoms relate to rapid rates. I do not know there has been interval decrease in her left ventricular function. It may well be the case. We will plan to repeat an echo following AV junction ablation

## 2013-05-05 NOTE — Assessment & Plan Note (Signed)
As above.

## 2013-05-05 NOTE — Assessment & Plan Note (Signed)
She is inadequately controlled heart rates with atrial fibrillation with rate control efforts foiled. by hypotension and presyncope  AV junction ablation is our only option. A decision was made sometimes notably her atrial fibrillation. She does not have atrial lead. Normally her atrial dimension was not all that large a couple years ago, i.e. less than 2.0. However, given the lack of atrial lead AV junction ablation seems the right next step.

## 2013-05-05 NOTE — Assessment & Plan Note (Signed)
Blood pressure is not an issue

## 2013-05-06 ENCOUNTER — Telehealth: Payer: Self-pay | Admitting: Internal Medicine

## 2013-05-06 NOTE — Telephone Encounter (Signed)
New message     Pt saw Dr Caryl Comes yesterday and thinks she gave him the wrong phone number for her son, Laverna Peace. This is the correct number [phone].

## 2013-05-12 ENCOUNTER — Encounter (HOSPITAL_COMMUNITY): Payer: Self-pay | Admitting: Pharmacy Technician

## 2013-05-12 ENCOUNTER — Encounter: Payer: Self-pay | Admitting: *Deleted

## 2013-05-12 ENCOUNTER — Other Ambulatory Visit: Payer: Self-pay | Admitting: *Deleted

## 2013-05-12 DIAGNOSIS — I4891 Unspecified atrial fibrillation: Secondary | ICD-10-CM

## 2013-05-12 DIAGNOSIS — Z01812 Encounter for preprocedural laboratory examination: Secondary | ICD-10-CM

## 2013-05-12 NOTE — Telephone Encounter (Signed)
Follow up     Patient son calling would like to speak with nurse regarding his mother.

## 2013-05-12 NOTE — Telephone Encounter (Signed)
Discussed anesthesia with pt son (per her ok). I explained that I would call his mom back to reschedule procedure (per ok from Dr. Caryl Comes) w/ anesthesia

## 2013-05-12 NOTE — Telephone Encounter (Signed)
Advised pt that number was updated in our system. Also discussed upcoming ablation procedure for 1/28, went over instructions, lab work Friday.  Pt asking about anesthesia with procedure - will discuss with Dr. Caryl Comes and get back with her. Pt agreeable to plan.

## 2013-05-13 ENCOUNTER — Encounter: Payer: Self-pay | Admitting: *Deleted

## 2013-05-13 NOTE — Telephone Encounter (Signed)
Advised pt she is rescheduled for 1/30 w/ anesthesia. Be at hospital at 10am. Left message on son's answering maching (per request) advising him of change. She is agreeable with updated plan.

## 2013-05-14 ENCOUNTER — Telehealth: Payer: Self-pay | Admitting: *Deleted

## 2013-05-14 NOTE — Telephone Encounter (Signed)
Pt c/o dizzy and would like to stop Cardizem. Reviewed with Dr. Caryl Comes - he would like her to wait until ablation on 1/30, but if she cannot wait that long it would be ok to stop now if she needs to.  Discussed this with her, pt is going to try and stay on medication.

## 2013-05-15 ENCOUNTER — Other Ambulatory Visit (INDEPENDENT_AMBULATORY_CARE_PROVIDER_SITE_OTHER): Payer: Medicare Other

## 2013-05-15 DIAGNOSIS — I4891 Unspecified atrial fibrillation: Secondary | ICD-10-CM

## 2013-05-15 DIAGNOSIS — Z01812 Encounter for preprocedural laboratory examination: Secondary | ICD-10-CM

## 2013-05-15 LAB — CBC WITH DIFFERENTIAL/PLATELET
Basophils Absolute: 0.1 10*3/uL (ref 0.0–0.1)
Basophils Relative: 1.2 % (ref 0.0–3.0)
Eosinophils Absolute: 0.1 10*3/uL (ref 0.0–0.7)
Eosinophils Relative: 1.6 % (ref 0.0–5.0)
HCT: 40.2 % (ref 36.0–46.0)
HEMOGLOBIN: 13.4 g/dL (ref 12.0–15.0)
Lymphocytes Relative: 20.7 % (ref 12.0–46.0)
Lymphs Abs: 1.4 10*3/uL (ref 0.7–4.0)
MCHC: 33.3 g/dL (ref 30.0–36.0)
MCV: 88.3 fl (ref 78.0–100.0)
Monocytes Absolute: 0.8 10*3/uL (ref 0.1–1.0)
Monocytes Relative: 12.1 % — ABNORMAL HIGH (ref 3.0–12.0)
NEUTROS ABS: 4.4 10*3/uL (ref 1.4–7.7)
Neutrophils Relative %: 64.4 % (ref 43.0–77.0)
Platelets: 226 10*3/uL (ref 150.0–400.0)
RBC: 4.55 Mil/uL (ref 3.87–5.11)
RDW: 13.9 % (ref 11.5–14.6)
WBC: 6.8 10*3/uL (ref 4.5–10.5)

## 2013-05-15 LAB — BASIC METABOLIC PANEL
BUN: 13 mg/dL (ref 6–23)
CHLORIDE: 105 meq/L (ref 96–112)
CO2: 27 meq/L (ref 19–32)
CREATININE: 1.1 mg/dL (ref 0.4–1.2)
Calcium: 9.6 mg/dL (ref 8.4–10.5)
GFR: 54.18 mL/min — ABNORMAL LOW (ref >60.00)
Glucose, Bld: 181 mg/dL — ABNORMAL HIGH (ref 70–99)
POTASSIUM: 4 meq/L (ref 3.5–5.1)
Sodium: 141 mEq/L (ref 135–145)

## 2013-05-17 NOTE — Progress Notes (Signed)
Quick Note:  Please report to patient. The recent labs are stable. Continue same medication and careful diet. BS higher. Watch carbs. ______

## 2013-05-19 ENCOUNTER — Telehealth: Payer: Self-pay | Admitting: *Deleted

## 2013-05-19 NOTE — Telephone Encounter (Signed)
Advised patient of lab results  

## 2013-05-19 NOTE — Telephone Encounter (Signed)
Message copied by Earvin Hansen on Tue May 19, 2013 11:23 AM ------      Message from: Darlin Coco      Created: Sun May 17, 2013  6:30 PM       Please report to patient.  The recent labs are stable. Continue same medication and careful diet. BS higher. Watch carbs. ------

## 2013-05-22 ENCOUNTER — Encounter (HOSPITAL_COMMUNITY): Payer: Self-pay | Admitting: Anesthesiology

## 2013-05-22 ENCOUNTER — Ambulatory Visit (HOSPITAL_COMMUNITY)
Admission: RE | Admit: 2013-05-22 | Discharge: 2013-05-23 | Disposition: A | Discharge status: Home / Self Care | Payer: Medicare Other | Source: Ambulatory Visit | Attending: Internal Medicine | Admitting: Internal Medicine

## 2013-05-22 ENCOUNTER — Ambulatory Visit (HOSPITAL_COMMUNITY): Payer: Medicare Other | Admitting: Anesthesiology

## 2013-05-22 ENCOUNTER — Encounter (HOSPITAL_COMMUNITY)
Admission: RE | Disposition: A | Discharge status: Home / Self Care | Payer: Self-pay | Source: Ambulatory Visit | Attending: Internal Medicine

## 2013-05-22 ENCOUNTER — Encounter (HOSPITAL_COMMUNITY): Payer: Medicare Other | Admitting: Anesthesiology

## 2013-05-22 DIAGNOSIS — I10 Essential (primary) hypertension: Secondary | ICD-10-CM | POA: Insufficient documentation

## 2013-05-22 DIAGNOSIS — I495 Sick sinus syndrome: Secondary | ICD-10-CM

## 2013-05-22 DIAGNOSIS — J449 Chronic obstructive pulmonary disease, unspecified: Secondary | ICD-10-CM | POA: Insufficient documentation

## 2013-05-22 DIAGNOSIS — F3289 Other specified depressive episodes: Secondary | ICD-10-CM | POA: Insufficient documentation

## 2013-05-22 DIAGNOSIS — Z7901 Long term (current) use of anticoagulants: Secondary | ICD-10-CM | POA: Insufficient documentation

## 2013-05-22 DIAGNOSIS — J4489 Other specified chronic obstructive pulmonary disease: Secondary | ICD-10-CM | POA: Insufficient documentation

## 2013-05-22 DIAGNOSIS — I4891 Unspecified atrial fibrillation: Secondary | ICD-10-CM

## 2013-05-22 DIAGNOSIS — Z87891 Personal history of nicotine dependence: Secondary | ICD-10-CM | POA: Insufficient documentation

## 2013-05-22 DIAGNOSIS — I442 Atrioventricular block, complete: Secondary | ICD-10-CM | POA: Diagnosis present

## 2013-05-22 DIAGNOSIS — I509 Heart failure, unspecified: Secondary | ICD-10-CM | POA: Insufficient documentation

## 2013-05-22 DIAGNOSIS — F411 Generalized anxiety disorder: Secondary | ICD-10-CM | POA: Insufficient documentation

## 2013-05-22 DIAGNOSIS — M1990 Unspecified osteoarthritis, unspecified site: Secondary | ICD-10-CM | POA: Insufficient documentation

## 2013-05-22 DIAGNOSIS — E119 Type 2 diabetes mellitus without complications: Secondary | ICD-10-CM | POA: Insufficient documentation

## 2013-05-22 DIAGNOSIS — E7800 Pure hypercholesterolemia, unspecified: Secondary | ICD-10-CM | POA: Insufficient documentation

## 2013-05-22 DIAGNOSIS — Z950 Presence of cardiac pacemaker: Secondary | ICD-10-CM | POA: Diagnosis present

## 2013-05-22 DIAGNOSIS — F329 Major depressive disorder, single episode, unspecified: Secondary | ICD-10-CM | POA: Insufficient documentation

## 2013-05-22 HISTORY — PX: ABLATION: SHX5711

## 2013-05-22 HISTORY — PX: AV NODE ABLATION: SHX5458

## 2013-05-22 LAB — GLUCOSE, CAPILLARY
GLUCOSE-CAPILLARY: 133 mg/dL — AB (ref 70–99)
Glucose-Capillary: 87 mg/dL (ref 70–99)
Glucose-Capillary: 92 mg/dL (ref 70–99)
Glucose-Capillary: 163 mg/dL — ABNORMAL HIGH (ref 70–99)

## 2013-05-22 LAB — MRSA PCR SCREENING: MRSA by PCR: NEGATIVE

## 2013-05-22 SURGERY — AV NODE ABLATION
Anesthesia: Monitor Anesthesia Care

## 2013-05-22 MED ORDER — SODIUM CHLORIDE 0.9 % IJ SOLN
3.0000 mL | Freq: Two times a day (BID) | INTRAMUSCULAR | Status: DC
  Administered 2013-05-22 – 2013-05-23 (×2): 3 mL via INTRAVENOUS

## 2013-05-22 MED ORDER — BUPIVACAINE HCL (PF) 0.25 % IJ SOLN
INTRAMUSCULAR | Status: AC
  Filled 2013-05-22: qty 30

## 2013-05-22 MED ORDER — PROMETHAZINE HCL 25 MG/ML IJ SOLN: 6.2500 mg | INTRAMUSCULAR | Status: DC | PRN

## 2013-05-22 MED ORDER — HYDROMORPHONE HCL PF 1 MG/ML IJ SOLN: 0.2500 mg | INTRAMUSCULAR | Status: DC | PRN

## 2013-05-22 MED ORDER — SODIUM CHLORIDE 0.9 % IV SOLN
INTRAVENOUS | Status: DC | PRN
  Administered 2013-05-22: 13:00:00 via INTRAVENOUS

## 2013-05-22 MED ORDER — OXYCODONE HCL 5 MG PO TABS: 5.0000 mg | ORAL_TABLET | Freq: Once | ORAL | Status: AC | PRN

## 2013-05-22 MED ORDER — ACETAMINOPHEN 325 MG PO TABS: 650.0000 mg | ORAL_TABLET | ORAL | Status: DC | PRN

## 2013-05-22 MED ORDER — DILTIAZEM HCL ER COATED BEADS 120 MG PO CP24
120.0000 mg | ORAL_CAPSULE | Freq: Every day | ORAL | Status: DC
  Filled 2013-05-22: qty 1

## 2013-05-22 MED ORDER — ONDANSETRON HCL 4 MG/2ML IJ SOLN: 4.0000 mg | Freq: Four times a day (QID) | INTRAMUSCULAR | Status: DC | PRN

## 2013-05-22 MED ORDER — SODIUM CHLORIDE 0.9 % IJ SOLN: 3.0000 mL | INTRAMUSCULAR | Status: DC | PRN

## 2013-05-22 MED ORDER — MIDAZOLAM HCL 5 MG/5ML IJ SOLN
INTRAMUSCULAR | Status: DC | PRN
  Administered 2013-05-22: 1 mg via INTRAVENOUS

## 2013-05-22 MED ORDER — PROPOFOL INFUSION 10 MG/ML OPTIME
INTRAVENOUS | Status: DC | PRN
  Administered 2013-05-22: 50 ug/kg/min via INTRAVENOUS

## 2013-05-22 MED ORDER — OXYCODONE HCL 5 MG/5ML PO SOLN: 5.0000 mg | Freq: Once | ORAL | Status: AC | PRN

## 2013-05-22 MED ORDER — ONDANSETRON HCL 4 MG/2ML IJ SOLN
INTRAMUSCULAR | Status: DC | PRN
  Administered 2013-05-22: 4 mg via INTRAVENOUS

## 2013-05-22 MED ORDER — RIVAROXABAN 20 MG PO TABS
20.0000 mg | ORAL_TABLET | Freq: Every day | ORAL | Status: DC
  Administered 2013-05-22: 20 mg via ORAL
  Filled 2013-05-22 (×2): qty 1

## 2013-05-22 MED ORDER — SODIUM CHLORIDE 0.9 % IV SOLN: 250.0000 mL | INTRAVENOUS | Status: DC | PRN

## 2013-05-22 MED ORDER — LIDOCAINE HCL (CARDIAC) 20 MG/ML IV SOLN
INTRAVENOUS | Status: DC | PRN
  Administered 2013-05-22: 40 mg via INTRAVENOUS

## 2013-05-22 MED ORDER — LINAGLIPTIN 5 MG PO TABS
5.0000 mg | ORAL_TABLET | Freq: Every day | ORAL | Status: DC
  Administered 2013-05-22 – 2013-05-23 (×2): 5 mg via ORAL
  Filled 2013-05-22 (×2): qty 1

## 2013-05-22 MED ORDER — SODIUM CHLORIDE 0.9 % IV SOLN
INTRAVENOUS | Status: DC
  Administered 2013-05-22: 11:00:00 via INTRAVENOUS

## 2013-05-22 NOTE — Anesthesia Postprocedure Evaluation (Signed)
Anesthesia Post Note  Patient: Erin Vang  Procedure(s) Performed: Procedure(s) (LRB): AV NODE ABLATION (N/A)  Anesthesia type: MAC  Patient location: PACU  Post pain: Pain level controlled  Post assessment: Patient's Cardiovascular Status Stable  Last Vitals:  Filed Vitals:   05/22/13 1015  BP: 144/77  Pulse: 78  Temp: 36.7 C  Resp: 18    Post vital signs: Reviewed and stable  Level of consciousness: sedated  Complications: No apparent anesthesia complications

## 2013-05-22 NOTE — Transfer of Care (Signed)
Immediate Anesthesia Transfer of Care Note  Patient: Erin Vang  Procedure(s) Performed: Procedure(s): AV NODE ABLATION (N/A)  Patient Location: PACU  Anesthesia Type:MAC  Level of Consciousness: awake, alert , oriented and patient cooperative  Airway & Oxygen Therapy: Patient Spontanous Breathing and Patient connected to face mask oxygen  Post-op Assessment: Report given to PACU RN and Post -op Vital signs reviewed and stable  Post vital signs: Reviewed  Complications: No apparent anesthesia complications

## 2013-05-22 NOTE — Interval H&P Note (Signed)
History and Physical Interval Note:  05/22/2013 12:28 PM  Erin Vang  has presented today for surgery, with the diagnosis of Afib  The various methods of treatment have been discussed with the patient and family. After consideration of risks, benefits and other options for treatment, the patient has consented to  Procedure(s): AV NODE ABLATION (N/A) as a surgical intervention .  The patient's history has been reviewed, patient examined, no change in status, stable for surgery.  I have reviewed the patient's chart and labs.  Questions were answered to the patient's satisfaction.     Virl Axe

## 2013-05-22 NOTE — Preoperative (Signed)
Beta Blockers   Reason not to administer Beta Blockers:Not Applicable 

## 2013-05-22 NOTE — CV Procedure (Signed)
Erin Vang [id number]  [id number]  Preop Dx:AF RVR   Postop Dx same/Complete heart block with junctional escape  H  Procedure:HV measurement AV ablation  Pacer reprogramming  Following the obtaining of informed consent routine prep and ddrape of Left groin>> venous cannulation with 72F 44mm catheter to AV junction Catheter removed sheath replaced and patient transferred to holding area for sheath removal Tolerated without difficulty   HV measurement 42 msec RF   Flouro time 8.0 min RF time 6.27 min  Cx: None     Virl Axe, MD 05/22/2013 2:47 PM

## 2013-05-22 NOTE — Anesthesia Procedure Notes (Signed)
Procedure Name: MAC Date/Time: 05/22/2013 1:44 PM Performed by: Jenne Campus Pre-anesthesia Checklist: Patient identified, Emergency Drugs available, Suction available, Timeout performed and Patient being monitored Patient Re-evaluated:Patient Re-evaluated prior to inductionOxygen Delivery Method: Simple face mask

## 2013-05-22 NOTE — Discharge Instructions (Signed)
No driving for 3 days. No lifting over 5 lbs for 1 week. No sexual activity for 1 week. Keep procedure site clean & dry. If you notice increased pain, swelling, bleeding or pus, call/return! You may shower, but no soaking baths/hot tubs/pools for 1 week. ° °

## 2013-05-22 NOTE — H&P (View-Only) (Signed)
Patient Care Team: Estill Dooms, MD as PCP - General (Internal Medicine) Darlin Coco, MD as Referring Physician (Cardiology)   HPI  ROBERTA Vang is a 78 y.o. female  Seen in followup for atrial fibrillation-permanent and syncope with pauses of greater than 5 seconds and is status post pacemaker implantation 11/13  Her big complaint is fatigue.\ She has not tolerated augmented rate control because of dizziness and syncope and full. She continues to struggle with exercise intolerance there is a peripheral edema.   Past Medical History  Diagnosis Date  . Hypertension   . Osteoarthritis   . Hypercholesterolemia   . Paroxysmal atrial fibrillation     CHADS2: 2; managed with rate control and anticoagulation.   . Syncope and collapse     When patient stands up real quickly  . Normal cardiac stress test     a.  lexiscan MV - EF 72%, No ischemia  . Anemia     ? of lab error vs GI bleed  . COPD (chronic obstructive pulmonary disease)   . Weight loss   . Chronic anticoagulation     On Xarelto  . Pacemaker   . Pneumonia   . Exertional dyspnea   . Type II diabetes mellitus   . History of blood transfusion     "wasn't needed; had some bad lab results" (03-19-2012)  . Depression   . Prolonged grief reaction     "husband died 1 yr ago" (2012-03-19)  . Urinary incontinence   . Dizziness and giddiness 07/22/2012  . Benign paroxysmal positional vertigo 07/22/2012  . Tinnitus of left ear 07/22/2012  . Unspecified constipation   . Insomnia, unspecified   . Anxiety state, unspecified     Past Surgical History  Procedure Laterality Date  . Appendectomy  1952  . Cervical disc surgery  04/2009  . Hip arthroplasty  11/08/2011    Procedure: ARTHROPLASTY BIPOLAR HIP;  Surgeon: Mauri Pole, MD;  Location: [facility];  Service: Orthopedics;  Laterality: Left;  . Insert / replace / remove pacemaker  03/19/12  . Tubal ligation  1974  . Cholecystectomy  1984  . Polypectomy  11/21/1999      Endometrial polyps  . Eye surgery Right 03/2006    Cataract surgery  . Eye surgery  11/2004    Lid surgery    Current Outpatient Prescriptions  Medication Sig Dispense Refill  . Calcium Carbonate (CALCIUM 600 PO) Take 1 tablet by mouth daily.       . Cholecalciferol (VITAMIN D PO) Take 2,000 mg by mouth daily.        . Flaxseed, Linseed, (FLAX SEED OIL) 1000 MG CAPS Take 1,000 mg by mouth 3 (three) times daily.       Marland Kitchen glucose blood (BL TEST STRIP PACK) test strip Accucheck blood sugar test strips. Check blood sugar twice daily DX:250.02  300 each  3  . Lancets Thin MISC Use three times daily to check blood sugar daily to control diabetes. 250.00  270 each  3  . linagliptin (TRADJENTA) 5 MG TABS tablet Take one tablet once a day for diabetes 250.00  90 tablet  3  . Multiple Vitamin (MULTIVITAMIN PO) Take 1 tablet by mouth daily after breakfast.       . Rivaroxaban (XARELTO) 20 MG TABS tablet Take 1 tablet (20 mg total) by mouth daily.  90 tablet  1  . diltiazem (CARDIZEM LA) 120 MG 24 hr tablet Take 120 mg  by mouth daily.       No current facility-administered medications for this visit.    Allergies  Allergen Reactions  . Penicillins Hives and Rash  . Erythromycin Hives  . Neomycin-Bacitracin Zn-Polymyx Rash  . Sulfonamide Derivatives Hives and Rash  . Wool Alcohol [Lanolin Alcohol] Hives  . Nitrofurantoin Other (See Comments)    Unknown   . Nitrofurantoin Monohyd Macro Other (See Comments)    Unknown   . Sulfa Antibiotics     Review of Systems negative except from HPI and PMH  Physical Exam BP 122/80  Pulse 124  Ht 5' 5.5" (1.664 m)  Wt 145 lb (65.772 kg)  BMI 23.75 kg/m2 Well developed and well nourished in no acute distress HENT normal E scleral and icterus clear Neck Supple JVP8-10 carotids brisk and full  Regular rate and rhythm, no murmurs PMI displacedSoft with active bowel sounds No clubbing cyanosis no Edema Alert and oriented, grossly normal motor and  sensory function Skin Warm and Dry    Assessment and  Plan  

## 2013-05-22 NOTE — Discharge Summary (Signed)
ELECTROPHYSIOLOGY DISCHARGE SUMMARY    [name],  MRN: [id number], DOB/AGE: 30-May-1935 78 y.o.  Admit date: 05/22/2013 Discharge date: 05/23/2013  Primary Care Physician: Jeanmarie Hubert, MD Primary Cardiologist: Mare Ferrari, MD Primary EP: Caryl Comes, MD  Primary Discharge Diagnosis:  1. Symptomatic, permanent atrial fibrillation with inadequate rate control s/p AV node ablation 2. Symptomatic bradycardia with syncope and pauses >5 seconds s/p PPM implantation previously  Secondary Discharge Diagnoses:  1. HTN 2. Dyslipidemia 3. COPD 4. DM 5. Osteoarthritis  Procedures This Admission:  1. HV measurement, AV node ablation with pacemaker reprogramming 05/22/2013 Please see operative report for full details  History and Hospital Course:  [name] is a 78 year old woman with permanent atrial fibrillation. She has symptomatic bradycardia with syncope and documented pauses >5 seconds s/p PPM implant in 2013. Her V rates in atrial fibrillation have been inadequately controlled. Rate control efforts have been limited by hypotension and near-syncope. She was seen in follow-up with Dr. Caryl Comes on 05/05/2013 at which time AV node ablation was recommended. She presented yesterday and underwent HV measurement, AV node ablation and pacemaker reprogramming. [name] tolerated this procedure well without any immediate complication. She remains hemodynamically stable and afebrile. Her groin site is intact without significant bleeding or hematoma. She has been given discharge instructions including wound care and activity restrictions. She will follow-up in device clinic in 2 weeks for pacemaker check. She has been seen, examined and deemed stable for discharge today by Dr. Thompson Grayer.  Discharge Vitals:  Filed Vitals:   05/23/13 0200 05/23/13 0300 05/23/13 0400 05/23/13 0722  BP: 118/60 124/68 135/75 139/77  Pulse: 90 90 90 90  Temp:  98.1 F (36.7 C)  98.2 F (36.8 C)  TempSrc:   Oral  Oral  Resp: 15 13 14 16   Height:      Weight:      SpO2: 96% 97% 96% 97%    GEN- The patient is well appearing, alert and oriented x 3 today.   Head- normocephalic, atraumatic Eyes-  Sclera clear, conjunctiva pink Ears- hearing intact Oropharynx- clear Neck- supple, no JVP Lymph- no cervical lymphadenopathy Lungs- Clear to ausculation bilaterally, normal work of breathing Heart- Regular rate and rhythm (paced) GI- soft, NT, ND, + BS Extremities- no clubbing, cyanosis, or edema, no hematoma/ bruits Neuro- strength and sensation are intact  Tele- V paced with afib  Labs: Lab Results  Component Value Date   WBC 6.8 05/15/2013   HGB 13.4 05/15/2013   HCT 40.2 05/15/2013   MCV 88.3 05/15/2013   PLT 226.0 05/15/2013      Component Value Date/Time   NA 141 05/15/2013 1043   NA 145* 02/12/2013 1539   K 4.0 05/15/2013 1043   CL 105 05/15/2013 1043   CO2 27 05/15/2013 1043   GLUCOSE 181* 05/15/2013 1043   GLUCOSE 121* 02/12/2013 1539   BUN 13 05/15/2013 1043   BUN 26 02/12/2013 1539   CREATININE 1.1 05/15/2013 1043   CALCIUM 9.6 05/15/2013 1043   GFRNONAA 55* 02/12/2013 1539   GFRAA 64 02/12/2013 1539    Disposition:  The patient is being discharged in stable condition.  Follow-up:     Follow-up Information   Follow up with [facility] On 06/04/2013. (At 10:00 AM for pacemaker check)    Specialty:  Cardiology   Contact information:   [address] [place] [address] [phone]      Follow up with [facility]  Mare Ferrari, MD On 07/09/2013. (At 2:45 PM )    Specialty:  Cardiology   Contact information:   [address] [place] [address] [phone]      Discharge Medications:    Medication List    STOP taking these medications       diltiazem 120 MG 24 hr capsule  Commonly known as:  CARDIZEM CD      TAKE these medications       CALCIUM PO  Take 1 tablet by mouth daily.     FLAX SEED OIL PO  Take 1  capsule by mouth daily.     glucose blood test strip  Commonly known as:  BL TEST STRIP PACK  Accucheck blood sugar test strips. Check blood sugar twice daily DX:250.02     Lancets Thin Misc  Use three times daily to check blood sugar daily to control diabetes. 250.00     linagliptin 5 MG Tabs tablet  Commonly known as:  TRADJENTA  Take 5 mg by mouth daily.     MULTIVITAMIN PO  Take 1 tablet by mouth daily after breakfast.     Rivaroxaban 20 MG Tabs tablet  Commonly known as:  XARELTO  Take 1 tablet (20 mg total) by mouth daily.     VITAMIN D PO  Take 2,000 Units by mouth daily.       Duration of Discharge Encounter: Greater than 30 minutes including physician time.  Signed, Thompson Grayer, MD  05/23/2013, 9:41 AM

## 2013-05-22 NOTE — Anesthesia Preprocedure Evaluation (Addendum)
Anesthesia Evaluation  Patient identified by MRN, date of birth, ID band Patient awake    Reviewed: Allergy & Precautions, H&P , NPO status , Patient's Chart, lab work & pertinent test results  History of Anesthesia Complications Negative for: history of anesthetic complications  Airway Mallampati: II TM Distance: >3 FB Neck ROM: Full    Dental  (+) Dental Advisory Given and Upper Dentures   Pulmonary pneumonia -, COPDformer smoker,  breath sounds clear to auscultation  Pulmonary exam normal       Cardiovascular hypertension, Pt. on medications +CHF + dysrhythmias Atrial Fibrillation + pacemaker     Neuro/Psych PSYCHIATRIC DISORDERS Anxiety Depression negative neurological ROS     GI/Hepatic negative GI ROS, Neg liver ROS,   Endo/Other  diabetes, Type 2cbg = 133 @ 1037  Renal/GU negative Renal ROS     Musculoskeletal negative musculoskeletal ROS (+)   Abdominal   Peds  Hematology   Anesthesia Other Findings   Reproductive/Obstetrics negative OB ROS                         Anesthesia Physical Anesthesia Plan  ASA: III  Anesthesia Plan: MAC   Post-op Pain Management:    Induction: Intravenous  Airway Management Planned: Simple Face Mask  Additional Equipment:   Intra-op Plan:   Post-operative Plan:   Informed Consent: I have reviewed the patients History and Physical, chart, labs and discussed the procedure including the risks, benefits and alternatives for the proposed anesthesia with the patient or authorized representative who has indicated his/her understanding and acceptance.   Dental advisory given  Plan Discussed with: CRNA, Anesthesiologist and Surgeon  Anesthesia Plan Comments:         Anesthesia Quick Evaluation

## 2013-05-23 ENCOUNTER — Encounter (HOSPITAL_COMMUNITY): Payer: Self-pay | Admitting: *Deleted

## 2013-05-23 DIAGNOSIS — I4891 Unspecified atrial fibrillation: Secondary | ICD-10-CM

## 2013-05-23 DIAGNOSIS — I495 Sick sinus syndrome: Secondary | ICD-10-CM

## 2013-05-23 LAB — GLUCOSE, CAPILLARY: Glucose-Capillary: 112 mg/dL — ABNORMAL HIGH (ref 70–99)

## 2013-05-23 NOTE — Progress Notes (Signed)
   Patient Name: Erin Vang Date of Encounter: 05/23/2013     Active Problems:   Atrial fibrillation   Pacemaker-[employer]   Atrioventricular block, complete    SUBJECTIVE   S/p AV node ablation yesterday. Patient is feeling good. No palpitations, lightheadedness, dizziness, pain at the catheter insertion site. She is ready to go home  CURRENT MEDS . diltiazem  120 mg Oral Daily  . linagliptin  5 mg Oral Daily  . Rivaroxaban  20 mg Oral Q supper  . sodium chloride  3 mL Intravenous Q12H    OBJECTIVE  Filed Vitals:   05/23/13 0200 05/23/13 0300 05/23/13 0400 05/23/13 0722  BP: 118/60 124/68 135/75 139/77  Pulse: 90 90 90 90  Temp:  98.1 F (36.7 C)  98.2 F (36.8 C)  TempSrc:  Oral  Oral  Resp: 15 13 14 16   Height:      Weight:      SpO2: 96% 97% 96% 97%    Intake/Output Summary (Last 24 hours) at 05/23/13 0817 Last data filed at 05/23/13 0500  Gross per 24 hour  Intake    503 ml  Output    575 ml  Net    -72 ml   Filed Weights   05/22/13 1015 05/22/13 1717  Weight: 143 lb (64.864 kg) 143 lb 1.3 oz (64.9 kg)    PHYSICAL EXAM  General: Pleasant, NAD. Neuro: Alert and oriented X 3. Moves all extremities spontaneously. Psych: Normal affect. HEENT:  Normal  Neck: Supple without bruits or JVD. Lungs:  Resp regular and unlabored, CTA. Heart: RRR no s3, s4, or murmurs. Abdomen: Soft, non-tender, non-distended, BS + x 4.  Extremities: No clubbing, cyanosis or edema. DP/PT/Radials 2+ and equal bilaterally. R Groin site ok   Accessory Clinical Findings   TELE  HR 90- V-pacing  Radiology/Studies  No results found.  ASSESSMENT AND PLAN  Ms. Schlosser is a 78 year old woman with permanent atrial fibrillation. She has symptomatic bradycardia with syncope and documented pauses >5 seconds s/p PPM implant in 2013. Her V rates in atrial fibrillation have been inadequately controlled. Rate control efforts have been limited by hypotension and near-syncope. She  was admitted yesterday for an AV node ablation and is doing well.    PLAN: Discharge home    Signed, Ranae Plumber  Pager [phone]  Patient seen and examined with Lorretta Harp, PA-C. We discussed all aspects of the encounter. I agree with the assessment and plan as stated above.   She is doing well post AVN ablation. EP notes reviewed. V-pacing on tele. Groin site looks fine. Home today. F/u with EP.   Monifah Freehling,MD 9:44 AM

## 2013-05-23 NOTE — Progress Notes (Signed)
All D/C instructions given using teach back method. Pt verbalized and taught back all info. IV removed. Pt taken to pt pick up by nurse tech accompained by pts son.

## 2013-05-27 ENCOUNTER — Ambulatory Visit (HOSPITAL_COMMUNITY): Payer: Medicare Other | Attending: Internal Medicine | Admitting: Radiology

## 2013-05-27 ENCOUNTER — Encounter: Payer: Self-pay | Admitting: Cardiology

## 2013-05-27 DIAGNOSIS — Z950 Presence of cardiac pacemaker: Secondary | ICD-10-CM | POA: Insufficient documentation

## 2013-05-27 DIAGNOSIS — R0602 Shortness of breath: Secondary | ICD-10-CM | POA: Insufficient documentation

## 2013-05-27 DIAGNOSIS — R42 Dizziness and giddiness: Secondary | ICD-10-CM | POA: Insufficient documentation

## 2013-05-27 DIAGNOSIS — I495 Sick sinus syndrome: Secondary | ICD-10-CM | POA: Insufficient documentation

## 2013-05-27 DIAGNOSIS — I359 Nonrheumatic aortic valve disorder, unspecified: Secondary | ICD-10-CM | POA: Insufficient documentation

## 2013-05-27 DIAGNOSIS — I509 Heart failure, unspecified: Secondary | ICD-10-CM | POA: Insufficient documentation

## 2013-05-27 DIAGNOSIS — E785 Hyperlipidemia, unspecified: Secondary | ICD-10-CM | POA: Insufficient documentation

## 2013-05-27 DIAGNOSIS — I10 Essential (primary) hypertension: Secondary | ICD-10-CM | POA: Insufficient documentation

## 2013-05-27 DIAGNOSIS — J4489 Other specified chronic obstructive pulmonary disease: Secondary | ICD-10-CM | POA: Insufficient documentation

## 2013-05-27 DIAGNOSIS — Z87891 Personal history of nicotine dependence: Secondary | ICD-10-CM | POA: Insufficient documentation

## 2013-05-27 DIAGNOSIS — I4891 Unspecified atrial fibrillation: Secondary | ICD-10-CM | POA: Insufficient documentation

## 2013-05-27 DIAGNOSIS — I079 Rheumatic tricuspid valve disease, unspecified: Secondary | ICD-10-CM | POA: Insufficient documentation

## 2013-05-27 DIAGNOSIS — I059 Rheumatic mitral valve disease, unspecified: Secondary | ICD-10-CM | POA: Insufficient documentation

## 2013-05-27 DIAGNOSIS — J449 Chronic obstructive pulmonary disease, unspecified: Secondary | ICD-10-CM | POA: Insufficient documentation

## 2013-05-27 NOTE — Progress Notes (Signed)
Echocardiogram performed.  

## 2013-06-04 ENCOUNTER — Ambulatory Visit (INDEPENDENT_AMBULATORY_CARE_PROVIDER_SITE_OTHER): Payer: Medicare Other | Admitting: *Deleted

## 2013-06-04 DIAGNOSIS — I442 Atrioventricular block, complete: Secondary | ICD-10-CM

## 2013-06-04 LAB — MDC_IDC_ENUM_SESS_TYPE_INCLINIC
Battery Voltage: 2.78 V
Date Time Interrogation Session: 20150212103549
Lead Channel Impedance Value: 0 Ohm
Lead Channel Impedance Value: 646 Ohm
Lead Channel Pacing Threshold Amplitude: 0.5 V
Lead Channel Pacing Threshold Pulse Width: 0.4 ms
MDC IDC MSMT BATTERY IMPEDANCE: 229 Ohm
MDC IDC MSMT BATTERY REMAINING LONGEVITY: 94 mo
MDC IDC MSMT LEADCHNL RV SENSING INTR AMPL: 5.6 mV
MDC IDC SET LEADCHNL RV PACING AMPLITUDE: 2.5 V
MDC IDC SET LEADCHNL RV PACING PULSEWIDTH: 0.4 ms
MDC IDC SET LEADCHNL RV SENSING SENSITIVITY: 4 mV
MDC IDC STAT BRADY RV PERCENT PACED: 89 %

## 2013-06-04 NOTE — Progress Notes (Signed)
Pacemaker check in clinic. Normal device function. Thresholds, sensing, impedances consistent with previous measurements. Device programmed to maximize longevity.  Base rate to 80 and rate response on per ablation protocol.   No high ventricular rates noted. Device programmed at appropriate safety margins. Histogram distribution appropriate for patient activity level. Device programmed to optimize intrinsic conduction. Estimated longevity 8 years.  Patient education completed.  ROV 1 month with the device clinic for base rate reduction to 70.

## 2013-06-11 ENCOUNTER — Ambulatory Visit: Payer: Medicare Other | Admitting: Cardiology

## 2013-06-16 ENCOUNTER — Other Ambulatory Visit: Payer: Medicare Other

## 2013-06-18 ENCOUNTER — Encounter: Payer: Medicare Other | Admitting: Nurse Practitioner

## 2013-07-02 ENCOUNTER — Ambulatory Visit (INDEPENDENT_AMBULATORY_CARE_PROVIDER_SITE_OTHER): Payer: Medicare Other | Admitting: *Deleted

## 2013-07-02 DIAGNOSIS — I442 Atrioventricular block, complete: Secondary | ICD-10-CM

## 2013-07-02 LAB — MDC_IDC_ENUM_SESS_TYPE_INCLINIC
Battery Impedance: 277 Ohm
Battery Remaining Longevity: 91 mo
Battery Voltage: 2.78 V
Date Time Interrogation Session: 20150312105531
Lead Channel Pacing Threshold Amplitude: 0.375 V
Lead Channel Pacing Threshold Pulse Width: 0.4 ms
Lead Channel Sensing Intrinsic Amplitude: 4 mV
Lead Channel Setting Pacing Amplitude: 2.5 V
Lead Channel Setting Sensing Sensitivity: 4 mV
MDC IDC MSMT LEADCHNL RA IMPEDANCE VALUE: 0 Ohm
MDC IDC MSMT LEADCHNL RV IMPEDANCE VALUE: 657 Ohm
MDC IDC SET LEADCHNL RV PACING PULSEWIDTH: 0.4 ms
MDC IDC STAT BRADY RV PERCENT PACED: 91 %

## 2013-07-02 NOTE — Progress Notes (Signed)
Pacemaker check in clinic. Normal device function. Thresholds, sensing, impedances consistent with previous measurements. Device programmed to maximize longevity. No high ventricular rates noted. Device programmed at appropriate safety margins. ADL response reprogrammed 3->2 for slight right shift and patient c/o SOB on exertion.  Base rate to 70 today per protocol for ablation.   Device programmed to optimize intrinsic conduction. Estimated longevity 7.5 years. Patient enrolled in remote follow-up/TTM's with [employer]. Plan to follow every 3 months remotely and see annually in office. Patient education completed.  ROV in June with Dr. Caryl Comes.

## 2013-07-05 ENCOUNTER — Other Ambulatory Visit: Payer: Self-pay | Admitting: Nurse Practitioner

## 2013-07-07 ENCOUNTER — Encounter: Payer: Self-pay | Admitting: Internal Medicine

## 2013-07-09 ENCOUNTER — Telehealth: Payer: Self-pay | Admitting: *Deleted

## 2013-07-09 NOTE — Telephone Encounter (Signed)
Advised of echo

## 2013-07-09 NOTE — Telephone Encounter (Signed)
Message copied by Earvin Hansen on Thu Jul 09, 2013  9:58 AM ------      Message from: Darlin Coco      Created: Thu May 28, 2013  7:37 AM       Please report. Echo shows normal LV systolic function. No significant valve problems. No significant change in echo since previous study. ------

## 2013-07-23 ENCOUNTER — Encounter: Payer: Self-pay | Admitting: Nurse Practitioner

## 2013-07-23 ENCOUNTER — Ambulatory Visit (INDEPENDENT_AMBULATORY_CARE_PROVIDER_SITE_OTHER): Payer: Medicare Other | Admitting: Nurse Practitioner

## 2013-07-23 VITALS — BP 100/60 | HR 73 | Temp 97.9°F | Ht 65.0 in | Wt 146.2 lb

## 2013-07-23 DIAGNOSIS — F32A Depression, unspecified: Secondary | ICD-10-CM

## 2013-07-23 DIAGNOSIS — Z1231 Encounter for screening mammogram for malignant neoplasm of breast: Secondary | ICD-10-CM

## 2013-07-23 DIAGNOSIS — F3289 Other specified depressive episodes: Secondary | ICD-10-CM

## 2013-07-23 DIAGNOSIS — I10 Essential (primary) hypertension: Secondary | ICD-10-CM

## 2013-07-23 DIAGNOSIS — F329 Major depressive disorder, single episode, unspecified: Secondary | ICD-10-CM

## 2013-07-23 DIAGNOSIS — R42 Dizziness and giddiness: Secondary | ICD-10-CM

## 2013-07-23 DIAGNOSIS — E119 Type 2 diabetes mellitus without complications: Secondary | ICD-10-CM

## 2013-07-23 DIAGNOSIS — I4891 Unspecified atrial fibrillation: Secondary | ICD-10-CM

## 2013-07-23 MED ORDER — SERTRALINE HCL 50 MG PO TABS: ORAL_TABLET | ORAL | Status: DC

## 2013-07-23 NOTE — Progress Notes (Signed)
[name], female   DOB: 1935-08-01, 78 y.o.   MRN: [id number]    Allergies  Allergen Reactions  . Penicillins Hives and Rash  . Erythromycin Hives  . Neomycin-Bacitracin Zn-Polymyx Rash  . Sulfonamide Derivatives Hives and Rash  . Wool Alcohol [Lanolin Alcohol] Hives  . Nitrofurantoin Other (See Comments)    Unknown   . Nitrofurantoin Monohyd Macro Other (See Comments)    Unknown   . Sulfa Antibiotics Hives    Chief Complaint  Patient presents with  . Annual Exam    Physical with no labs prior, not fasting , fall screening done today  . Immunizations    ? shingles done in office ? when  . other    not going to church like used to, not getting dressed during the day or cleaning up her house.  other issues on paper   . other    pt's urologist is unable to do anything else for her urinary issues. questions regarding NPH (Normal Pressure Hydrocephalus)    HPI: Patient is a 78 y.o. female seen in the office today for extended visit; reports things are not going good, still having falls, urinary incontinence and urology has exhausted all options -question if she broke toes a month or so ago when she feel, foot swelled but better now, no pain, swelling or heat currently -reports she is not eating well; does not have appetite; staying at home, does not participate in activities, denies depression or anxiety but reports being with drawn and not motivated  -off all blood pressure medication per cardiology  Review of Systems:  Review of Systems  Constitutional: Positive for malaise/fatigue. Negative for fever and chills.  HENT: Negative for congestion, hearing loss, sore throat and tinnitus.   Eyes: Negative for blurred vision.  Respiratory: Negative for cough and shortness of breath.   Cardiovascular: Positive for palpitations. Negative for chest pain and leg swelling.  Gastrointestinal: Negative for heartburn, abdominal pain, diarrhea and constipation.  Genitourinary:  Positive for frequency (with incontience).  Musculoskeletal: Negative for joint pain and myalgias.  Skin: Negative for itching and rash.  Neurological: Positive for dizziness and weakness. Negative for tingling, sensory change, seizures and headaches.  Psychiatric/Behavioral: Positive for memory loss (per daughter). Negative for depression. The patient has insomnia. The patient is not nervous/anxious.      Past Medical History  Diagnosis Date  . Hypertension   . Osteoarthritis   . Hypercholesterolemia   . Paroxysmal atrial fibrillation     CHADS2: 2; managed with rate control and anticoagulation; s/p AVN ablation 04/2013  . Syncope and collapse     When patient stands up real quickly  . Normal cardiac stress test     a.  lexiscan MV - EF 72%, No ischemia  . Anemia     ? of lab error vs GI bleed  . COPD (chronic obstructive pulmonary disease)   . Chronic anticoagulation     On Xarelto  . Exertional dyspnea   . Type II diabetes mellitus   . History of blood transfusion     "wasn't needed; had some bad lab results" (March 07, 2012)  . Depression   . Prolonged grief reaction     "husband died 1 yr ago" (Mar 07, 2012)  . Urinary incontinence   . Benign paroxysmal positional vertigo 07/22/2012  . Tinnitus of left ear 07/22/2012  . Unspecified constipation   . Insomnia, unspecified   . Anxiety state, unspecified    Past Surgical History  Procedure  Laterality Date  . Appendectomy  1952  . Cervical disc surgery  04/2009  . Hip arthroplasty  11/08/2011    Procedure: ARTHROPLASTY BIPOLAR HIP;  Surgeon: Mauri Pole, MD;  Location: [facility];  Service: Orthopedics;  Laterality: Left;  . Insert / replace / remove pacemaker  02/29/2012  . Tubal ligation  1974  . Cholecystectomy  1984  . Polypectomy  11/21/1999    Endometrial polyps  . Eye surgery Right 03/2006    Cataract surgery  . Eye surgery  11/2004    Lid surgery  . Ablation  05/22/2013    AVN ablation by Dr Caryl Comes   Social History:    reports that she quit smoking about 27 years ago. Her smoking use included Cigarettes. She has a 70 pack-year smoking history. She has never used smokeless tobacco. She reports that she does not drink alcohol or use illicit drugs.  Family History  Problem Relation Age of Onset  . Arthritis Mother   . Kidney nephrosis Mother   . Ulcers Mother     Peptic  . Heart failure Mother     CHF  . Cancer Father     Kidney  . Early death Son     Self-inflicted gunshot    Medications: Patient's Medications  New Prescriptions   No medications on file  Previous Medications   CALCIUM PO    Take 1 tablet by mouth daily.   CHOLECALCIFEROL (VITAMIN D PO)    Take 2,000 Units by mouth daily.   FLAXSEED, LINSEED, (FLAX SEED OIL PO)    Take 1 capsule by mouth daily.    GLUCOSE BLOOD (BL TEST STRIP PACK) TEST STRIP    Accucheck blood sugar test strips. Check blood sugar twice daily DX:250.02   LANCETS THIN MISC    Use three times daily to check blood sugar daily to control diabetes. 250.00   LINAGLIPTIN (TRADJENTA) 5 MG TABS TABLET    Take 5 mg by mouth daily.   MULTIPLE VITAMIN (MULTIVITAMIN PO)    Take 1 tablet by mouth daily after breakfast.    MULTIPLE VITAMINS-MINERALS (HAIR/SKIN/NAILS) TABS    Take 3 tablets by mouth.   XARELTO 20 MG TABS TABLET    TAKE 1 TABLET DAILY  Modified Medications   No medications on file  Discontinued Medications   No medications on file     Physical Exam:  Filed Vitals:   07/23/13 1127  BP: 100/60  Pulse: 73  Temp: 97.9 F (36.6 C)  TempSrc: Oral  Height: 5\' 5"  (1.651 m)  Weight: 146 lb 3.2 oz (66.316 kg)  SpO2: 97%   Physical Exam  Constitutional: She is oriented to person, place, and time. No distress.  HENT:  Head: Normocephalic and atraumatic.  Right Ear: External ear normal.  Left Ear: External ear normal.  Nose: Nose normal.  Mouth/Throat: Oropharynx is clear and moist. No oropharyngeal exudate.  Eyes: Conjunctivae and EOM are normal. Pupils  are equal, round, and reactive to light.  Neck: Normal range of motion. Neck supple. No thyromegaly present.  Cardiovascular: Normal rate, regular rhythm and normal heart sounds.   Pulmonary/Chest: Effort normal and breath sounds normal. No respiratory distress. She has no wheezes.  Abdominal: Soft. Bowel sounds are normal. She exhibits no distension. There is no tenderness.  Musculoskeletal: She exhibits no edema and no tenderness.  Lymphadenopathy:    She has no cervical adenopathy.  Neurological: She is alert and oriented to person, place, and time.  Cautious gait,  walks with cane  Skin: Skin is warm and dry. She is not diaphoretic. No erythema.  Psychiatric: She exhibits a depressed mood. She has a flat affect.    Labs reviewed: Basic Metabolic Panel:  Recent Labs  10/30/12 0902 02/12/13 1539 05/15/13 1043  NA 141 145* 141  K 4.9 4.6 4.0  CL 101 104 105  CO2 25 23 27   GLUCOSE 146* 121* 181*  BUN 29* 26 13  CREATININE 1.22* 1.00 1.1  CALCIUM 10.0 9.5 9.6   Liver Function Tests:  Recent Labs  02/12/13 1539  AST 22  ALT 14  ALKPHOS 83  BILITOT 0.3  PROT 6.7   No results found for this basename: LIPASE, AMYLASE,  in the last 8760 hours No results found for this basename: AMMONIA,  in the last 8760 hours CBC:  Recent Labs  02/12/13 1539 05/15/13 1043  WBC 9.5 6.8  NEUTROABS 5.9 4.4  HGB 13.3 13.4  HCT 39.2 40.2  MCV 85 88.3  PLT 254 226.0   Assessment/Plan  1. Atrial fibrillation -conts to follow up with cardiology, pt reports she is off all rate controlling medications -has follow up with cardiology scheduled -conts on xarelto   2. HYPERTENSION -blood pressure low today, off all medications   3. DIABETES MELLITUS, TYPE II -taking tradjenta only, no reports of hypoglycemic episodes  - Comprehensive metabolic panel - Hemoglobin A1c  4. Depression -denies depression however score 14 on PHQ-9 screening tool which means pt is moderately depressed  and from HPI pt appears this way as well, daughter concerned due to lack of involvement and not leaving the house, will start back on medications at this time and follow up; pt aware of side effects and will call immediately  - CBC With differential/Platelet - TSH - sertraline (ZOLOFT) 50 MG tablet; 1/2 tablet daily for 2 weeks then increase to 1 tablet daily for mood  Dispense: 90 tablet; Refill: 0  5. Dizziness and giddiness -has been taking off all blood pressure medication per pt; pt went to see neurology and was supposed to follow up but never made appt; discussed follow up today and to call and get appt; pt willing to do this.  6. Other screening mammogram - MM DIGITAL SCREENING BILATERAL; Future

## 2013-07-23 NOTE — Patient Instructions (Signed)
Follow up in 4-6 weeks for medication change   To start Zoloft 1/2 tablet daily for 2 weeks then increase to 1 tablet  Please make follow up with Neurology

## 2013-07-23 NOTE — Progress Notes (Signed)
Failed clock drawing  

## 2013-07-24 LAB — CBC WITH DIFFERENTIAL
BASOS ABS: 0.1 10*3/uL (ref 0.0–0.2)
Basos: 1 %
EOS ABS: 0.1 10*3/uL (ref 0.0–0.4)
Eos: 1 %
HCT: 40.8 % (ref 34.0–46.6)
Hemoglobin: 13.3 g/dL (ref 11.1–15.9)
IMMATURE GRANS (ABS): 0 10*3/uL (ref 0.0–0.1)
Immature Granulocytes: 0 %
Lymphocytes Absolute: 1.5 10*3/uL (ref 0.7–3.1)
Lymphs: 17 %
MCH: 29 pg (ref 26.6–33.0)
MCHC: 32.6 g/dL (ref 31.5–35.7)
MCV: 89 fL (ref 79–97)
MONOCYTES: 8 %
Monocytes Absolute: 0.7 10*3/uL (ref 0.1–0.9)
NEUTROS PCT: 73 %
Neutrophils Absolute: 6.3 10*3/uL (ref 1.4–7.0)
Platelets: 248 10*3/uL (ref 150–379)
RBC: 4.59 x10E6/uL (ref 3.77–5.28)
RDW: 14.1 % (ref 12.3–15.4)
WBC: 8.7 10*3/uL (ref 3.4–10.8)

## 2013-07-24 LAB — COMPREHENSIVE METABOLIC PANEL
ALBUMIN: 4.1 g/dL (ref 3.5–4.8)
ALK PHOS: 95 IU/L (ref 39–117)
ALT: 14 IU/L (ref 0–32)
AST: 21 IU/L (ref 0–40)
Albumin/Globulin Ratio: 1.6 (ref 1.1–2.5)
BUN / CREAT RATIO: 22 (ref 11–26)
BUN: 22 mg/dL (ref 8–27)
CALCIUM: 9.6 mg/dL (ref 8.7–10.3)
CHLORIDE: 103 mmol/L (ref 97–108)
CO2: 22 mmol/L (ref 18–29)
Creatinine, Ser: 1 mg/dL (ref 0.57–1.00)
GFR calc Af Amer: 63 mL/min/{1.73_m2} (ref >59)
GFR calc non Af Amer: 55 mL/min/{1.73_m2} — ABNORMAL LOW (ref >59)
GLOBULIN, TOTAL: 2.6 g/dL (ref 1.5–4.5)
Glucose: 181 mg/dL — ABNORMAL HIGH (ref 65–99)
Potassium: 4.1 mmol/L (ref 3.5–5.2)
Sodium: 143 mmol/L (ref 134–144)
Total Bilirubin: 0.4 mg/dL (ref 0.0–1.2)
Total Protein: 6.7 g/dL (ref 6.0–8.5)

## 2013-07-24 LAB — HEMOGLOBIN A1C
Est. average glucose Bld gHb Est-mCnc: 154 mg/dL
HEMOGLOBIN A1C: 7 % — AB (ref 4.8–5.6)

## 2013-07-24 LAB — TSH: TSH: 2.07 u[IU]/mL (ref 0.450–4.500)

## 2013-07-24 NOTE — Progress Notes (Signed)
Quick Note:  Please make copy of labs for patient visit. ______ 

## 2013-07-27 ENCOUNTER — Encounter: Payer: Self-pay | Admitting: Cardiology

## 2013-07-27 ENCOUNTER — Ambulatory Visit (INDEPENDENT_AMBULATORY_CARE_PROVIDER_SITE_OTHER): Payer: Medicare Other | Admitting: Cardiology

## 2013-07-27 VITALS — BP 132/62 | HR 105 | Ht 65.0 in | Wt 143.0 lb

## 2013-07-27 DIAGNOSIS — I509 Heart failure, unspecified: Secondary | ICD-10-CM

## 2013-07-27 DIAGNOSIS — Z950 Presence of cardiac pacemaker: Secondary | ICD-10-CM

## 2013-07-27 DIAGNOSIS — I119 Hypertensive heart disease without heart failure: Secondary | ICD-10-CM

## 2013-07-27 DIAGNOSIS — I4891 Unspecified atrial fibrillation: Secondary | ICD-10-CM

## 2013-07-27 NOTE — Patient Instructions (Signed)
Your physician recommends that you continue on your current medications as directed. Please refer to the Current Medication list given to you today.  Your physician wants you to follow-up in: 6 months with Dr. Brackbill.  You will receive a reminder letter in the mail two months in advance. If you don't receive a letter, please call our office to schedule the follow-up appointment.  

## 2013-07-27 NOTE — Assessment & Plan Note (Signed)
The patient remains in chronic underlying atrial fibrillation.  She is on long-term Xarelto.  She has had some mild bruising.  She has not had any TIA symptoms.

## 2013-07-27 NOTE — Assessment & Plan Note (Signed)
Blood pressure is remaining normal on no blood pressure medication.

## 2013-07-27 NOTE — Progress Notes (Signed)
Erin Vang Date of Birth:  07-09-1935 [address] [place], [place]  [address] [phone]         Fax   [phone]  History of Present Illness: This pleasant 79 year old woman is seen for a scheduled followup office visit. She has a past history of tachybradycardia syndrome with chronic atrial fibrillation and is on Xarelto. She has a permanent pacemaker implanted by Dr. Caryl Comes.  She has a history of tachybradycardia syndrome.  We had great difficulty in preventing her from having very high heart rates.  On 05/22/13 she underwent AV node ablation by Dr. Caryl Comes.  Since then she has felt stronger. Her last echocardiogram on 05/27/13 showed an ejection fraction of 50-55% with mild aortic insufficiency and mild mitral regurgitation.  Current Outpatient Prescriptions  Medication Sig Dispense Refill  . Cholecalciferol (VITAMIN D PO) Take 2,000 Units by mouth daily.      . Flaxseed, Linseed, (FLAX SEED OIL PO) Take 1 capsule by mouth daily.       Marland Kitchen glucose blood (BL TEST STRIP PACK) test strip Accucheck blood sugar test strips. Check blood sugar twice daily DX:250.02  300 each  3  . Lancets Thin MISC Use three times daily to check blood sugar daily to control diabetes. 250.00  270 each  3  . linagliptin (TRADJENTA) 5 MG TABS tablet Take 5 mg by mouth daily.      . Multiple Vitamins-Minerals (HAIR/SKIN/NAILS) TABS Take 3 tablets by mouth.      . sertraline (ZOLOFT) 50 MG tablet 1/2 tablet daily for 2 weeks then increase to 1 tablet daily for mood  90 tablet  0  . XARELTO 20 MG TABS tablet TAKE 1 TABLET DAILY  90 tablet  0   No current facility-administered medications for this visit.    Allergies  Allergen Reactions  . Penicillins Hives and Rash  . Erythromycin Hives  . Neomycin-Bacitracin Zn-Polymyx Rash  . Sulfonamide Derivatives Hives and Rash  . Wool Alcohol [Lanolin Alcohol] Hives  . Nitrofurantoin Other (See Comments)    Unknown   . Nitrofurantoin Monohyd Macro  Other (See Comments)    Unknown   . Sulfa Antibiotics Hives    Patient Active Problem List   Diagnosis Date Noted  . Atrioventricular block, complete 05/22/2013  . Congestive heart failure, unspecified 05/05/2013  . Urge incontinence 02/12/2013  . Insomnia 08/13/2012  . Depression 08/04/2012  . Dizziness and giddiness 07/22/2012  . Tinnitus of left ear 07/22/2012  . Tachy-brady syndrome 03/01/2012  . Pacemaker-Medtronic 02/29/2012  . Closed left hip fracture 11/08/2011  . Reactive depression 05/25/2011  . Anorexia 05/25/2011  . Anemia 05/25/2011  . Weight loss, non-intentional 05/25/2011  . Constipation, chronic 05/25/2011  . Normal cardiac stress test   . Atrial fibrillation   . COPD (chronic obstructive pulmonary disease) 10/10/2010  . Benign hypertensive heart disease without heart failure 09/11/2010  . Osteoarthritis   . Hypercholesterolemia   . ALLERGIC RHINITIS 07/26/2009  . DIABETES MELLITUS, TYPE II 07/25/2009  . ANXIETY 07/25/2009  . HYPERTENSION 07/25/2009  . DYSPNEA 07/25/2009    History  Smoking status  . Former Smoker -- 2.00 packs/day for 35 years  . Types: Cigarettes  . Quit date: 04/23/1986  Smokeless tobacco  . Never Used    History  Alcohol Use No    Family History  Problem Relation Age of Onset  . Arthritis Mother   . Kidney nephrosis Mother   . Ulcers Mother  Peptic  . Heart failure Mother     CHF  . Cancer Father     Kidney  . Early death Son     Self-inflicted gunshot    Review of Systems: Constitutional: no fever chills diaphoresis or fatigue or change in weight.  Head and neck: no hearing loss, no epistaxis, no photophobia or visual disturbance. Respiratory: No cough, shortness of breath or wheezing. Cardiovascular: No chest pain peripheral edema, palpitations. Gastrointestinal: No abdominal distention, no abdominal pain, no change in bowel habits hematochezia or melena. Genitourinary: No dysuria, no frequency, no urgency,  no nocturia. Musculoskeletal:No arthralgias, no back pain, no gait disturbance or myalgias. Neurological: No dizziness, no headaches, no numbness, no seizures, no syncope, no weakness, no tremors. Hematologic: No lymphadenopathy, no easy bruising. Psychiatric: No confusion, no hallucinations, no sleep disturbance.    Physical Exam: Filed Vitals:   07/27/13 1443  BP: 132/62  Pulse: 105  The general appearance reveals a well-developed well-nourished elderly woman in no distress.The head and neck exam reveals pupils equal and reactive.  Extraocular movements are full.  There is no scleral icterus.  The mouth and pharynx are normal.  The neck is supple.  The carotids reveal no bruits.  The jugular venous pressure is normal.  The  thyroid is not enlarged.  There is no lymphadenopathy.  The chest is clear to percussion and auscultation.  There are no rales or rhonchi.  Expansion of the chest is symmetrical.  The pulse is regular with occasional premature beats. The precordium is quiet.  The first heart sound is normal.  The second heart sound is physiologically split.  There is no murmur gallop rub or click.  There is no abnormal lift or heave.  The abdomen is soft and nontender.  The bowel sounds are normal.  The liver and spleen are not enlarged.  There are no abdominal masses.  There are no abdominal bruits.  Extremities reveal good pedal pulses.  There is tenderness at the base of the right thumb and she states that she was told that she has a fracture from a previous fall.  There is no phlebitis or edema.  There is no cyanosis or clubbing.  Strength is normal and symmetrical in all extremities.  There is no lateralizing weakness.  There are no sensory deficits.  The skin is warm and dry.  There is no rash.     Assessment / Plan:  Overall the patient is doing much better since the AV node ablation.  We do not need any additional medication today.  Her last echocardiogram on 05/27/13 showed normal LV  function.  She will return in 6 months for followup office visit and EKG.

## 2013-07-27 NOTE — Assessment & Plan Note (Signed)
The patient is not having any signs or symptoms of congestive heart failure.  No peripheral edema or orthopnea.

## 2013-07-28 ENCOUNTER — Encounter: Payer: Self-pay | Admitting: Internal Medicine

## 2013-07-30 ENCOUNTER — Other Ambulatory Visit: Payer: Self-pay

## 2013-09-02 ENCOUNTER — Encounter: Payer: Self-pay | Admitting: Nurse Practitioner

## 2013-09-02 ENCOUNTER — Ambulatory Visit
Admission: RE | Admit: 2013-09-02 | Discharge: 2013-09-02 | Disposition: A | Discharge status: Home / Self Care | Payer: Medicare Other | Source: Ambulatory Visit | Attending: Nurse Practitioner | Admitting: Nurse Practitioner

## 2013-09-02 ENCOUNTER — Ambulatory Visit (INDEPENDENT_AMBULATORY_CARE_PROVIDER_SITE_OTHER): Payer: Medicare Other | Admitting: Nurse Practitioner

## 2013-09-02 VITALS — BP 128/76 | HR 97 | Temp 97.9°F | Resp 10 | Wt 146.0 lb

## 2013-09-02 DIAGNOSIS — R0602 Shortness of breath: Secondary | ICD-10-CM

## 2013-09-02 DIAGNOSIS — F32A Depression, unspecified: Secondary | ICD-10-CM

## 2013-09-02 DIAGNOSIS — R413 Other amnesia: Secondary | ICD-10-CM

## 2013-09-02 DIAGNOSIS — F329 Major depressive disorder, single episode, unspecified: Secondary | ICD-10-CM

## 2013-09-02 DIAGNOSIS — F3289 Other specified depressive episodes: Secondary | ICD-10-CM

## 2013-09-02 MED ORDER — CITALOPRAM HYDROBROMIDE 40 MG PO TABS: ORAL_TABLET | ORAL | Status: DC

## 2013-09-02 NOTE — Progress Notes (Signed)
Failed clock drawing  

## 2013-09-02 NOTE — Patient Instructions (Signed)
Stop zoloft since it is not working Prescription sent to pharmacy for celexa Start 1/2 tablet for 2 week then increase to 1 tablet   Follow up depression in 6 weeks

## 2013-09-02 NOTE — Progress Notes (Signed)
[name], female   DOB: 06-Aug-1935, 78 y.o.   MRN: [id number]    Allergies  Allergen Reactions  . Penicillins Hives and Rash  . Erythromycin Hives  . Neomycin-Bacitracin Zn-Polymyx Rash  . Sulfonamide Derivatives Hives and Rash  . Wool Alcohol [Lanolin Alcohol] Hives  . Nitrofurantoin Other (See Comments)    Unknown   . Nitrofurantoin Monohyd Macro Other (See Comments)    Unknown   . Sulfa Antibiotics Hives    Chief Complaint  Patient presents with  . Follow-up    6 week f/u on Zoloft, patient states " It's doing terrible, not helping all."    HPI: Patient is a 78 y.o. female seen in the office today for follow up on zoloft Daughter concerned because she is noticing worsening memory loss and behaviors. Was very rude to one of her doctors  Pt is buying nicotine gum and has not smoked in years, gets mad when her daughter talked to her about it. Not remembering days, showing up to appts at the wrong day and times MMSE last month was 26/30-- today it is- 24/30 Pt has appt scheduled for neurology  Reports zoloft is not helping at all, daughter noticing more irritability When she was on celexa she got better results.   Now wearing a boot after another fall, has a fracture Reports she is not dizzy currently  Reports worsening shortness of breath over the past several months-hx of smoking but has quit many years ago- smoked for 12-14 years  Review of Systems:  Review of Systems  Constitutional: Negative for malaise/fatigue.  Respiratory: Positive for shortness of breath (with increased activity). Negative for cough and sputum production.   Cardiovascular: Positive for palpitations (at times when she has increased activities). Negative for chest pain and leg swelling.  Gastrointestinal: Negative for diarrhea and constipation.  Genitourinary: Negative for dysuria, urgency and frequency.       Following with urology- reports he sent off urine which was fine    Musculoskeletal: Positive for falls.  Skin: Negative.   Neurological: Negative for dizziness and weakness.  Psychiatric/Behavioral: Positive for depression and memory loss. The patient is nervous/anxious.      Past Medical History  Diagnosis Date  . Hypertension   . Osteoarthritis   . Hypercholesterolemia   . Paroxysmal atrial fibrillation     CHADS2: 2; managed with rate control and anticoagulation; s/p AVN ablation 04/2013  . Syncope and collapse     When patient stands up real quickly  . Normal cardiac stress test     a.  lexiscan MV - EF 72%, No ischemia  . Anemia     ? of lab error vs GI bleed  . COPD (chronic obstructive pulmonary disease)   . Chronic anticoagulation     On Xarelto  . Exertional dyspnea   . Type II diabetes mellitus   . History of blood transfusion     "wasn't needed; had some bad lab results" (2012/03/02)  . Depression   . Prolonged grief reaction     "husband died 1 yr ago" (03-02-12)  . Urinary incontinence   . Benign paroxysmal positional vertigo 07/22/2012  . Tinnitus of left ear 07/22/2012  . Unspecified constipation   . Insomnia, unspecified   . Anxiety state, unspecified    Past Surgical History  Procedure Laterality Date  . Appendectomy  1952  . Cervical disc surgery  04/2009  . Hip arthroplasty  11/08/2011    Procedure: ARTHROPLASTY BIPOLAR HIP;  Surgeon: Mauri Pole, MD;  Location: [facility];  Service: Orthopedics;  Laterality: Left;  . Insert / replace / remove pacemaker  02/29/2012  . Tubal ligation  1974  . Cholecystectomy  1984  . Polypectomy  11/21/1999    Endometrial polyps  . Eye surgery Right 03/2006    Cataract surgery  . Eye surgery  11/2004    Lid surgery  . Ablation  05/22/2013    AVN ablation by Dr Caryl Comes   Social History:   reports that she quit smoking about 27 years ago. Her smoking use included Cigarettes. She has a 70 pack-year smoking history. She has never used smokeless tobacco. She reports that she does not drink  alcohol or use illicit drugs.  Family History  Problem Relation Age of Onset  . Arthritis Mother   . Kidney nephrosis Mother   . Ulcers Mother     Peptic  . Heart failure Mother     CHF  . Cancer Father     Kidney  . Early death Son     Self-inflicted gunshot    Medications: Patient's Medications  New Prescriptions   No medications on file  Previous Medications   CHOLECALCIFEROL (VITAMIN D PO)    Take 2,000 Units by mouth daily.   FLAXSEED, LINSEED, (FLAX SEED OIL PO)    Take 1 capsule by mouth daily.    GLUCOSE BLOOD (BL TEST STRIP PACK) TEST STRIP    Accucheck blood sugar test strips. Check blood sugar twice daily DX:250.02   LANCETS THIN MISC    Use three times daily to check blood sugar daily to control diabetes. 250.00   LINAGLIPTIN (TRADJENTA) 5 MG TABS TABLET    Take 5 mg by mouth daily.   MULTIPLE VITAMINS-MINERALS (HAIR/SKIN/NAILS) TABS    Take 3 tablets by mouth.   SERTRALINE (ZOLOFT) 50 MG TABLET    1/2 tablet daily for 2 weeks then increase to 1 tablet daily for mood   XARELTO 20 MG TABS TABLET    TAKE 1 TABLET DAILY  Modified Medications   No medications on file  Discontinued Medications   No medications on file     Physical Exam:  Filed Vitals:   09/02/13 1316  BP: 128/76  Pulse: 97  Temp: 97.9 F (36.6 C)  TempSrc: Oral  Resp: 10  Weight: 146 lb (66.225 kg)  SpO2: 97%    Physical Exam  Constitutional: She is oriented to person, place, and time. No distress.  HENT:  Head: Normocephalic and atraumatic.  Eyes: Conjunctivae and EOM are normal. Pupils are equal, round, and reactive to light.  Cardiovascular: Normal rate, regular rhythm and normal heart sounds.   Pulmonary/Chest: Effort normal and breath sounds normal. No respiratory distress. She has no wheezes. She has no rales. She exhibits no tenderness.  Abdominal: Soft. Bowel sounds are normal.  Musculoskeletal: She exhibits no edema and no tenderness.  Boot on left foot  Neurological: She is  alert and oriented to person, place, and time.  Cautious gait, walks with cane  Skin: Skin is warm and dry. She is not diaphoretic. No erythema.  Psychiatric: She exhibits a depressed mood. She has a flat affect.     Labs reviewed: Basic Metabolic Panel:  Recent Labs  02/12/13 1539 05/15/13 1043 07/23/13 1243  NA 145* 141 143  K 4.6 4.0 4.1  CL 104 105 103  CO2 23 27 22   GLUCOSE 121* 181* 181*  BUN 26 13 22   CREATININE 1.00 1.1  1.00  CALCIUM 9.5 9.6 9.6  TSH  --   --  2.070   Liver Function Tests:  Recent Labs  02/12/13 1539 07/23/13 1243  AST 22 21  ALT 14 14  ALKPHOS 83 95  BILITOT 0.3 0.4  PROT 6.7 6.7   No results found for this basename: LIPASE, AMYLASE,  in the last 8760 hours No results found for this basename: AMMONIA,  in the last 8760 hours CBC:  Recent Labs  02/12/13 1539 05/15/13 1043 07/23/13 1243  WBC 9.5 6.8 8.7  NEUTROABS 5.9 4.4 6.3  HGB 13.3 13.4 13.3  HCT 39.2 40.2 40.8  MCV 85 88.3 89  PLT 254 226.0 248   Lipid Panel: No results found for this basename: CHOL, HDL, LDLCALC, TRIG, CHOLHDL, LDLDIRECT,  in the last 8760 hours TSH:  Recent Labs  07/23/13 1243  TSH 2.070   A1C: Lab Results  Component Value Date   HGBA1C 7.0* 07/23/2013     Assessment/Plan 1. Depression -unchanged, zoloft not helping, had good effects on celexa but was hesitant to start this last time, now willing, will restart celexa - citalopram (CELEXA) 40 MG tablet; 1/2 tablet daily for 2 weeks then increase to 1 tablet daily for mood  Dispense: 90 tablet; Refill: 3  2. Memory loss -MMSe of 24/30 which is a decline from last month, pt not driving now -has scheduled appt with neurology   3. Shortness of breath -pt has had shortness of breath for over a year, had improved with pacemaker, however now in the most recent months it has became worse, hx of smoking, will check xray at this time DG Chest 2 View  Follow up in 6 weeks

## 2013-09-11 ENCOUNTER — Ambulatory Visit: Payer: Self-pay | Admitting: Neurology

## 2013-09-16 ENCOUNTER — Other Ambulatory Visit: Payer: Self-pay | Admitting: Nurse Practitioner

## 2013-09-23 ENCOUNTER — Encounter: Payer: Self-pay | Admitting: Nurse Practitioner

## 2013-10-05 ENCOUNTER — Encounter: Payer: Self-pay | Admitting: Internal Medicine

## 2013-10-05 ENCOUNTER — Ambulatory Visit (INDEPENDENT_AMBULATORY_CARE_PROVIDER_SITE_OTHER): Payer: Medicare Other | Admitting: Internal Medicine

## 2013-10-05 VITALS — BP 133/81 | HR 90 | Ht 65.5 in | Wt 143.0 lb

## 2013-10-05 DIAGNOSIS — I442 Atrioventricular block, complete: Secondary | ICD-10-CM

## 2013-10-05 DIAGNOSIS — I4891 Unspecified atrial fibrillation: Secondary | ICD-10-CM

## 2013-10-05 DIAGNOSIS — R0602 Shortness of breath: Secondary | ICD-10-CM

## 2013-10-05 DIAGNOSIS — Z950 Presence of cardiac pacemaker: Secondary | ICD-10-CM

## 2013-10-05 NOTE — Progress Notes (Signed)
Patient Care Team: Estill Dooms, MD as PCP - General (Internal Medicine) Darlin Coco, MD as Referring Physician (Cardiology)   HPI  Erin Vang is a 78 y.o. female Seen in followup for atrial fibrillation and permanent associated with syncope and greater than 5 second pauses prompting implantation of a pacemaker 11/13. She is on Rivaroxaban  Pharmacological efforts to control rate failed and were further complicated by dizziness and persistent exercise intolerance. In January she underwent AV junction ablation  When she saw Dr. Lisbeth Renshaw  he commented that she felt much stronger. She is not sure. Echocardiogram 2/15 demonstrated normal LV function  She's been complaining of exertional shortness of breath. A chest x-ray obtained 6/15 demonstrated no interval change and no active disease.  A. Myoview scan done in 2012 have been normal  Past Medical History  Diagnosis Date  . Hypertension   . Osteoarthritis   . Hypercholesterolemia   . Paroxysmal atrial fibrillation     CHADS2: 2; managed with rate control and anticoagulation; s/p AVN ablation 04/2013  . Syncope and collapse     When patient stands up real quickly  . Normal cardiac stress test     a.  lexiscan MV - EF 72%, No ischemia  . Anemia     ? of lab error vs GI bleed  . COPD (chronic obstructive pulmonary disease)   . Chronic anticoagulation     On Xarelto  . Exertional dyspnea   . Type II diabetes mellitus   . History of blood transfusion     "wasn't needed; had some bad lab results" (03-24-2012)  . Depression   . Prolonged grief reaction     "husband died 1 yr ago" (2012/03/24)  . Urinary incontinence   . Benign paroxysmal positional vertigo 07/22/2012  . Tinnitus of left ear 07/22/2012  . Unspecified constipation   . Insomnia, unspecified   . Anxiety state, unspecified     Past Surgical History  Procedure Laterality Date  . Appendectomy  1952  . Cervical disc surgery  04/2009  . Hip arthroplasty   11/08/2011    Procedure: ARTHROPLASTY BIPOLAR HIP;  Surgeon: Mauri Pole, MD;  Location: [facility];  Service: Orthopedics;  Laterality: Left;  . Insert / replace / remove pacemaker  03-24-12  . Tubal ligation  1974  . Cholecystectomy  1984  . Polypectomy  11/21/1999    Endometrial polyps  . Eye surgery Right 03/2006    Cataract surgery  . Eye surgery  11/2004    Lid surgery  . Ablation  05/22/2013    AVN ablation by Dr Caryl Comes    Current Outpatient Prescriptions  Medication Sig Dispense Refill  . Cholecalciferol (VITAMIN D PO) Take 2,000 Units by mouth daily.      . citalopram (CELEXA) 40 MG tablet 1/2 tablet daily for 2 weeks then increase to 1 tablet daily for mood  90 tablet  3  . Flaxseed, Linseed, (FLAX SEED OIL PO) Take 1 capsule by mouth daily.       Marland Kitchen glucose blood (BL TEST STRIP PACK) test strip Accucheck blood sugar test strips. Check blood sugar twice daily DX:250.02  300 each  3  . Lancets Thin MISC Use three times daily to check blood sugar daily to control diabetes. 250.00  270 each  3  . linagliptin (TRADJENTA) 5 MG TABS tablet Take 5 mg by mouth daily.      . Multiple Vitamins-Minerals (HAIR/SKIN/NAILS) TABS Take 3 tablets by  mouth.      . Omega-3 Fatty Acids (FISH OIL PO) Take by mouth daily.      Alveda Reasons 20 MG TABS tablet TAKE 1 TABLET DAILY  90 tablet  0   No current facility-administered medications for this visit.    Allergies  Allergen Reactions  . Penicillins Hives and Rash  . Erythromycin Hives  . Neomycin-Bacitracin Zn-Polymyx Rash  . Sulfonamide Derivatives Hives and Rash  . Wool Alcohol [Lanolin Alcohol] Hives  . Nitrofurantoin Other (See Comments)    Unknown   . Nitrofurantoin Monohyd Macro Other (See Comments)    Unknown   . Sulfa Antibiotics Hives    Review of Systems negative except from HPI and PMH  Physical Exam BP 133/81  Pulse 90  Ht 5' 5.5" (1.664 m)  Wt 143 lb (64.864 kg)  BMI 23.43 kg/m2  SpO2 99% Well developed and well nourished  in no acute distress HENT normal E scleral and icterus clear Neck Supple JVP flat; carotids brisk and full Clear to ausculation of breath sounds are enhanced on the left lung  Regular rate and rhythm, no murmurs gallops or rub Soft with active bowel sounds No clubbing cyanosis  Edema Alert and oriented, grossly normal motor and sensory function Skin Warm and Dry  ECG demonstrates atrial fibrillation with ventricular pacing and occasional PVCs the rate was 90 beats per minute  Assessment and  Plan  Dyspnea on exertion  Atrial fibrillation-permanent  Complete heart block S./P. AV junction ablation  Pacemaker-Medtronic  She has dyspnea on exertion which is not improved since her AV junction ablation. There is no accompanying chest discomfort. Chest x-ray evaluation was normal. We will check a BNP today looking for manifestations of heart failure  I will discuss with Dr. TB whether to pursue Myoview scanning.  Her heart rate response to exercise was overzealous and her rate response out rhythm was temperature. Unfortunately, a short exercise test in the office is not associated with any improvement

## 2013-10-05 NOTE — Patient Instructions (Signed)
Lab today: BNP  Your physician recommends that you continue on your current medications as directed. Please refer to the Current Medication list given to you today.  Remote monitoring is used to monitor your Pacemaker of ICD from home. This monitoring reduces the number of office visits required to check your device to one time per year. It allows [place] to keep an eye on the functioning of your device to ensure it is working properly. You are scheduled for a device check from home on 01/04/14. You may send your transmission at any time that day. If you have a wireless device, the transmission will be sent automatically. After your physician reviews your transmission, you will receive a postcard with your next transmission date.  Your physician wants you to follow-up in: 1 year with Dr. Caryl Comes.  You will receive a reminder letter in the mail two months in advance. If you don't receive a letter, please call our office to schedule the follow-up appointment.

## 2013-10-06 LAB — BRAIN NATRIURETIC PEPTIDE: BNP: 170.7 pg/mL — AB (ref 0.0–100.0)

## 2013-10-07 ENCOUNTER — Other Ambulatory Visit: Payer: Self-pay | Admitting: *Deleted

## 2013-10-07 MED ORDER — FUROSEMIDE 20 MG PO TABS: 20.0000 mg | ORAL_TABLET | Freq: Every day | ORAL | Status: DC

## 2013-10-09 ENCOUNTER — Telehealth: Payer: Self-pay | Admitting: Internal Medicine

## 2013-10-09 NOTE — Telephone Encounter (Signed)
New message     Patient is incontinent. She got a presc for lasix and now she is peeing everywhere.  Can she stop taking the lasix?  Also---pt want lab results

## 2013-10-09 NOTE — Telephone Encounter (Signed)
BUSY SIGNAL  X1   WILL TRY LATER./CY

## 2013-10-13 NOTE — Telephone Encounter (Signed)
Just take lasix PRN for severe dyspnea otherwise leave it off. Okay to give her her lab results.

## 2013-10-13 NOTE — Telephone Encounter (Signed)
Advised patient. She states her weight is down since starting Lasix. Advised patient to weigh herself daily every morning and to call if weight starts going up or weight gain of 2-3 pounds over night. If this should happen ok to take Lasix on that day. Scheduled follow up appointment for July.

## 2013-10-14 ENCOUNTER — Ambulatory Visit: Payer: Medicare Other | Admitting: Nurse Practitioner

## 2013-10-15 ENCOUNTER — Ambulatory Visit (INDEPENDENT_AMBULATORY_CARE_PROVIDER_SITE_OTHER): Payer: Medicare Other | Admitting: Nurse Practitioner

## 2013-10-15 ENCOUNTER — Encounter: Payer: Self-pay | Admitting: Nurse Practitioner

## 2013-10-15 VITALS — BP 134/76 | HR 87 | Temp 97.8°F | Resp 20 | Ht 65.5 in | Wt 146.0 lb

## 2013-10-15 DIAGNOSIS — M949 Disorder of cartilage, unspecified: Secondary | ICD-10-CM

## 2013-10-15 DIAGNOSIS — M899 Disorder of bone, unspecified: Secondary | ICD-10-CM

## 2013-10-15 DIAGNOSIS — R32 Unspecified urinary incontinence: Secondary | ICD-10-CM

## 2013-10-15 DIAGNOSIS — R2681 Unsteadiness on feet: Secondary | ICD-10-CM

## 2013-10-15 DIAGNOSIS — M8580 Other specified disorders of bone density and structure, unspecified site: Secondary | ICD-10-CM

## 2013-10-15 DIAGNOSIS — R269 Unspecified abnormalities of gait and mobility: Secondary | ICD-10-CM

## 2013-10-15 DIAGNOSIS — E119 Type 2 diabetes mellitus without complications: Secondary | ICD-10-CM

## 2013-10-15 NOTE — Progress Notes (Signed)
Patient ID: RUKIA MCGILLIVRAY, female   DOB: 28-Sep-1935, 78 y.o.   MRN: [id number]    Allergies  Allergen Reactions  . Penicillins Hives and Rash  . Erythromycin Hives  . Neomycin-Bacitracin Zn-Polymyx Rash  . Sulfonamide Derivatives Hives and Rash  . Wool Alcohol [Lanolin Alcohol] Hives  . Nitrofurantoin Other (See Comments)    Unknown   . Nitrofurantoin Monohyd Macro Other (See Comments)    Unknown   . Sulfa Antibiotics Hives    Chief Complaint  Patient presents with  . Acute Visit    unsteady since taking lasix    HPI: Patient is a 78 y.o. female seen in the office today for follow up depression and anxiety on celexa that was started at last visit. Daughter reports mood is much better, going out to lunch and being more sociable.  Today daughter reports when she was on the phone with her mother that she reported she was feeling drunk, unsteady, confused, could not find words.. Pt confirms this. Has been find since. Somewhat of an unsteady gait but this has not been new. Did not check blood sugar  Went to cardiologist on 6/15 found a problem with her pacemaker, reported HR was too high Having shortness of breath on exertion, BNP done which was 170 and was placed on lasix however she did not tolerate so she has stopped taking it.  Review of Systems:  Review of Systems  Constitutional: Negative for malaise/fatigue.  HENT: Negative for hearing loss and sore throat.   Respiratory: Positive for shortness of breath (with increased activity- has not changed, did not improve on lasix). Negative for cough and sputum production.   Cardiovascular: Positive for palpitations (at times when she has increased activities). Negative for chest pain and leg swelling.  Gastrointestinal: Negative for diarrhea and constipation.  Genitourinary: Negative for dysuria, urgency and frequency.  Musculoskeletal: Positive for falls.  Skin: Negative.   Neurological: Negative for dizziness, tingling, sensory  change, speech change, focal weakness, loss of consciousness, weakness and headaches.  Psychiatric/Behavioral: Positive for depression and memory loss. The patient is nervous/anxious.        Anxiety and depression has improved on celexa     Past Medical History  Diagnosis Date  . Hypertension   . Osteoarthritis   . Hypercholesterolemia   . Paroxysmal atrial fibrillation     CHADS2: 2; managed with rate control and anticoagulation; s/p AVN ablation 04/2013  . Syncope and collapse     When patient stands up real quickly  . Normal cardiac stress test     a.  lexiscan MV - EF 72%, No ischemia  . Anemia     ? of lab error vs GI bleed  . COPD (chronic obstructive pulmonary disease)   . Chronic anticoagulation     On Xarelto  . Exertional dyspnea   . Type II diabetes mellitus   . History of blood transfusion     "wasn't needed; had some bad lab results" (Mar 07, 2012)  . Depression   . Prolonged grief reaction     "husband died 1 yr ago" (03/07/12)  . Urinary incontinence   . Benign paroxysmal positional vertigo 07/22/2012  . Tinnitus of left ear 07/22/2012  . Unspecified constipation   . Insomnia, unspecified   . Anxiety state, unspecified    Past Surgical History  Procedure Laterality Date  . Appendectomy  1952  . Cervical disc surgery  04/2009  . Hip arthroplasty  11/08/2011    Procedure: ARTHROPLASTY BIPOLAR HIP;  Surgeon: Mauri Pole, MD;  Location: [facility];  Service: Orthopedics;  Laterality: Left;  . Insert / replace / remove pacemaker  02/29/2012  . Tubal ligation  1974  . Cholecystectomy  1984  . Polypectomy  11/21/1999    Endometrial polyps  . Eye surgery Right 03/2006    Cataract surgery  . Eye surgery  11/2004    Lid surgery  . Ablation  05/22/2013    AVN ablation by Dr Caryl Comes   Social History:   reports that she quit smoking about 27 years ago. Her smoking use included Cigarettes. She has a 70 pack-year smoking history. She has never used smokeless tobacco. She reports  that she does not drink alcohol or use illicit drugs.  Family History  Problem Relation Age of Onset  . Arthritis Mother   . Kidney nephrosis Mother   . Ulcers Mother     Peptic  . Heart failure Mother     CHF  . Cancer Father     Kidney  . Early death Son     Self-inflicted gunshot    Medications: Patient's Medications  New Prescriptions   No medications on file  Previous Medications   CHOLECALCIFEROL (VITAMIN D PO)    Take 2,000 Units by mouth daily.   CITALOPRAM (CELEXA) 40 MG TABLET    1/2 tablet daily for 2 weeks then increase to 1 tablet daily for mood   FLAXSEED, LINSEED, (FLAX SEED OIL PO)    Take 1 capsule by mouth daily.    FUROSEMIDE (LASIX) 20 MG TABLET    Take 1 tablet (20 mg total) by mouth daily.   GLUCOSE BLOOD (BL TEST STRIP PACK) TEST STRIP    Accucheck blood sugar test strips. Check blood sugar twice daily DX:250.02   LANCETS THIN MISC    Use three times daily to check blood sugar daily to control diabetes. 250.00   LINAGLIPTIN (TRADJENTA) 5 MG TABS TABLET    Take 5 mg by mouth daily.   MULTIPLE VITAMINS-MINERALS (HAIR/SKIN/NAILS) TABS    Take 3 tablets by mouth.   OMEGA-3 FATTY ACIDS (FISH OIL PO)    Take by mouth daily.   XARELTO 20 MG TABS TABLET    TAKE 1 TABLET DAILY  Modified Medications   No medications on file  Discontinued Medications   No medications on file     Physical Exam:  Filed Vitals:   10/15/13 1534  BP: 134/76  Pulse: 87  Temp: 97.8 F (36.6 C)  TempSrc: Oral  Resp: 20  Height: 5' 5.5" (1.664 m)  Weight: 146 lb (66.225 kg)  SpO2: 97%    Physical Exam  Constitutional: She is oriented to person, place, and time. No distress.  HENT:  Head: Normocephalic and atraumatic.  Eyes: Conjunctivae and EOM are normal. Pupils are equal, round, and reactive to light.  Cardiovascular: Normal rate, regular rhythm and normal heart sounds.   Pulmonary/Chest: Effort normal and breath sounds normal. No respiratory distress. She has no  wheezes. She has no rales. She exhibits no tenderness.  Abdominal: Soft. Bowel sounds are normal.  Musculoskeletal: She exhibits no edema and no tenderness.  Boot on left foot  Neurological: She is alert and oriented to person, place, and time. She has normal reflexes. No cranial nerve deficit. Coordination normal.  Cautious gait, walks with cane  Skin: Skin is warm and dry. She is not diaphoretic. No erythema.  Psychiatric: Affect normal.     Labs reviewed: Basic Metabolic Panel:  Recent  Labs  02/12/13 1539 05/15/13 1043 07/23/13 1243  NA 145* 141 143  K 4.6 4.0 4.1  CL 104 105 103  CO2 23 27 22   GLUCOSE 121* 181* 181*  BUN 26 13 22   CREATININE 1.00 1.1 1.00  CALCIUM 9.5 9.6 9.6  TSH  --   --  2.070   Liver Function Tests:  Recent Labs  02/12/13 1539 07/23/13 1243  AST 22 21  ALT 14 14  ALKPHOS 83 95  BILITOT 0.3 0.4  PROT 6.7 6.7   No results found for this basename: LIPASE, AMYLASE,  in the last 8760 hours No results found for this basename: AMMONIA,  in the last 8760 hours CBC:  Recent Labs  02/12/13 1539 05/15/13 1043 07/23/13 1243  WBC 9.5 6.8 8.7  NEUTROABS 5.9 4.4 6.3  HGB 13.3 13.4 13.3  HCT 39.2 40.2 40.8  MCV 85 88.3 89  PLT 254 226.0 248   Lipid Panel: No results found for this basename: CHOL, HDL, LDLCALC, TRIG, CHOLHDL, LDLDIRECT,  in the last 8760 hours TSH:  Recent Labs  07/23/13 1243  TSH 2.070   A1C: Lab Results  Component Value Date   HGBA1C 7.0* 07/23/2013     Assessment/Plan 1. Unsteady gait -worse than normal, with episode of worsening confusion today, will check labs.  -has pending follow up with neurology as well - Basic metabolic panel - Urinalysis - Culture, Urine - Vitamin D, 25-hydroxy  2. Incontinence -conts to be an issues but now with worsening gait and confusion will check - Urinalysis  and- Culture, Urine to rule out infections  3. Osteopenia -on xray, will check vit D level at this time and follow up  dexa scan she has not had one in years  - Vitamin D, 25-hydroxy - DG Bone Density; Future  4. DIABETES MELLITUS, TYPE II -blood sugars reviewed, fasting 100-160s, no hypoglycemic episodes that pt is aware of at this time - Microalbumin, urine  5. Depression Has improved significantly on celexa    Follow up in 3 months or sooner if needed

## 2013-10-16 LAB — URINALYSIS
BILIRUBIN UA: NEGATIVE
GLUCOSE, UA: NEGATIVE
Ketones, UA: NEGATIVE
Nitrite, UA: NEGATIVE
Protein, UA: NEGATIVE
RBC UA: NEGATIVE
Specific Gravity, UA: 1.017 (ref 1.005–1.030)
Urobilinogen, Ur: 0.2 mg/dL (ref 0.0–1.9)
pH, UA: 5.5 (ref 5.0–7.5)

## 2013-10-16 LAB — BASIC METABOLIC PANEL
BUN / CREAT RATIO: 31 — AB (ref 11–26)
BUN: 31 mg/dL — ABNORMAL HIGH (ref 8–27)
CALCIUM: 9.7 mg/dL (ref 8.7–10.3)
CHLORIDE: 103 mmol/L (ref 97–108)
CO2: 19 mmol/L (ref 18–29)
Creatinine, Ser: 1 mg/dL (ref 0.57–1.00)
GFR calc Af Amer: 63 mL/min/{1.73_m2} (ref >59)
GFR calc non Af Amer: 55 mL/min/{1.73_m2} — ABNORMAL LOW (ref >59)
Glucose: 146 mg/dL — ABNORMAL HIGH (ref 65–99)
Potassium: 4.6 mmol/L (ref 3.5–5.2)
SODIUM: 140 mmol/L (ref 134–144)

## 2013-10-16 LAB — VITAMIN D 25 HYDROXY (VIT D DEFICIENCY, FRACTURES): Vit D, 25-Hydroxy: 57.5 ng/mL (ref 30.0–100.0)

## 2013-10-16 LAB — MICROALBUMIN, URINE: Microalbumin, Urine: <3 ug/mL (ref 0.0–17.0)

## 2013-10-17 LAB — URINE CULTURE

## 2013-10-19 ENCOUNTER — Encounter: Payer: Self-pay | Admitting: *Deleted

## 2013-10-20 ENCOUNTER — Telehealth: Payer: Self-pay

## 2013-10-20 NOTE — Telephone Encounter (Signed)
Patient aware of lab results collected on 10/15/2013. No UTI, patient verbalized understanding of results

## 2013-10-29 ENCOUNTER — Telehealth: Payer: Self-pay | Admitting: Cardiology

## 2013-10-29 ENCOUNTER — Ambulatory Visit: Payer: Medicare Other | Admitting: Cardiology

## 2013-10-29 NOTE — Telephone Encounter (Signed)
Patient wanted to have  Dr. Mare Ferrari Rx muscle relaxer for back. Patient has appointment with PCP tomorrow. Per  Dr. Mare Ferrari recommend getting from PCP, advised patient

## 2013-10-29 NOTE — Telephone Encounter (Signed)
New message     Pt needs to talk to [place].  That is all I could get out of her.

## 2013-10-30 ENCOUNTER — Encounter: Payer: Self-pay | Admitting: Internal Medicine

## 2013-10-30 ENCOUNTER — Ambulatory Visit (INDEPENDENT_AMBULATORY_CARE_PROVIDER_SITE_OTHER): Payer: Medicare Other | Admitting: Internal Medicine

## 2013-10-30 VITALS — BP 144/86 | HR 85 | Temp 98.2°F | Resp 10 | Wt 147.0 lb

## 2013-10-30 DIAGNOSIS — R32 Unspecified urinary incontinence: Secondary | ICD-10-CM

## 2013-10-30 DIAGNOSIS — M62830 Muscle spasm of back: Secondary | ICD-10-CM | POA: Insufficient documentation

## 2013-10-30 DIAGNOSIS — R269 Unspecified abnormalities of gait and mobility: Secondary | ICD-10-CM

## 2013-10-30 DIAGNOSIS — M5380 Other specified dorsopathies, site unspecified: Secondary | ICD-10-CM

## 2013-10-30 DIAGNOSIS — R2681 Unsteadiness on feet: Secondary | ICD-10-CM

## 2013-10-30 NOTE — Progress Notes (Signed)
[name], female   DOB: [birth year], 78 y.o.   MRN: [id number]   Location:  [facility]   Allergies  Allergen Reactions  . Penicillins Hives and Rash  . Erythromycin Hives  . Neomycin-Bacitracin Zn-Polymyx Rash  . Sulfonamide Derivatives Hives and Rash  . Wool Alcohol [Lanolin Alcohol] Hives  . Nitrofurantoin Other (See Comments)    Unknown   . Nitrofurantoin Monohyd Macro Other (See Comments)    Unknown   . Sulfa Antibiotics Hives    Chief Complaint  Patient presents with  . Back Pain    Right side/ lower back pain x 3 days- ? pulled muscle     HPI: Patient is a 78 y.o. white female seen in the office today for acute visit for low back pain.   Was in bed asleep and turned over Then right side of lower back started to hurt Keeps grabbing her No improvement except some help with cold and hot medicated patches Has been using ibuprofen.  Kidneys are pretty normal Says she has a pacemaker so can't take anything else for pain. Moving makes it worse Does not want muscle relaxants due to fall risk.  Gets up a lot at night due to incontinence.   Had fall a long time ago now, but still has bruises over right lower posterior ribs  Review of Systems:  Review of Systems  Constitutional: Negative for fever.  HENT: Negative for congestion.   Eyes: Negative for blurred vision.  Respiratory: Negative for shortness of breath.   Cardiovascular: Negative for chest pain.  Gastrointestinal: Negative for abdominal pain, diarrhea, constipation, blood in stool and melena.  Genitourinary: Positive for urgency.       Urinary incontinence  Musculoskeletal: Positive for back pain, falls and myalgias. Negative for joint pain.  Neurological: Positive for dizziness. Negative for loss of consciousness, weakness and headaches.  Endo/Heme/Allergies: Bruises/bleeds easily.  Psychiatric/Behavioral: Positive for depression and memory loss.      Past Medical History  Diagnosis Date  . Hypertension   . Osteoarthritis   . Hypercholesterolemia   . Paroxysmal atrial fibrillation     CHADS2: 2; managed with rate control and anticoagulation; s/p AVN ablation 04/2013  . Syncope and collapse     When patient stands up real quickly  . Normal cardiac stress test     a.  lexiscan MV - EF 72%, No ischemia  . Anemia     ? of lab error vs GI bleed  . COPD (chronic obstructive pulmonary disease)   . Chronic anticoagulation     On Xarelto  . Exertional dyspnea   . Type II diabetes mellitus   . History of blood transfusion     "wasn't needed; had some bad lab results" (03-11-2012)  . Depression   . Prolonged grief reaction     "husband died 1 yr ago" (03-11-2012)  . Urinary incontinence   . Benign paroxysmal positional vertigo 07/22/2012  . Tinnitus of left ear 07/22/2012  . Unspecified constipation   . Insomnia, unspecified   . Anxiety state, unspecified     Past Surgical History  Procedure Laterality Date  . Appendectomy  1952  . Cervical disc surgery  04/2009  . Hip arthroplasty  11/08/2011    Procedure: ARTHROPLASTY BIPOLAR HIP;  Surgeon: Mauri Pole, MD;  Location: [facility];  Service: Orthopedics;  Laterality: Left;  . Insert / replace / remove pacemaker  March 11, 2012  . Tubal ligation  1974  . Cholecystectomy  1984  . Polypectomy  11/21/1999    Endometrial polyps  . Eye surgery Right 03/2006    Cataract surgery  . Eye surgery  11/2004    Lid surgery  . Ablation  05/22/2013    AVN ablation by Dr Caryl Comes    Social History:   reports that she quit smoking about 27 years ago. Her smoking use included Cigarettes. She has a 70 pack-year smoking history. She has never used smokeless tobacco. She reports that she does not drink alcohol or use illicit drugs.  Family History  Problem Relation Age of Onset  . Arthritis Mother   . Kidney nephrosis Mother   . Ulcers Mother     Peptic  . Heart failure Mother     CHF  . Cancer  Father     Kidney  . Early death Son     Self-inflicted gunshot    Medications: Patient's Medications  New Prescriptions   No medications on file  Previous Medications   CHOLECALCIFEROL (VITAMIN D PO)    Take 2,000 Units by mouth daily.   FLAXSEED, LINSEED, (FLAX SEED OIL PO)    Take 1 capsule by mouth daily. 1400 mg   GLUCOSE BLOOD (BL TEST STRIP PACK) TEST STRIP    Accucheck blood sugar test strips. Check blood sugar twice daily DX:250.02   LANCETS THIN MISC    Use three times daily to check blood sugar daily to control diabetes. 250.00   LINAGLIPTIN (TRADJENTA) 5 MG TABS TABLET    Take 5 mg by mouth daily.   MULTIPLE VITAMINS-MINERALS (CENTRUM SILVER PO)    Take by mouth. 1 by mouth daily   MULTIPLE VITAMINS-MINERALS (HAIR/SKIN/NAILS) TABS    Take 2 tablets by mouth.    OMEGA-3 FATTY ACIDS (FISH OIL PO)    Take by mouth daily. 1220 mg/360 Omega 3   XARELTO 20 MG TABS TABLET    TAKE 1 TABLET DAILY  Modified Medications   Modified Medication Previous Medication   CITALOPRAM (CELEXA) 40 MG TABLET citalopram (CELEXA) 40 MG tablet      1 tablet daily for mood    1/2 tablet daily for 2 weeks then increase to 1 tablet daily for mood  Discontinued Medications   No medications on file     Physical Exam: Filed Vitals:   10/30/13 1152  BP: 144/86  Pulse: 85  Temp: 98.2 F (36.8 C)  TempSrc: Oral  Resp: 10  Weight: 147 lb (66.679 kg)  SpO2: 95%  Physical Exam   Labs reviewed: Basic Metabolic Panel:  Recent Labs  05/15/13 1043 07/23/13 1243 10/15/13 1657  NA 141 143 140  K 4.0 4.1 4.6  CL 105 103 103  CO2 27 22 19   GLUCOSE 181* 181* 146*  BUN 13 22 31*  CREATININE 1.1 1.00 1.00  CALCIUM 9.6 9.6 9.7  TSH  --  2.070  --    Liver Function Tests:  Recent Labs  02/12/13 1539 07/23/13 1243  AST 22 21  ALT 14 14  ALKPHOS 83 95  BILITOT 0.3 0.4  PROT 6.7 6.7   No results found for this basename: LIPASE, AMYLASE,  in the last 8760 hours No results found for this  basename: AMMONIA,  in the last 8760 hours CBC:  Recent Labs  02/12/13 1539 05/15/13 1043 07/23/13 1243  WBC 9.5 6.8 8.7  NEUTROABS 5.9 4.4 6.3  HGB 13.3 13.4 13.3  HCT 39.2 40.2 40.8  MCV 85 88.3 89  PLT 254 226.0 248   Lipid Panel: No results found for this basename: CHOL, HDL, LDLCALC, TRIG, CHOLHDL, LDLDIRECT,  in the last 8760 hours Lab Results  Component Value Date   HGBA1C 7.0* 07/23/2013   Assessment/Plan 1. Muscle spasm of back -pt with acute onset of low back pain in right lower back -pt and her daughter do not want her on muscle relaxants due to recommendations against these in her age group due to falls and confusion so will avoid -recommended continued use of heat vs. Heat patches, may also alternate with ice or use topical warming creams (not with the heating pad) - also referred for home health therapy to teach her stretches for her back  -Ambulatory referral to [facility] -return in 2 wks to see Janett Billow if not getting better in which case imaging may be needed  2. Unsteady gait - has been problematic--last fall quite a while ago, but still has bruises on right side of lower ribs near site of muscle spasm - Ambulatory referral to [facility]  3. Incontinence -gets up all night to urinate increasing her fall risk so avoid muscle relaxants  Labs/tests ordered:   Orders Placed This Encounter  Procedures  . Ambulatory referral to Home Health    Referral Priority:  Routine    Referral Type:  Home Health Care    Referral Reason:  Specialty Services Required    Requested Specialty:  [place]    Number of Visits Requested:  1    Next appt:  2 wks

## 2013-10-30 NOTE — Patient Instructions (Signed)

## 2013-11-02 ENCOUNTER — Telehealth: Payer: Self-pay | Admitting: *Deleted

## 2013-11-02 NOTE — Telephone Encounter (Signed)
Patient called and stated that her back spasms are worse and can't lay down or lay on side. Wants some muscle relaxers to take at night so she can sleep. Please Advise.

## 2013-11-03 MED ORDER — METAXALONE 800 MG PO TABS: ORAL_TABLET | ORAL | Status: DC

## 2013-11-03 NOTE — Telephone Encounter (Signed)
Ok.  I know her daughter is aware of the potential fall risks from these medicines, but I agree that she would benefit from something at bedtime.  Skelaxin 800mg  po qhs prn muscle spasms.  #14, no refills.  I also recommend she wear a heat patch on the area to bed at night.

## 2013-11-03 NOTE — Telephone Encounter (Signed)
Patient Notified and faxed Rx to pharmacy. 

## 2013-11-04 DIAGNOSIS — I4891 Unspecified atrial fibrillation: Secondary | ICD-10-CM

## 2013-11-04 DIAGNOSIS — M5380 Other specified dorsopathies, site unspecified: Secondary | ICD-10-CM

## 2013-11-04 DIAGNOSIS — Z9181 History of falling: Secondary | ICD-10-CM

## 2013-11-04 DIAGNOSIS — E119 Type 2 diabetes mellitus without complications: Secondary | ICD-10-CM

## 2013-11-12 ENCOUNTER — Ambulatory Visit (INDEPENDENT_AMBULATORY_CARE_PROVIDER_SITE_OTHER): Payer: Medicare Other | Admitting: Nurse Practitioner

## 2013-11-12 ENCOUNTER — Emergency Department (HOSPITAL_COMMUNITY)
Admission: EM | Admit: 2013-11-12 | Discharge: 2013-11-12 | Disposition: A | Discharge status: Home / Self Care | Payer: Medicare Other | Attending: Emergency Medicine | Admitting: Emergency Medicine

## 2013-11-12 ENCOUNTER — Emergency Department (HOSPITAL_COMMUNITY): Payer: Medicare Other

## 2013-11-12 ENCOUNTER — Encounter (HOSPITAL_COMMUNITY): Payer: Self-pay | Admitting: Emergency Medicine

## 2013-11-12 ENCOUNTER — Encounter: Payer: Self-pay | Admitting: Nurse Practitioner

## 2013-11-12 VITALS — HR 94 | Temp 98.2°F | Resp 20 | Ht 65.5 in | Wt 144.4 lb

## 2013-11-12 DIAGNOSIS — F329 Major depressive disorder, single episode, unspecified: Secondary | ICD-10-CM | POA: Diagnosis not present

## 2013-11-12 DIAGNOSIS — F411 Generalized anxiety disorder: Secondary | ICD-10-CM | POA: Insufficient documentation

## 2013-11-12 DIAGNOSIS — Y9289 Other specified places as the place of occurrence of the external cause: Secondary | ICD-10-CM | POA: Insufficient documentation

## 2013-11-12 DIAGNOSIS — E7800 Pure hypercholesterolemia, unspecified: Secondary | ICD-10-CM | POA: Diagnosis not present

## 2013-11-12 DIAGNOSIS — F3289 Other specified depressive episodes: Secondary | ICD-10-CM

## 2013-11-12 DIAGNOSIS — W1809XA Striking against other object with subsequent fall, initial encounter: Secondary | ICD-10-CM | POA: Insufficient documentation

## 2013-11-12 DIAGNOSIS — S0990XA Unspecified injury of head, initial encounter: Secondary | ICD-10-CM | POA: Insufficient documentation

## 2013-11-12 DIAGNOSIS — Z7901 Long term (current) use of anticoagulants: Secondary | ICD-10-CM | POA: Insufficient documentation

## 2013-11-12 DIAGNOSIS — H9319 Tinnitus, unspecified ear: Secondary | ICD-10-CM | POA: Diagnosis not present

## 2013-11-12 DIAGNOSIS — I4891 Unspecified atrial fibrillation: Secondary | ICD-10-CM | POA: Insufficient documentation

## 2013-11-12 DIAGNOSIS — I10 Essential (primary) hypertension: Secondary | ICD-10-CM | POA: Diagnosis not present

## 2013-11-12 DIAGNOSIS — Z9089 Acquired absence of other organs: Secondary | ICD-10-CM | POA: Insufficient documentation

## 2013-11-12 DIAGNOSIS — Z87891 Personal history of nicotine dependence: Secondary | ICD-10-CM | POA: Insufficient documentation

## 2013-11-12 DIAGNOSIS — W010XXA Fall on same level from slipping, tripping and stumbling without subsequent striking against object, initial encounter: Secondary | ICD-10-CM | POA: Diagnosis not present

## 2013-11-12 DIAGNOSIS — R0602 Shortness of breath: Secondary | ICD-10-CM

## 2013-11-12 DIAGNOSIS — J449 Chronic obstructive pulmonary disease, unspecified: Secondary | ICD-10-CM | POA: Insufficient documentation

## 2013-11-12 DIAGNOSIS — Z880 Allergy status to penicillin: Secondary | ICD-10-CM | POA: Diagnosis not present

## 2013-11-12 DIAGNOSIS — J4489 Other specified chronic obstructive pulmonary disease: Secondary | ICD-10-CM | POA: Insufficient documentation

## 2013-11-12 DIAGNOSIS — D649 Anemia, unspecified: Secondary | ICD-10-CM | POA: Insufficient documentation

## 2013-11-12 DIAGNOSIS — F32A Depression, unspecified: Secondary | ICD-10-CM

## 2013-11-12 DIAGNOSIS — Z87448 Personal history of other diseases of urinary system: Secondary | ICD-10-CM | POA: Diagnosis not present

## 2013-11-12 DIAGNOSIS — Z79899 Other long term (current) drug therapy: Secondary | ICD-10-CM | POA: Insufficient documentation

## 2013-11-12 DIAGNOSIS — Y9389 Activity, other specified: Secondary | ICD-10-CM | POA: Diagnosis not present

## 2013-11-12 DIAGNOSIS — Z95810 Presence of automatic (implantable) cardiac defibrillator: Secondary | ICD-10-CM | POA: Insufficient documentation

## 2013-11-12 DIAGNOSIS — E119 Type 2 diabetes mellitus without complications: Secondary | ICD-10-CM | POA: Diagnosis not present

## 2013-11-12 DIAGNOSIS — M62830 Muscle spasm of back: Secondary | ICD-10-CM

## 2013-11-12 DIAGNOSIS — S0510XA Contusion of eyeball and orbital tissues, unspecified eye, initial encounter: Secondary | ICD-10-CM | POA: Insufficient documentation

## 2013-11-12 DIAGNOSIS — M1990 Unspecified osteoarthritis, unspecified site: Secondary | ICD-10-CM | POA: Insufficient documentation

## 2013-11-12 DIAGNOSIS — M5380 Other specified dorsopathies, site unspecified: Secondary | ICD-10-CM

## 2013-11-12 LAB — BASIC METABOLIC PANEL
ANION GAP: 17 — AB (ref 5–15)
BUN: 21 mg/dL (ref 6–23)
CHLORIDE: 105 meq/L (ref 96–112)
CO2: 21 meq/L (ref 19–32)
CREATININE: 0.84 mg/dL (ref 0.50–1.10)
Calcium: 9.6 mg/dL (ref 8.4–10.5)
GFR calc Af Amer: 76 mL/min — ABNORMAL LOW (ref >90)
GFR calc non Af Amer: 66 mL/min — ABNORMAL LOW (ref >90)
GLUCOSE: 104 mg/dL — AB (ref 70–99)
Potassium: 4.4 mEq/L (ref 3.7–5.3)
Sodium: 143 mEq/L (ref 137–147)

## 2013-11-12 LAB — CBC
HEMATOCRIT: 40.8 % (ref 36.0–46.0)
Hemoglobin: 13.6 g/dL (ref 12.0–15.0)
MCH: 29.8 pg (ref 26.0–34.0)
MCHC: 33.3 g/dL (ref 30.0–36.0)
MCV: 89.3 fL (ref 78.0–100.0)
PLATELETS: 291 10*3/uL (ref 150–400)
RBC: 4.57 MIL/uL (ref 3.87–5.11)
RDW: 14.4 % (ref 11.5–15.5)
WBC: 12 10*3/uL — ABNORMAL HIGH (ref 4.0–10.5)

## 2013-11-12 LAB — APTT: aPTT: 34 seconds (ref 24–37)

## 2013-11-12 LAB — I-STAT TROPONIN, ED: TROPONIN I, POC: 0.01 ng/mL (ref 0.00–0.08)

## 2013-11-12 LAB — PROTIME-INR
INR: 1.7 — ABNORMAL HIGH (ref 0.00–1.49)
Prothrombin Time: 20 seconds — ABNORMAL HIGH (ref 11.6–15.2)

## 2013-11-12 MED ORDER — MORPHINE SULFATE 4 MG/ML IJ SOLN: 4.0000 mg | INTRAMUSCULAR | Status: DC | PRN

## 2013-11-12 MED ORDER — ONDANSETRON 4 MG PO TBDP: 4.0000 mg | ORAL_TABLET | Freq: Once | ORAL | Status: DC

## 2013-11-12 MED ORDER — VORTIOXETINE HBR 10 MG PO TABS: 10.0000 mg | ORAL_TABLET | Freq: Every day | ORAL | Status: DC

## 2013-11-12 NOTE — Progress Notes (Signed)
[name], female   DOB: 1935-08-29, 78 y.o.   MRN: [id number]    Allergies  Allergen Reactions  . Penicillins Hives and Rash  . Erythromycin Hives  . Neomycin-Bacitracin Zn-Polymyx Rash  . Sulfonamide Derivatives Hives and Rash  . Wool Alcohol [Lanolin Alcohol] Hives  . Nitrofurantoin Other (See Comments)    Unknown   . Nitrofurantoin Monohyd Macro Other (See Comments)    Unknown   . Sulfa Antibiotics Hives    Chief Complaint  Patient presents with  . Follow-up    HPI: Patient is a 78 y.o. female seen in the office today for follow up on muscle spasm of back. Doing much better with back pain with muscle relaxer (took only once) and tylenol and therapy. Now having issues with shortness of breath but reports this is NO worse than what it is normally. therapy is going to be working with her to help conditioning due to lack of activity.  depression is worse, so HI or SI but feels like she is getting no benefit from celexa (daughter questions if she is even taking it but pt reports she is) Review of Systems:  Review of Systems  Constitutional: Negative for malaise/fatigue.  HENT: Negative for hearing loss and sore throat.   Respiratory: Positive for shortness of breath (with increased activity- has not changed). Negative for cough and sputum production.   Cardiovascular: Positive for palpitations (at times when she has increased activities). Negative for chest pain and leg swelling.  Gastrointestinal: Negative for diarrhea and constipation.  Genitourinary: Negative for dysuria, urgency and frequency.  Musculoskeletal: Positive for falls (no more falls using walker in house). Negative for back pain, joint pain and myalgias.  Skin: Negative.   Neurological: Negative for dizziness, tingling, sensory change, speech change, focal weakness, loss of consciousness, weakness and headaches.  Psychiatric/Behavioral: Positive for depression and memory loss. Negative for suicidal  ideas. The patient is nervous/anxious.        Anxiety and depression is now worse, with increase irritability, just not happy      Past Medical History  Diagnosis Date  . Hypertension   . Osteoarthritis   . Hypercholesterolemia   . Paroxysmal atrial fibrillation     CHADS2: 2; managed with rate control and anticoagulation; s/p AVN ablation 04/2013  . Syncope and collapse     When patient stands up real quickly  . Normal cardiac stress test     a.  lexiscan MV - EF 72%, No ischemia  . Anemia     ? of lab error vs GI bleed  . COPD (chronic obstructive pulmonary disease)   . Chronic anticoagulation     On Xarelto  . Exertional dyspnea   . Type II diabetes mellitus   . History of blood transfusion     "wasn't needed; had some bad lab results" (2012/03/18)  . Depression   . Prolonged grief reaction     "husband died 1 yr ago" (18-Mar-2012)  . Urinary incontinence   . Benign paroxysmal positional vertigo 07/22/2012  . Tinnitus of left ear 07/22/2012  . Unspecified constipation   . Insomnia, unspecified   . Anxiety state, unspecified    Past Surgical History  Procedure Laterality Date  . Appendectomy  1952  . Cervical disc surgery  04/2009  . Hip arthroplasty  11/08/2011    Procedure: ARTHROPLASTY BIPOLAR HIP;  Surgeon: Mauri Pole, MD;  Location: [place];  Service: Orthopedics;  Laterality: Left;  . Insert / replace /  remove pacemaker  02/29/2012  . Tubal ligation  1974  . Cholecystectomy  1984  . Polypectomy  11/21/1999    Endometrial polyps  . Eye surgery Right 03/2006    Cataract surgery  . Eye surgery  11/2004    Lid surgery  . Ablation  05/22/2013    AVN ablation by Dr Caryl Comes   Social History:   reports that she quit smoking about 27 years ago. Her smoking use included Cigarettes. She has a 70 pack-year smoking history. She has never used smokeless tobacco. She reports that she does not drink alcohol or use illicit drugs.  Family History  Problem Relation Age of Onset  .  Arthritis Mother   . Kidney nephrosis Mother   . Ulcers Mother     Peptic  . Heart failure Mother     CHF  . Cancer Father     Kidney  . Early death Son     Self-inflicted gunshot    Medications: Patient's Medications  New Prescriptions   No medications on file  Previous Medications   CHOLECALCIFEROL (VITAMIN D PO)    Take 2,000 Units by mouth daily.   CITALOPRAM (CELEXA) 40 MG TABLET    1 tablet daily for mood   FLAXSEED, LINSEED, (FLAX SEED OIL PO)    Take 1 capsule by mouth daily. 1400 mg   GLUCOSE BLOOD (BL TEST STRIP PACK) TEST STRIP    Accucheck blood sugar test strips. Check blood sugar twice daily DX:250.02   LANCETS THIN MISC    Use three times daily to check blood sugar daily to control diabetes. 250.00   LINAGLIPTIN (TRADJENTA) 5 MG TABS TABLET    Take 5 mg by mouth daily.   MULTIPLE VITAMINS-MINERALS (CENTRUM SILVER PO)    Take by mouth. 1 by mouth daily   MULTIPLE VITAMINS-MINERALS (HAIR/SKIN/NAILS) TABS    Take 2 tablets by mouth.    OMEGA-3 FATTY ACIDS (FISH OIL PO)    Take by mouth daily. 1220 mg/360 Omega 3   XARELTO 20 MG TABS TABLET    TAKE 1 TABLET DAILY  Modified Medications   No medications on file  Discontinued Medications   METAXALONE (SKELAXIN) 800 MG TABLET    Take one tablet by mouth at bedtime as needed for muscle spasms     Physical Exam:  Filed Vitals:   11/12/13 1626  Pulse: 94  Temp: 98.2 F (36.8 C)  TempSrc: Oral  Resp: 20  Height: 5' 5.5" (1.664 m)  Weight: 144 lb 6.4 oz (65.499 kg)  SpO2: 97%    Physical Exam  Constitutional: She is oriented to person, place, and time. No distress.  HENT:  Head: Normocephalic and atraumatic.  Eyes: Conjunctivae and EOM are normal. Pupils are equal, round, and reactive to light.  Cardiovascular: Normal rate, regular rhythm and normal heart sounds.   Pulmonary/Chest: Effort normal and breath sounds normal. No respiratory distress. She has no wheezes. She has no rales. She exhibits no tenderness.    Abdominal: Soft. Bowel sounds are normal.  Musculoskeletal: She exhibits no edema and no tenderness.  Neurological: She is alert and oriented to person, place, and time. No cranial nerve deficit. Coordination normal.  Skin: Skin is warm and dry. She is not diaphoretic. No erythema.  Psychiatric: Affect normal.     Labs reviewed: Basic Metabolic Panel:  Recent Labs  05/15/13 1043 07/23/13 1243 10/15/13 1657  NA 141 143 140  K 4.0 4.1 4.6  CL 105 103 103  CO2  27 22 19   GLUCOSE 181* 181* 146*  BUN 13 22 31*  CREATININE 1.1 1.00 1.00  CALCIUM 9.6 9.6 9.7  TSH  --  2.070  --    Liver Function Tests:  Recent Labs  02/12/13 1539 07/23/13 1243  AST 22 21  ALT 14 14  ALKPHOS 83 95  BILITOT 0.3 0.4  PROT 6.7 6.7   No results found for this basename: LIPASE, AMYLASE,  in the last 8760 hours No results found for this basename: AMMONIA,  in the last 8760 hours CBC:  Recent Labs  02/12/13 1539 05/15/13 1043 07/23/13 1243  WBC 9.5 6.8 8.7  NEUTROABS 5.9 4.4 6.3  HGB 13.3 13.4 13.3  HCT 39.2 40.2 40.8  MCV 85 88.3 89  PLT 254 226.0 248   Lipid Panel: No results found for this basename: CHOL, HDL, LDLCALC, TRIG, CHOLHDL, LDLDIRECT,  in the last 8760 hours TSH:  Recent Labs  07/23/13 1243  TSH 2.070   A1C: Lab Results  Component Value Date   HGBA1C 7.0* 07/23/2013     Assessment/Plan 1. Depression -celexa not working, has failed this and zoloft -increased irritability -will titrate off celexa and start on brintellix 5 mg daily for 1 week then to increase to 10 mg daily    2. Muscle spasm of back Improved, cont with therapy at home and working on increasing activity   3. Shortness of breath -has been worked up by cardiology without findings, recent xray negative -walked the halls no drop in sats, oxygen remained at 97-98% heart rate went from 90-100 and then resumed to baseline quickly once stopped and shortness of breath resolved, most likely due to  exercise intolerance admits to not doing much around the house and minimal activity on a daily basis    Will follow up in 4 weeks on depression

## 2013-11-12 NOTE — ED Notes (Signed)
Pt voided approx 82ml in bedpan.  Family remains at bedside.

## 2013-11-12 NOTE — ED Notes (Signed)
ICE pack applied to right forehead.

## 2013-11-12 NOTE — ED Notes (Signed)
Pt to ED via [facility] after reported falling.  Pt has lg hematoma to left forehead.  EMS st's pt's daughter st's pt had LOC for approx 1-2 mins.

## 2013-11-12 NOTE — Patient Instructions (Signed)
Take 1/2 of celexa for 1 week then stop Start taking 5 mg of brintellix for 1 week then increase to 10 mg daily   Follow up in 1 month to see if this is helping

## 2013-11-12 NOTE — ED Notes (Signed)
Pt in CT at this time.  Family waiting in room.

## 2013-11-12 NOTE — ED Provider Notes (Signed)
CSN: [id number]     Arrival date & time 11/12/13  1757 History   First MD Initiated Contact with Patient 11/12/13 1833     Chief Complaint  Patient presents with  . Fall     (Consider location/radiation/quality/duration/timing/severity/associated sxs/prior Treatment) Patient is a 78 y.o. female presenting with head injury.  Head Injury Location:  Frontal Time since incident:  1 hour Pain details:    Quality:  Aching   Radiates to:  Face   Severity:  Moderate Associated symptoms: headache   Associated symptoms: no nausea and no vomiting     Past Medical History  Diagnosis Date  . Hypertension   . Osteoarthritis   . Hypercholesterolemia   . Paroxysmal atrial fibrillation     CHADS2: 2; managed with rate control and anticoagulation; s/p AVN ablation 04/2013  . Syncope and collapse     When patient stands up real quickly  . Normal cardiac stress test     a.  lexiscan MV - EF 72%, No ischemia  . Anemia     ? of lab error vs GI bleed  . COPD (chronic obstructive pulmonary disease)   . Chronic anticoagulation     On Xarelto  . Exertional dyspnea   . Type II diabetes mellitus   . History of blood transfusion     "wasn't needed; had some bad lab results" (2012/03/18)  . Depression   . Prolonged grief reaction     "husband died 1 yr ago" (Mar 18, 2012)  . Urinary incontinence   . Benign paroxysmal positional vertigo 07/22/2012  . Tinnitus of left ear 07/22/2012  . Unspecified constipation   . Insomnia, unspecified   . Anxiety state, unspecified    Past Surgical History  Procedure Laterality Date  . Appendectomy  1952  . Cervical disc surgery  04/2009  . Hip arthroplasty  11/08/2011    Procedure: ARTHROPLASTY BIPOLAR HIP;  Surgeon: Mauri Pole, MD;  Location: [place];  Service: Orthopedics;  Laterality: Left;  . Insert / replace / remove pacemaker  03/18/12  . Tubal ligation  1974  . Cholecystectomy  1984  . Polypectomy  11/21/1999    Endometrial polyps  . Eye surgery Right  03/2006    Cataract surgery  . Eye surgery  11/2004    Lid surgery  . Ablation  05/22/2013    AVN ablation by Dr Caryl Comes   Family History  Problem Relation Age of Onset  . Arthritis Mother   . Kidney nephrosis Mother   . Ulcers Mother     Peptic  . Heart failure Mother     CHF  . Cancer Father     Kidney  . Early death Son     Self-inflicted gunshot   History  Substance Use Topics  . Smoking status: Former Smoker -- 2.00 packs/day for 35 years    Types: Cigarettes    Quit date: 04/23/1986  . Smokeless tobacco: Never Used  . Alcohol Use: No   OB History   Grav Para Term Preterm Abortions TAB SAB Ect Mult Living                 Review of Systems  Constitutional: Negative for activity change.  HENT: Negative for congestion.   Respiratory: Negative for cough and shortness of breath.   Cardiovascular: Negative for chest pain and leg swelling.  Gastrointestinal: Negative for nausea, vomiting, abdominal pain, diarrhea, constipation, blood in stool and abdominal distention.  Genitourinary: Negative for dysuria, flank pain and vaginal  discharge.  Musculoskeletal: Negative for back pain.  Skin: Positive for wound. Negative for color change.  Neurological: Positive for headaches. Negative for syncope.  Psychiatric/Behavioral: Negative for agitation.      Allergies  Erythromycin; Penicillins; Sulfa antibiotics; Sulfonamide derivatives; Wool alcohol; Neomycin-bacitracin zn-polymyx; Nitrofurantoin; and Nitrofurantoin monohyd macro  Home Medications   Prior to Admission medications   Medication Sig Start Date End Date Taking? Authorizing Provider  Cholecalciferol (VITAMIN D PO) Take 2,000 Units by mouth daily.   Yes Historical Provider, MD  citalopram (CELEXA) 40 MG tablet Take 40 mg by mouth daily.  09/02/13  Yes Pricilla Larsson, NP  Flaxseed, Linseed, (FLAX SEED OIL PO) Take 1,400 mg by mouth daily.    Yes Historical Provider, MD  linagliptin (TRADJENTA) 5 MG TABS tablet Take  5 mg by mouth daily.   Yes Historical Provider, MD  Multiple Vitamins-Minerals (CENTRUM SILVER PO) Take 1 tablet by mouth daily.    Yes Historical Provider, MD  Multiple Vitamins-Minerals (HAIR/SKIN/NAILS) TABS Take 2 tablets by mouth daily.    Yes Historical Provider, MD  Omega-3 Fatty Acids (FISH OIL PO) Take 1 capsule by mouth daily. 1220 mg/360 Omega 3   Yes Historical Provider, MD  rivaroxaban (XARELTO) 20 MG TABS tablet Take 20 mg by mouth daily with supper.   Yes Historical Provider, MD   BP 143/73  Pulse 72  Temp(Src) 98.3 F (36.8 C) (Oral)  Resp 18  SpO2 98% Physical Exam  Constitutional: She is oriented to person, place, and time. She appears well-developed.  HENT:  Head: Normocephalic.  ecchymosis over left eye    Eyes: Pupils are equal, round, and reactive to light.  Neck: Neck supple.  Cardiovascular: Normal rate.  Exam reveals no gallop and no friction rub.   No murmur heard. Pulmonary/Chest: Effort normal and breath sounds normal. No respiratory distress.  Abdominal: Soft. She exhibits no distension. There is no tenderness. There is no rebound.  Musculoskeletal: She exhibits no edema.  Neurological: She is alert and oriented to person, place, and time.  Skin: Skin is warm.  Psychiatric: She has a normal mood and affect.    ED Course  Procedures (including critical care time) Labs Review Labs Reviewed  CBC  BASIC METABOLIC PANEL  APTT  PROTIME-INR    Imaging Review No results found.   EKG Interpretation None      MDM   Final diagnoses:  Head injury, initial encounter   89 are old female that presents after a ground-level fall. Patient states she tripped and fall hitting the front aspect of her head on pavement. Family immediately present to the emergency department. Patient had notable hematoma is alert and oriented x4. Patient has no focal neurological deficits but does have pain over the head lesion. Patient was evaluated with CT head and she does  take the Relafen oh secondary to A. fib. Patient was noted to not have any bleeding on CT imaging. Patient was further evaluated with chest x-ray which was unremarkable, troponin negative, EKG with no significant changes from previous. Patient continued to have no deficits during her stay in the emergency department. Determined that after a negative workup the patient be appropriate for discharge home. Patient was given return precautions and discharged.    Claudean Severance, MD 11/14/13 [phone]

## 2013-11-12 NOTE — Discharge Instructions (Signed)
Concussion  A concussion, or closed-head injury, is a brain injury caused by a direct blow to the head or by a quick and sudden movement (jolt) of the head or neck. Concussions are usually not life-threatening. Even so, the effects of a concussion can be serious. If you have had a concussion before, you are more likely to experience concussion-like symptoms after a direct blow to the head.   CAUSES  · Direct blow to the head, such as from running into another player during a soccer game, being hit in a fight, or hitting your head on a hard surface.  · A jolt of the head or neck that causes the brain to move back and forth inside the skull, such as in a car crash.  SIGNS AND SYMPTOMS  The signs of a concussion can be hard to notice. Early on, they may be missed by you, family members, and health care providers. You may look fine but act or feel differently.  Symptoms are usually temporary, but they may last for days, weeks, or even longer. Some symptoms may appear right away while others may not show up for hours or days. Every head injury is different. Symptoms include:  · Mild to moderate headaches that will not go away.  · A feeling of pressure inside your head.  · Having more trouble than usual:  ¨ Learning or remembering things you have heard.  ¨ Answering questions.  ¨ Paying attention or concentrating.  ¨ Organizing daily tasks.  ¨ Making decisions and solving problems.  · Slowness in thinking, acting or reacting, speaking, or reading.  · Getting lost or being easily confused.  · Feeling tired all the time or lacking energy (fatigued).  · Feeling drowsy.  · Sleep disturbances.  ¨ Sleeping more than usual.  ¨ Sleeping less than usual.  ¨ Trouble falling asleep.  ¨ Trouble sleeping (insomnia).  · Loss of balance or feeling lightheaded or dizzy.  · Nausea or vomiting.  · Numbness or tingling.  · Increased sensitivity to:  ¨ Sounds.  ¨ Lights.  ¨ Distractions.  · Vision problems or eyes that tire  easily.  · Diminished sense of taste or smell.  · Ringing in the ears.  · Mood changes such as feeling sad or anxious.  · Becoming easily irritated or angry for little or no reason.  · Lack of motivation.  · Seeing or hearing things other people do not see or hear (hallucinations).  DIAGNOSIS  Your health care provider can usually diagnose a concussion based on a description of your injury and symptoms. He or she will ask whether you passed out (lost consciousness) and whether you are having trouble remembering events that happened right before and during your injury.  Your evaluation might include:  · A brain scan to look for signs of injury to the brain. Even if the test shows no injury, you may still have a concussion.  · Blood tests to be sure other problems are not present.  TREATMENT  · Concussions are usually treated in an emergency department, in urgent care, or at a clinic. You may need to stay in the hospital overnight for further treatment.  · Tell your health care provider if you are taking any medicines, including prescription medicines, over-the-counter medicines, and natural remedies. Some medicines, such as blood thinners (anticoagulants) and aspirin, may increase the chance of complications. Also tell your health care provider whether you have had alcohol or are taking illegal drugs. This information   may affect treatment.  · Your health care provider will send you home with important instructions to follow.  · How fast you will recover from a concussion depends on many factors. These factors include how severe your concussion is, what part of your brain was injured, your age, and how healthy you were before the concussion.  · Most people with mild injuries recover fully. Recovery can take time. In general, recovery is slower in older persons. Also, persons who have had a concussion in the past or have other medical problems may find that it takes longer to recover from their current injury.  HOME  CARE INSTRUCTIONS  General Instructions  · Carefully follow the directions your health care provider gave you.  · Only take over-the-counter or prescription medicines for pain, discomfort, or fever as directed by your health care provider.  · Take only those medicines that your health care provider has approved.  · Do not drink alcohol until your health care provider says you are well enough to do so. Alcohol and certain other drugs may slow your recovery and can put you at risk of further injury.  · If it is harder than usual to remember things, write them down.  · If you are easily distracted, try to do one thing at a time. For example, do not try to watch TV while fixing dinner.  · Talk with family members or close friends when making important decisions.  · Keep all follow-up appointments. Repeated evaluation of your symptoms is recommended for your recovery.  · Watch your symptoms and tell others to do the same. Complications sometimes occur after a concussion. Older adults with a brain injury may have a higher risk of serious complications, such as a blood clot on the brain.  · Tell your teachers, school nurse, school counselor, coach, athletic trainer, or work manager about your injury, symptoms, and restrictions. Tell them about what you can or cannot do. They should watch for:  ¨ Increased problems with attention or concentration.  ¨ Increased difficulty remembering or learning new information.  ¨ Increased time needed to complete tasks or assignments.  ¨ Increased irritability or decreased ability to cope with stress.  ¨ Increased symptoms.  · Rest. Rest helps the brain to heal. Make sure you:  ¨ Get plenty of sleep at night. Avoid staying up late at night.  ¨ Keep the same bedtime hours on weekends and weekdays.  ¨ Rest during the day. Take daytime naps or rest breaks when you feel tired.  · Limit activities that require a lot of thought or concentration. These include:  ¨ Doing homework or job-related  work.  ¨ Watching TV.  ¨ Working on the computer.  · Avoid any situation where there is potential for another head injury (football, hockey, soccer, basketball, martial arts, downhill snow sports and horseback riding). Your condition will get worse every time you experience a concussion. You should avoid these activities until you are evaluated by the appropriate follow-up health care providers.  Returning To Your Regular Activities  You will need to return to your normal activities slowly, not all at once. You must give your body and brain enough time for recovery.  · Do not return to sports or other athletic activities until your health care provider tells you it is safe to do so.  · Ask your health care provider when you can drive, ride a bicycle, or operate heavy machinery. Your ability to react may be slower after a   brain injury. Never do these activities if you are dizzy.  · Ask your health care provider about when you can return to work or school.  Preventing Another Concussion  It is very important to avoid another brain injury, especially before you have recovered. In rare cases, another injury can lead to permanent brain damage, brain swelling, or death. The risk of this is greatest during the first 7-10 days after a head injury. Avoid injuries by:  · Wearing a seat belt when riding in a car.  · Drinking alcohol only in moderation.  · Wearing a helmet when biking, skiing, skateboarding, skating, or doing similar activities.  · Avoiding activities that could lead to a second concussion, such as contact or recreational sports, until your health care provider says it is okay.  · Taking safety measures in your home.  ¨ Remove clutter and tripping hazards from floors and stairways.  ¨ Use grab bars in bathrooms and handrails by stairs.  ¨ Place non-slip mats on floors and in bathtubs.  ¨ Improve lighting in dim areas.  SEEK MEDICAL CARE IF:  · You have increased problems paying attention or  concentrating.  · You have increased difficulty remembering or learning new information.  · You need more time to complete tasks or assignments than before.  · You have increased irritability or decreased ability to cope with stress.  · You have more symptoms than before.  Seek medical care if you have any of the following symptoms for more than 2 weeks after your injury:  · Lasting (chronic) headaches.  · Dizziness or balance problems.  · Nausea.  · Vision problems.  · Increased sensitivity to noise or light.  · Depression or mood swings.  · Anxiety or irritability.  · Memory problems.  · Difficulty concentrating or paying attention.  · Sleep problems.  · Feeling tired all the time.  SEEK IMMEDIATE MEDICAL CARE IF:  · You have severe or worsening headaches. These may be a sign of a blood clot in the brain.  · You have weakness (even if only in one hand, leg, or part of the face).  · You have numbness.  · You have decreased coordination.  · You vomit repeatedly.  · You have increased sleepiness.  · One pupil is larger than the other.  · You have convulsions.  · You have slurred speech.  · You have increased confusion. This may be a sign of a blood clot in the brain.  · You have increased restlessness, agitation, or irritability.  · You are unable to recognize people or places.  · You have neck pain.  · It is difficult to wake you up.  · You have unusual behavior changes.  · You lose consciousness.  MAKE SURE YOU:  · Understand these instructions.  · Will watch your condition.  · Will get help right away if you are not doing well or get worse.  Document Released: 06/30/2003 Document Revised: 04/14/2013 Document Reviewed: 10/30/2012  [employer]® Patient Information ©2015 [employer]. This information is not intended to replace advice given to you by your health care provider. Make sure you discuss any questions you have with your health care provider.

## 2013-11-14 NOTE — Progress Notes (Signed)
Quick Note:  Please report to patient. The recent labs are stable. Continue same medication and careful diet. ______ 

## 2013-11-16 MED ORDER — VORTIOXETINE HBR 10 MG PO TABS: 10.0000 mg | ORAL_TABLET | Freq: Every day | ORAL | Status: DC

## 2013-11-16 NOTE — ED Provider Notes (Signed)
I saw and evaluated the patient, reviewed the resident's note and I agree with the findings and plan.   EKG Interpretation   Date/Time:  Thursday November 12 2013 18:05:09 EDT Ventricular Rate:  74 PR Interval:    QRS Duration: 172 QT Interval:  487 QTC Calculation: 540 R Axis:   -71 Text Interpretation:  Afib/flut and V-paced complexes No further analysis  attempted due to paced rhythm ED PHYSICIAN INTERPRETATION AVAILABLE IN  [facility] Confirmed by TEST, Record ([id number]) on 11/14/2013 7:11:39 AM       Patient with fall in parking lot, history of recurrent falls. No significant injury noted, discharge with return precautions.  Ephraim Hamburger, MD 11/16/13 [phone]

## 2013-11-26 ENCOUNTER — Telehealth: Payer: Self-pay | Admitting: *Deleted

## 2013-11-26 NOTE — Telephone Encounter (Signed)
Patient caregiver called and stated that patient fell on 7/23 and went to ER. Caregiver feels like she is worse "alarming looking" stated that her bruises seem to be getting worse instead of better and the bump on head is worse. I spoke with Deberah Pelton and she stated for patient to go to [facility] to be evaluated. Caregiver, [name], Agreed and will take her.

## 2013-11-27 ENCOUNTER — Other Ambulatory Visit: Payer: Self-pay | Admitting: Nurse Practitioner

## 2013-12-03 ENCOUNTER — Ambulatory Visit: Payer: Medicare Other | Admitting: Internal Medicine

## 2013-12-08 ENCOUNTER — Encounter: Payer: Self-pay | Admitting: Cardiology

## 2013-12-08 ENCOUNTER — Ambulatory Visit (INDEPENDENT_AMBULATORY_CARE_PROVIDER_SITE_OTHER): Payer: Medicare Other | Admitting: Cardiology

## 2013-12-08 VITALS — BP 153/58 | HR 71 | Ht 65.5 in | Wt 141.0 lb

## 2013-12-08 DIAGNOSIS — I509 Heart failure, unspecified: Secondary | ICD-10-CM

## 2013-12-08 DIAGNOSIS — I4891 Unspecified atrial fibrillation: Secondary | ICD-10-CM

## 2013-12-08 DIAGNOSIS — Z7901 Long term (current) use of anticoagulants: Secondary | ICD-10-CM

## 2013-12-08 DIAGNOSIS — R0602 Shortness of breath: Secondary | ICD-10-CM

## 2013-12-08 DIAGNOSIS — I119 Hypertensive heart disease without heart failure: Secondary | ICD-10-CM

## 2013-12-08 DIAGNOSIS — I482 Chronic atrial fibrillation, unspecified: Secondary | ICD-10-CM

## 2013-12-08 NOTE — Progress Notes (Signed)
Erin Vang Date of Birth:  Aug 09, 1935 [facility] [address] [place], [place]  [address] [phone]        Fax   [phone]   History of Present Illness: This pleasant 78 year old woman is seen for a scheduled followup office visit. She has a past history of tachybradycardia syndrome with chronic atrial fibrillation and is on Xarelto. She has a permanent pacemaker implanted by Dr. Caryl Comes. She has a history of tachybradycardia syndrome. We had great difficulty in preventing her from having very high heart rates. On 05/22/13 she underwent AV node ablation by Dr. Caryl Comes. Since then she has felt stronger.  Her last echocardiogram on 05/27/13 showed an ejection fraction of 50-55% with mild aortic insufficiency and mild mitral regurgitation.  Since we last saw her she tripped and fell on a sidewalk on 11/12/13 and struck her left temporal area.  She went to the emergency room.  There were no fractures.  She had an electrocardiogram on 11/12/13 which showed atrial fibrillation and a paced ventricular response. The patient has a past history of depression.  She previously was on citalopram.  More recently she was switched to Brintellix.  However she stopped it on her own because it was causing constipation and she did not think she needed an antidepressant.  Current Outpatient Prescriptions  Medication Sig Dispense Refill  . Cholecalciferol (VITAMIN D PO) Take 2,000 Units by mouth daily.      . Flaxseed, Linseed, (FLAX SEED OIL PO) Take 1,400 mg by mouth daily.       Marland Kitchen linagliptin (TRADJENTA) 5 MG TABS tablet Take 5 mg by mouth daily.      . Multiple Vitamins-Minerals (CENTRUM SILVER PO) Take 1 tablet by mouth daily.       . Multiple Vitamins-Minerals (HAIR/SKIN/NAILS) TABS Take 2 tablets by mouth daily.       . Omega-3 Fatty Acids (FISH OIL PO) Take 1 capsule by mouth daily. 1220 mg/360 Omega 3      . rivaroxaban (XARELTO) 20 MG TABS tablet Take 20 mg by mouth daily with  supper.      . Vortioxetine HBr (BRINTELLIX) 10 MG TABS Take 1 tablet (10 mg total) by mouth daily. For depression  30 tablet     No current facility-administered medications for this visit.    Allergies  Allergen Reactions  . Erythromycin Hives  . Penicillins Hives and Rash  . Sulfa Antibiotics Hives  . Sulfonamide Derivatives Hives and Rash  . Wool Alcohol [Lanolin Alcohol] Hives  . Neomycin-Bacitracin Zn-Polymyx Rash  . Nitrofurantoin Other (See Comments)    Unknown   . Nitrofurantoin Monohyd Macro Other (See Comments)    Unknown     Patient Active Problem List   Diagnosis Date Noted  . Muscle spasm of back 10/30/2013  . Atrioventricular block, complete 05/22/2013  . Congestive heart failure, unspecified 05/05/2013  . Urge incontinence 02/12/2013  . Insomnia 08/13/2012  . Depression 08/04/2012  . Dizziness and giddiness 07/22/2012  . Tinnitus of left ear 07/22/2012  . Tachy-brady syndrome 03/01/2012  . Pacemaker-Medtronic 02/29/2012  . Closed left hip fracture 11/08/2011  . Reactive depression 05/25/2011  . Anorexia 05/25/2011  . Anemia 05/25/2011  . Weight loss, non-intentional 05/25/2011  . Constipation, chronic 05/25/2011  . Normal cardiac stress test   . Atrial fibrillation   . COPD (chronic obstructive pulmonary disease) 10/10/2010  . Benign hypertensive heart disease without heart failure 09/11/2010  . Osteoarthritis   . Hypercholesterolemia   .  ALLERGIC RHINITIS 07/26/2009  . DIABETES MELLITUS, TYPE II 07/25/2009  . ANXIETY 07/25/2009  . HYPERTENSION 07/25/2009  . DYSPNEA 07/25/2009    History  Smoking status  . Former Smoker -- 2.00 packs/day for 35 years  . Types: Cigarettes  . Quit date: 04/23/1986  Smokeless tobacco  . Never Used    History  Alcohol Use No    Family History  Problem Relation Age of Onset  . Arthritis Mother   . Kidney nephrosis Mother   . Ulcers Mother     Peptic  . Heart failure Mother     CHF  . Cancer Father      Kidney  . Early death Son     Self-inflicted gunshot  . Heart attack Neg Hx     Review of Systems: Constitutional: no fever chills diaphoresis or fatigue or change in weight.  Head and neck: no hearing loss, no epistaxis, no photophobia or visual disturbance. Respiratory: No cough, shortness of breath or wheezing. Cardiovascular: No chest pain peripheral edema, palpitations. Gastrointestinal: No abdominal distention, no abdominal pain, no change in bowel habits hematochezia or melena. Genitourinary: No dysuria, no frequency, no urgency, no nocturia. Musculoskeletal:No arthralgias, no back pain, no gait disturbance or myalgias. Neurological: No dizziness, no headaches, no numbness, no seizures, no syncope, no weakness, no tremors. Hematologic: No lymphadenopathy, no easy bruising. Psychiatric: No confusion, no hallucinations, no sleep disturbance.    Physical Exam: Filed Vitals:   12/08/13 1404  BP: 153/58  Pulse: 71   the general appearance reveals a well-developed well-nourished elderly woman in no distress.  There is a large lump on the left side of her forehead but she states that it is getting smaller. The head and neck exam reveals pupils equal and reactive.  Extraocular movements are full.  There is no scleral icterus.  The mouth and pharynx are normal.  The neck is supple.  The carotids reveal no bruits.  The jugular venous pressure is normal.  The  thyroid is not enlarged.  There is no lymphadenopathy.  The chest is clear to percussion and auscultation.  There are no rales or rhonchi.  Expansion of the chest is symmetrical.  The precordium is quiet.  The first heart sound is normal.  The second heart sound is physiologically split.  There is no murmur gallop rub or click.  There is no abnormal lift or heave.  The abdomen is soft and nontender.  The bowel sounds are normal.  The liver and spleen are not enlarged.  There are no abdominal masses.  There are no abdominal bruits.   Extremities reveal good pedal pulses.  There is no phlebitis or edema.  There is no cyanosis or clubbing.  Strength is normal and symmetrical in all extremities.  There is no lateralizing weakness.  There are no sensory deficits.  The skin is warm and dry.  There is no rash.     Assessment / Plan: 1. permanent atrial fibrillation, on long-term Xarelto 2.  Status post pacemaker and AV node ablation 3. situational depression, improving 4. Hypercholesterolemia 5. essential hypertension 6. osteoarthritis  Disposition: Continue current medication.  No indication for furosemide at this point.  Recheck in 6 months for office visit and EKG

## 2013-12-08 NOTE — Patient Instructions (Addendum)
Your physician wants you to follow-up in: [place]. You will receive a reminder letter in the mail two months in advance. If you don't receive a letter, please call our office to schedule the follow-up appointment.   Your physician recommends that you continue on your current medications as directed. Please refer to the Current Medication list given to you today.

## 2013-12-08 NOTE — Assessment & Plan Note (Signed)
Blood pressure has been stable on current therapy.

## 2013-12-08 NOTE — Assessment & Plan Note (Signed)
The patient has not been any recent symptoms of congestive heart failure.  Because of bladder urgency she did not take the Lasix.  She is not on any diuretic.  Nonetheless she is no longer having any peripheral edema.

## 2013-12-08 NOTE — Assessment & Plan Note (Signed)
Patient has permanent atrial fibrillation.  She has had AV node ablation.  Her heart rates are now controlled.  She has not been having a racing of her heart.  She has not had any TIA or stroke symptoms.

## 2013-12-10 ENCOUNTER — Ambulatory Visit (INDEPENDENT_AMBULATORY_CARE_PROVIDER_SITE_OTHER): Payer: Medicare Other | Admitting: Nurse Practitioner

## 2013-12-10 ENCOUNTER — Encounter: Payer: Self-pay | Admitting: Nurse Practitioner

## 2013-12-10 VITALS — HR 88 | Temp 98.2°F | Ht 65.5 in | Wt 141.0 lb

## 2013-12-10 DIAGNOSIS — F329 Major depressive disorder, single episode, unspecified: Secondary | ICD-10-CM

## 2013-12-10 DIAGNOSIS — R296 Repeated falls: Secondary | ICD-10-CM

## 2013-12-10 DIAGNOSIS — R2681 Unsteadiness on feet: Secondary | ICD-10-CM

## 2013-12-10 DIAGNOSIS — I482 Chronic atrial fibrillation, unspecified: Secondary | ICD-10-CM

## 2013-12-10 DIAGNOSIS — I4891 Unspecified atrial fibrillation: Secondary | ICD-10-CM

## 2013-12-10 DIAGNOSIS — R413 Other amnesia: Secondary | ICD-10-CM

## 2013-12-10 DIAGNOSIS — F32A Depression, unspecified: Secondary | ICD-10-CM

## 2013-12-10 DIAGNOSIS — Z9181 History of falling: Secondary | ICD-10-CM

## 2013-12-10 DIAGNOSIS — R269 Unspecified abnormalities of gait and mobility: Secondary | ICD-10-CM

## 2013-12-10 DIAGNOSIS — F3289 Other specified depressive episodes: Secondary | ICD-10-CM

## 2013-12-10 NOTE — Progress Notes (Signed)
[name], female   DOB: 17-Jan-1936, 78 y.o.   MRN: [id number]    Allergies  Allergen Reactions  . Erythromycin Hives  . Penicillins Hives and Rash  . Sulfa Antibiotics Hives  . Sulfonamide Derivatives Hives and Rash  . Wool Alcohol [Lanolin Alcohol] Hives  . Neomycin-Bacitracin Zn-Polymyx Rash  . Nitrofurantoin Other (See Comments)    Unknown   . Nitrofurantoin Monohyd Macro Other (See Comments)    Unknown     Chief Complaint  Patient presents with  . Medical Management of Chronic Issues    4 week follow-up, seen in ER for fall, same day as last OV     HPI: Patient is a 78 y.o. female seen in the office today for follow up on anxiety and depression. Currently no taking any of the depression medication. Reports she is not depressed. Did not like the brintellix- took it 2 days and reports it made her constipated. Pt states her cardiologist reported she did not seem to be depressed so she reports she is not. Pt reports she does not feel like she is anxious. Reports mood is good however daughter reports pt is very depressed, withdrawn and gets agitated easily, they frequently fight  After last OV pt was outside and balance became off, she hit head and knocked her out, went to ED where they worked her up and CT scan was negative for bleed. Pt now walking with walker at all times  Review of Systems:  Review of Systems  Constitutional: Negative for malaise/fatigue.  HENT: Negative for hearing loss and sore throat.   Respiratory: Positive for shortness of breath (with increased activity- has not changed). Negative for cough and sputum production.   Cardiovascular: Positive for palpitations (at times when she has increased activities). Negative for chest pain and leg swelling.  Gastrointestinal: Negative for diarrhea and constipation.  Genitourinary: Negative for dysuria, urgency and frequency.  Musculoskeletal: Positive for falls. Negative for back pain, joint pain and  myalgias.  Skin: Negative.   Neurological: Negative for dizziness, tingling, sensory change, speech change, focal weakness, loss of consciousness, weakness and headaches.  Psychiatric/Behavioral: Positive for depression and memory loss. Negative for suicidal ideas. The patient is nervous/anxious.        See HPI     Past Medical History  Diagnosis Date  . Hypertension   . Osteoarthritis   . Hypercholesterolemia   . Paroxysmal atrial fibrillation     CHADS2: 2; managed with rate control and anticoagulation; s/p AVN ablation 04/2013  . Syncope and collapse     When patient stands up real quickly  . Normal cardiac stress test     a.  lexiscan MV - EF 72%, No ischemia  . Anemia     ? of lab error vs GI bleed  . COPD (chronic obstructive pulmonary disease)   . Chronic anticoagulation     On Xarelto  . Exertional dyspnea   . Type II diabetes mellitus   . History of blood transfusion     "wasn't needed; had some bad lab results" (03-02-2012)  . Depression   . Prolonged grief reaction     "husband died 1 yr ago" (03-02-2012)  . Urinary incontinence   . Benign paroxysmal positional vertigo 07/22/2012  . Tinnitus of left ear 07/22/2012  . Unspecified constipation   . Insomnia, unspecified   . Anxiety state, unspecified    Past Surgical History  Procedure Laterality Date  . Appendectomy  1952  . Cervical  disc surgery  04/2009  . Hip arthroplasty  11/08/2011    Procedure: ARTHROPLASTY BIPOLAR HIP;  Surgeon: Mauri Pole, MD;  Location: [facility];  Service: Orthopedics;  Laterality: Left;  . Insert / replace / remove pacemaker  02/29/2012  . Tubal ligation  1974  . Cholecystectomy  1984  . Polypectomy  11/21/1999    Endometrial polyps  . Eye surgery Right 03/2006    Cataract surgery  . Eye surgery  11/2004    Lid surgery  . Ablation  05/22/2013    AVN ablation by Dr Caryl Comes   Social History:   reports that she quit smoking about 27 years ago. Her smoking use included Cigarettes. She has a 70  pack-year smoking history. She has never used smokeless tobacco. She reports that she does not drink alcohol or use illicit drugs.  Family History  Problem Relation Age of Onset  . Arthritis Mother   . Kidney nephrosis Mother   . Ulcers Mother     Peptic  . Heart failure Mother     CHF  . Cancer Father     Kidney  . Early death Son     Self-inflicted gunshot  . Heart attack Neg Hx     Medications: Patient's Medications  New Prescriptions   No medications on file  Previous Medications   CHOLECALCIFEROL (VITAMIN D PO)    Take 2,000 Units by mouth daily.   FLAXSEED, LINSEED, (FLAX SEED OIL PO)    Take 1,400 mg by mouth daily.    LINAGLIPTIN (TRADJENTA) 5 MG TABS TABLET    Take 5 mg by mouth daily.   MULTIPLE VITAMINS-MINERALS (CENTRUM SILVER PO)    Take 1 tablet by mouth daily.    MULTIPLE VITAMINS-MINERALS (HAIR/SKIN/NAILS) TABS    Take 2 tablets by mouth daily.    OMEGA-3 FATTY ACIDS (FISH OIL PO)    Take 1 capsule by mouth daily. 1220 mg/360 Omega 3   RIVAROXABAN (XARELTO) 20 MG TABS TABLET    Take 20 mg by mouth daily with supper.  Modified Medications   No medications on file  Discontinued Medications   VORTIOXETINE HBR (BRINTELLIX) 10 MG TABS    Take 1 tablet (10 mg total) by mouth daily. For depression     Physical Exam:  Filed Vitals:   12/10/13 1434  Pulse: 88  Temp: 98.2 F (36.8 C)  TempSrc: Oral  Height: 5' 5.5" (1.664 m)  Weight: 141 lb (63.957 kg)  SpO2: 98%    Physical Exam  Constitutional: She is oriented to person, place, and time. No distress.  HENT:  Head: Normocephalic and atraumatic.  Eyes: Conjunctivae and EOM are normal. Pupils are equal, round, and reactive to light.  Cardiovascular: Normal rate, regular rhythm and normal heart sounds.   Pulmonary/Chest: Effort normal and breath sounds normal. No respiratory distress. She has no wheezes. She has no rales. She exhibits no tenderness.  Abdominal: Soft. Bowel sounds are normal.    Musculoskeletal: She exhibits no edema and no tenderness.  Neurological: She is alert and oriented to person, place, and time. No cranial nerve deficit. Coordination normal.  Skin: Skin is warm and dry. She is not diaphoretic. No erythema.  Psychiatric: Affect normal.     Labs reviewed: Basic Metabolic Panel:  Recent Labs  07/23/13 1243 10/15/13 1657 11/12/13 2047  NA 143 140 143  K 4.1 4.6 4.4  CL 103 103 105  CO2 22 19 21   GLUCOSE 181* 146* 104*  BUN 22 31*  21  CREATININE 1.00 1.00 0.84  CALCIUM 9.6 9.7 9.6  TSH 2.070  --   --    Liver Function Tests:  Recent Labs  02/12/13 1539 07/23/13 1243  AST 22 21  ALT 14 14  ALKPHOS 83 95  BILITOT 0.3 0.4  PROT 6.7 6.7   No results found for this basename: LIPASE, AMYLASE,  in the last 8760 hours No results found for this basename: AMMONIA,  in the last 8760 hours CBC:  Recent Labs  02/12/13 1539 05/15/13 1043 07/23/13 1243 11/12/13 2047  WBC 9.5 6.8 8.7 12.0*  NEUTROABS 5.9 4.4 6.3  --   HGB 13.3 13.4 13.3 13.6  HCT 39.2 40.2 40.8 40.8  MCV 85 88.3 89 89.3  PLT 254 226.0 248 291   Lipid Panel: No results found for this basename: CHOL, HDL, LDLCALC, TRIG, CHOLHDL, LDLDIRECT,  in the last 8760 hours TSH:  Recent Labs  07/23/13 1243  TSH 2.070   A1C: Lab Results  Component Value Date   HGBA1C 7.0* 07/23/2013     Assessment/Plan 1. Depression -pt reports she is not depressed, daughter concerned over behaviors -will dc brintillix because pt does not wish to take   2. Unsteady gait with Frequent Falls -using walker at all times -question if the best place for the patient is at home, more confusion with frequent falls. Pt on xarelto due to a fib with pacemaker -discussed in length about pt moving to [place] or getting more help (pt reports she has been looking into this but waiting on a sign from her deceased husband) daughter on board about her moving and willing to take her to places to look for alternate  living. -discussed taking off of xarelto due to risk of brain bleed with freq falls  3. Memory loss -worse per daughter, unreliable history  -daughter reports pt drove to doctors appt due to mixing up times, she plans to take the keys at this time  4. Afib -rate controlled without medications, conts on Xarelto at this time for reduce risk of clots, discussed danger with freq falls, pt and daughter understand

## 2013-12-16 ENCOUNTER — Telehealth: Payer: Self-pay | Admitting: Neurology

## 2013-12-16 NOTE — Telephone Encounter (Signed)
It is ultimately up to the patient. I usually welcome family member input.

## 2013-12-16 NOTE — Telephone Encounter (Signed)
Patient called and stated she doesn't want daughter to come in exam room tomorrow at appt.  FYI

## 2013-12-18 ENCOUNTER — Encounter: Payer: Self-pay | Admitting: Neurology

## 2013-12-18 ENCOUNTER — Ambulatory Visit (INDEPENDENT_AMBULATORY_CARE_PROVIDER_SITE_OTHER): Payer: Medicare Other | Admitting: Neurology

## 2013-12-18 VITALS — BP 159/74 | HR 72 | Ht 65.5 in | Wt 136.0 lb

## 2013-12-18 DIAGNOSIS — R269 Unspecified abnormalities of gait and mobility: Secondary | ICD-10-CM

## 2013-12-18 DIAGNOSIS — R4181 Age-related cognitive decline: Secondary | ICD-10-CM

## 2013-12-18 DIAGNOSIS — R42 Dizziness and giddiness: Secondary | ICD-10-CM

## 2013-12-18 DIAGNOSIS — R54 Age-related physical debility: Secondary | ICD-10-CM

## 2013-12-18 DIAGNOSIS — R296 Repeated falls: Secondary | ICD-10-CM

## 2013-12-18 DIAGNOSIS — Z9181 History of falling: Secondary | ICD-10-CM

## 2013-12-18 NOTE — Patient Instructions (Addendum)
Drink more water.  Start looking for assisted living places with your daughter and your son.  Use your walker at all times. Please stop driving.  Please talk to Janett Billow about restarting an antidepressant.   We will see you in 6 mo.

## 2013-12-18 NOTE — Progress Notes (Signed)
Subjective:    [name] is a 78 y.o. female.  HPI    Interim history:   Erin Vang is a 78 year old right-handed woman with an underlying complex history of hypertension, osteoarthritis, hyperlipidemia, paroxysmal A. fib, history of syncope and collapse, anemia, COPD, type 2 diabetes, depression and anxiety but currently not treated, urinary incontinence, paroxysmal vertigo, insomnia, status post appendectomy, cervical spine surgery in 2011, hip arthroplasty in 2013, pacemaker placement, and recent cardiac ablation, who presents for followup consultation of Erin gait and balance problems as well as recurrent falls. She is accompanied by Erin Vang today. I first met Erin on 07/22/2012, at which time she reported balance problems and recurrent falls for several months, maybe even a few years. She reported falling once or twice per month on average. She was in home health PT at the time. She was not driving at the time. She reported a sense of vertigo and lightheadedness when changing positions quickly. I suggested we proceed with a head CT without contrast and carotid Doppler studies and I advised Erin to continue with physical therapy. I also request Erin to seek consultation with ENT and had made a referral. Erin carotid Doppler study from 08/01/2012 showed no significant stenosis. Erin head CT without contrast from 08/01/2012 showed: Mildly abnormal CT head (without) demonstrating: 1. Mild frontal and perisylvian atrophy. 2. Mild periventricular and subcortical chronic small vessel ischemic disease. 3. No acute findings. We called Erin Vang with Erin test results.   She recently was taken to the emergency room via EMS after she fell in the parking lot after being seen by Erin doctor. This happened on 11/12/2013. She did not need admission. She had a head CT without contrast since she had struck Erin head and there was reported loss of consciousness of one to 2 minutes per Vang. I reviewed  the report: 1. No evidence of traumatic intracranial injury or fracture. 2. Prominent scalp hematoma noted overlying the left frontal calvarium. 3. Mild cortical volume loss and scattered small vessel ischemic microangiopathy. In addition, personally reviewed the images through the PACS system. She was advised to use Erin walker at all times by Erin primary care provider. She did not present for followup until today.  Today, she reports feeling stable. Erin Vang provided a separate sheet of paper with Erin concerns listed. Vang on this write up reports unsafe behaviors and noncompliance as far as medication and medical advice is concerned. The Vang also states that she has taken away Erin mother's car keys. She had recently restarted Zoloft but stopped it again. She has started chewing nicotine gum when she was a nonsmoker for 20 years. She and Erin brother of the only children. They have had a hard time convincing Erin mom to consider moving to an assisted living facility. The patient had agreed last year to start visiting some places but has yet to agree to actually go to places. She has been using Erin walker consistently and has had home health physical therapy which helped.   She currently lives alone but has had a discussion with Erin primary care provider regarding transitioning into assisted living. Workup in the past had included a MRI lumbar spine in 2000, which showed multilevel facet arthropathy, moderate central spinal canal stenosis and right foraminal narrowing at L4-5. She had an EMG in 2010 which apparently was normal. She had nerve conduction study as well which was normal. She had a CT of the cervical spine in 2010 which  showed multilevel cervical spondylosis, most severe at C5-6 with moderate central canal stenosis secondary to large disc osteophyte complex. Bilateral foraminal stenosis predominantly do to spurring. She had echocardiogram in 2011 which showed mild LVH with normal systolic  function and no wall motion abnormalities. She has CT cervical spine in 2011 which was a suboptimal study. She had a repeat echocardiogram and February 2013 which showed mild LVH, EF was estimated to be 55%, mild mitral valve regurgitation.  She had a PM placed in '13 and Erin falls and dizziness were perhaps a little better. She has been monitoring Erin BP, which has been in the 120s-150s/high 50s-70s, BS have been in the 120s to 160s fasting. She reported intermittent tinnitus particularly on the left side.  Erin Past Medical History Is Significant For: Past Medical History  Diagnosis Date  . Hypertension   . Osteoarthritis   . Hypercholesterolemia   . Paroxysmal atrial fibrillation     CHADS2: 2; managed with rate control and anticoagulation; s/p AVN ablation 04/2013  . Syncope and collapse     When patient stands up real quickly  . Normal cardiac stress test     a.  lexiscan MV - EF 72%, No ischemia  . Anemia     ? of lab error vs GI bleed  . COPD (chronic obstructive pulmonary disease)   . Chronic anticoagulation     On Xarelto  . Exertional dyspnea   . Type II diabetes mellitus   . History of blood transfusion     "wasn't needed; had some bad lab results" (March 22, 2012)  . Depression   . Prolonged grief reaction     "husband died 1 yr ago" (2012/03/22)  . Urinary incontinence   . Benign paroxysmal positional vertigo 07/22/2012  . Tinnitus of left ear 07/22/2012  . Unspecified constipation   . Insomnia, unspecified   . Anxiety state, unspecified     Erin Past Surgical History Is Significant For: Past Surgical History  Procedure Laterality Date  . Appendectomy  1952  . Cervical disc surgery  04/2009  . Hip arthroplasty  11/08/2011    Procedure: ARTHROPLASTY BIPOLAR HIP;  Surgeon: Mauri Pole, MD;  Location: [facility];  Service: Orthopedics;  Laterality: Left;  . Insert / replace / remove pacemaker  03/22/2012  . Tubal ligation  1974  . Cholecystectomy  1984  . Polypectomy  11/21/1999     Endometrial polyps  . Eye surgery Right 03/2006    Cataract surgery  . Eye surgery  11/2004    Lid surgery  . Ablation  05/22/2013    AVN ablation by Dr Caryl Comes    Erin Family History Is Significant For: Family History  Problem Relation Age of Onset  . Arthritis Mother   . Kidney nephrosis Mother   . Ulcers Mother     Peptic  . Heart failure Mother     CHF  . Cancer Father     Kidney  . Early death Vang     Self-inflicted gunshot  . Heart attack Neg Hx     Erin Social History Is Significant For: History   Social History  . Marital Status: Widowed    Spouse Name: N/A    Number of Children: 3  . Years of Education: HS   Occupational History  .   Other   Social History Main Topics  . Smoking status: Former Smoker -- 2.00 packs/day for 35 years    Types: Cigarettes    Quit date: 04/23/1986  .  Smokeless tobacco: Never Used  . Alcohol Use: No  . Drug Use: No  . Sexual Activity: Not Currently   Other Topics Concern  . Not on file   Social History Narrative   Widowed in 2012.  Lives alone in [place].  Independent @ home.  Has been struggling with loss of husband.  Dtr near by.  Exercises in pulmonary rehab 2x/wk.   Caffeine use: none    Erin Allergies Are:  Allergies  Allergen Reactions  . Erythromycin Hives  . Penicillins Hives and Rash  . Sulfa Antibiotics Hives  . Sulfonamide Derivatives Hives and Rash  . Wool Alcohol [Lanolin Alcohol] Hives  . Neomycin-Bacitracin Zn-Polymyx Rash  . Nitrofurantoin Other (See Comments)    Unknown   . Nitrofurantoin Monohyd Macro Other (See Comments)    Unknown   :   Erin Current Medications Are:  Outpatient Encounter Prescriptions as of 12/18/2013  Medication Sig  . Cholecalciferol (VITAMIN D PO) Take 2,000 Units by mouth daily.  . Flaxseed, Linseed, (FLAX SEED OIL PO) Take 1,400 mg by mouth daily.   Marland Kitchen linagliptin (TRADJENTA) 5 MG TABS tablet Take 5 mg by mouth daily.  . Multiple Vitamins-Minerals (CENTRUM SILVER PO) Take 1  tablet by mouth daily.   . Multiple Vitamins-Minerals (HAIR/SKIN/NAILS) TABS Take 2 tablets by mouth daily.   . Omega-3 Fatty Acids (FISH OIL PO) Take 1 capsule by mouth daily. 1220 mg/360 Omega 3  . rivaroxaban (XARELTO) 20 MG TABS tablet Take 20 mg by mouth daily with supper.  :  Review of Systems:  Out of a complete 14 point review of systems, all are reviewed and negative with the exception of these symptoms as listed below:   Review of Systems  Constitutional: Positive for fatigue.  Respiratory: Positive for shortness of breath.   Genitourinary: Positive for urgency.       Incontinence of bladder  Musculoskeletal: Positive for gait problem.  Hematological: Bruises/bleeds easily.  Psychiatric/Behavioral: Positive for sleep disturbance. Self-injury: frequent waking.    Objective:  Neurologic Exam  Physical Exam Physical Examination:   Filed Vitals:   12/18/13 1227  BP: 159/74  Pulse: 72    General Examination: The patient is a very pleasant 78 y.o. female in no acute distress. She appears mildly deconditioned and frail.  HEENT: Normocephalic, atraumatic with the exception of a swelling in Erin left for head and temple area which seems to be healing. She has smaller bruises in Erin face which are healing. Pupils are equal, round and reactive to light and accommodation. Funduscopic exam is normal with sharp disc margins noted. Extraocular tracking is good without nystagmus noted. Normal smooth pursuit is noted. Hearing is grossly intact. Face is symmetric with normal facial animation and normal facial sensation. Speech is clear with no dysarthria noted. There is no hypophonia. There is no lip, neck or jaw tremor. Neck is supple with full range of motion. There are no carotid bruits on auscultation. Oropharynx exam reveals mild mouth dryness. No significant airway crowding is noted. Mallampati is class II. Tongue protrudes centrally and palate elevates symmetrically. Tonsils are 1+.    Chest: is clear to auscultation without wheezing, rhonchi or crackles noted.  Heart: sounds are irregularly irregular regular without murmurs, rubs or gallops noted.   Abdomen: is soft, non-tender and non-distended with normal bowel sounds appreciated on auscultation.  Extremities: There is no pitting edema in the distal lower extremities bilaterally. Pedal pulses are intact.  Skin: is warm and dry with no trophic  changes noted.  Musculoskeletal: exam reveals no obvious joint deformities, tenderness or joint swelling or erythema.  Neurologically:  Mental status: The patient is awake, alert and oriented in all 4 spheres. Erin memory, attention, language and knowledge are impaired. There is no aphasia, agnosia, apraxia or anomia. There is mild slowness in thinking. Speech is  mildly slow and deliberate. Mood is  mildly depressed appearing affect is flat.  Cranial nerves are as described above under HEENT exam. In addition, shoulder shrug is normal with equal shoulder height noted. Motor exam: Normal bulk, strength  for age and tone is noted. There is no drift, tremor or rebound. Romberg is  not tested. Reflexes are 1+ throughout. Fine motor skills are  mildly impaired throughout.   Cerebellar testing shows no dysmetria or intention tremor on finger to nose testing. There is no truncal or gait ataxia.  Sensory exam is intact to light touch, pinprick, vibration, temperature sense in the upper and lower extremities.  Gait, station and balance: she is able to stand with a slightly wider base. She pushes herself. She walks with a 2 wheeled walker and while she maneuvers this very well with fairly good pace and stride length noticed, she does turn a little too fast and becomes slightly insecure while turning. I did not check tandem walk.   Assessment and Plan:    In summary, EMMAJO BENNETTE is a very pleasant 78 year old female with an underlying complex history of hypertension, osteoarthritis,  hyperlipidemia, paroxysmal A. fib, history of syncope and collapse, anemia, COPD, type 2 diabetes, depression and anxiety but currently not treated, urinary incontinence, paroxysmal vertigo, insomnia, status post appendectomy, cervical spine surgery in 2011, hip arthroplasty in 2013, pacemaker placement, and recent cardiac ablation, who presents for followup consultation of Erin gait and balance problems as well as recurrent falls. Erin physical examination is worse than last time. She has had more falls recently. Erin gait is worse. She has recently had a fall with injury and has since then been seen by physical therapy and uses a 2 wheeled walker. She no longer drives. Today a ladder long discussion with the patient and Erin Vang and spent most of our 40 minute visit and coordination of care, reviewing test results, and counseling. I asked the patient to strongly consider transitioning into assisted living and while she was very reluctant in the beginning of our appointment I think she was open to this suggestion by the end of the appointment. She strongly advised to talk to Erin primary care provider about restarting Erin antidepressant. Apparently she did have some constipation on the last antidepressant tried. She is advised to give it at least 6-8 weeks before dismissing or changing an antidepressant as it may take this long to kick in. She is advised to drink more water and supplement Erin meals with protein milkshakes such as Ensure or boost. If she is worried about Erin blood sugar levels she can use something like Glucerna. I have asked Erin to use Erin walker at all times and no longer use a cane. She is advised to change positions slowly. She was reluctant to make a followup appointment with me but I suggested we check in with Erin in about 6 months. I did not suggest any new medications other than above. I answered all their questions today and the patient and Erin Vang were in agreement.

## 2014-01-01 ENCOUNTER — Telehealth: Payer: Self-pay | Admitting: *Deleted

## 2014-01-01 NOTE — Telephone Encounter (Signed)
Patient called very upset that Erin Vang discussed her depression and options of Assisted Livings with her daughter. Patient said she never agreed to you discussing her or her information with the daughter. Patient states that she does not have depression.

## 2014-01-02 ENCOUNTER — Other Ambulatory Visit: Payer: Self-pay | Admitting: Nurse Practitioner

## 2014-01-04 ENCOUNTER — Telehealth: Payer: Self-pay | Admitting: Cardiology

## 2014-01-04 ENCOUNTER — Ambulatory Visit (INDEPENDENT_AMBULATORY_CARE_PROVIDER_SITE_OTHER): Payer: Medicare Other | Admitting: *Deleted

## 2014-01-04 DIAGNOSIS — I495 Sick sinus syndrome: Secondary | ICD-10-CM

## 2014-01-04 NOTE — Telephone Encounter (Signed)
Spoke with pt and reminded pt of remote transmission that is due today. Pt verbalized understanding.   

## 2014-01-05 LAB — MDC_IDC_ENUM_SESS_TYPE_REMOTE
Battery Impedance: 349 Ohm
Battery Remaining Longevity: 92 mo
Battery Voltage: 2.77 V
Brady Statistic RV Percent Paced: 82 %
Lead Channel Impedance Value: 0 Ohm
Lead Channel Pacing Threshold Pulse Width: 0.4 ms
Lead Channel Setting Pacing Amplitude: 2.5 V
Lead Channel Setting Sensing Sensitivity: 4 mV
MDC IDC MSMT LEADCHNL RV IMPEDANCE VALUE: 669 Ohm
MDC IDC MSMT LEADCHNL RV PACING THRESHOLD AMPLITUDE: 0.375 V
MDC IDC SESS DTM: 20150915154540
MDC IDC SET LEADCHNL RV PACING PULSEWIDTH: 0.4 ms

## 2014-01-05 NOTE — Telephone Encounter (Signed)
Noted, this was done in front of patient at the visit, if pt does not wish for her daughter to be notified of her medical history who is her emergency contact in case we have to discuss information with someone

## 2014-01-05 NOTE — Progress Notes (Signed)
Remote pacemaker transmission.   

## 2014-01-21 ENCOUNTER — Ambulatory Visit: Payer: Medicare Other | Admitting: Nurse Practitioner

## 2014-02-08 ENCOUNTER — Encounter: Payer: Self-pay | Admitting: *Deleted

## 2014-02-09 ENCOUNTER — Encounter: Payer: Self-pay | Admitting: Cardiology

## 2014-02-10 ENCOUNTER — Other Ambulatory Visit: Payer: Self-pay | Admitting: *Deleted

## 2014-02-10 MED ORDER — LINAGLIPTIN 5 MG PO TABS: ORAL_TABLET | ORAL | Status: DC

## 2014-02-10 NOTE — Telephone Encounter (Signed)
[employer]

## 2014-02-17 ENCOUNTER — Other Ambulatory Visit: Payer: Self-pay | Admitting: Internal Medicine

## 2014-02-26 ENCOUNTER — Encounter: Payer: Self-pay | Admitting: Internal Medicine

## 2014-03-02 ENCOUNTER — Ambulatory Visit (INDEPENDENT_AMBULATORY_CARE_PROVIDER_SITE_OTHER): Payer: Medicare Other | Admitting: Internal Medicine

## 2014-03-02 ENCOUNTER — Encounter: Payer: Self-pay | Admitting: Internal Medicine

## 2014-03-02 VITALS — BP 130/78 | HR 72 | Temp 98.5°F | Wt 138.0 lb

## 2014-03-02 DIAGNOSIS — Z9181 History of falling: Secondary | ICD-10-CM

## 2014-03-02 DIAGNOSIS — E119 Type 2 diabetes mellitus without complications: Secondary | ICD-10-CM

## 2014-03-02 DIAGNOSIS — F411 Generalized anxiety disorder: Secondary | ICD-10-CM

## 2014-03-02 DIAGNOSIS — I482 Chronic atrial fibrillation, unspecified: Secondary | ICD-10-CM

## 2014-03-02 DIAGNOSIS — I10 Essential (primary) hypertension: Secondary | ICD-10-CM

## 2014-03-02 DIAGNOSIS — E7800 Pure hypercholesterolemia, unspecified: Secondary | ICD-10-CM

## 2014-03-02 DIAGNOSIS — J449 Chronic obstructive pulmonary disease, unspecified: Secondary | ICD-10-CM

## 2014-03-02 DIAGNOSIS — F329 Major depressive disorder, single episode, unspecified: Secondary | ICD-10-CM

## 2014-03-02 DIAGNOSIS — F32A Depression, unspecified: Secondary | ICD-10-CM

## 2014-03-02 MED ORDER — FLUTICASONE FUROATE-VILANTEROL 100-25 MCG/INH IN AEPB: INHALATION_SPRAY | RESPIRATORY_TRACT | Status: DC

## 2014-03-02 NOTE — Progress Notes (Signed)
[name], female   DOB: [birth year], 78 y.o.   MRN: [id number]    Facility  [facility]    Place of Service:   OFFICE   Allergies  Allergen Reactions  . Erythromycin Hives  . Penicillins Hives and Rash  . Sulfa Antibiotics Hives  . Sulfonamide Derivatives Hives and Rash  . Wool Alcohol [Lanolin Alcohol] Hives  . Neomycin-Bacitracin Zn-Polymyx Rash  . Nitrofurantoin Other (See Comments)    Unknown   . Nitrofurantoin Monohyd Macro Other (See Comments)    Unknown     Chief Complaint  Patient presents with  . Medical Management of Chronic Issues    3 month follow-up, no recent labs   . Immunizations    Discuss flu vaccine     HPI:  Chronic obstructive pulmonary disease, unspecified COPD, unspecified chronic bronchitis type: persistent cough and chest congestion.  Anxiety state: chronic. Unchanged  Depression: She does not think she has been depressed. Does not want medication for this. Discontinued Brintellix.  Diabetes type 2, controlled: controlled  Essential hypertension: controlled  Hypercholesterolemia: controlled  Chronic atrial fibrillation: rate controlled  Fall history. Has seen Dr. Star Age. She suggested that patient consider a move to [place]. Patient does not intend to do this.    Medications: Patient's Medications  New Prescriptions   No medications on file  Previous Medications   ACCU-CHEK FASTCLIX LANCETS MISC    USE TO CHECK BLOOD SUGAR THREE TIMES A DAY TO CONTROL DIABETES   ACCU-CHEK SMARTVIEW TEST STRIP    USE TO CHECK BLOOD SUGAR TWICE A DAY   CHOLECALCIFEROL (VITAMIN D PO)    Take 2,000 Units by mouth daily.   FLAXSEED, LINSEED, (FLAX SEED OIL PO)    Take 1,400 mg by mouth daily.    LINAGLIPTIN (TRADJENTA) 5 MG TABS TABLET    Take one tablet by mouth once daily for diabetes   MULTIPLE VITAMINS-MINERALS (CENTRUM SILVER PO)    Take 1 tablet by mouth daily.    MULTIPLE VITAMINS-MINERALS (HAIR/SKIN/NAILS) TABS    Take 2 tablets by mouth  daily.    OMEGA-3 FATTY ACIDS (FISH OIL PO)    Take 1 capsule by mouth daily. 1220 mg/360 Omega 3   RIVAROXABAN (XARELTO) 20 MG TABS TABLET    Take 20 mg by mouth daily with supper.  Modified Medications   No medications on file  Discontinued Medications   No medications on file     Review of Systems  Constitutional: Positive for fatigue. Negative for fever, chills, diaphoresis, activity change, appetite change and unexpected weight change.  HENT: Negative for congestion, ear discharge, ear pain, hearing loss, postnasal drip, rhinorrhea, sore throat, tinnitus, trouble swallowing and voice change.   Eyes: Negative for pain, redness, itching and visual disturbance.  Respiratory: Positive for cough and shortness of breath. Negative for choking and wheezing.   Cardiovascular: Negative for chest pain, palpitations and leg swelling.  Gastrointestinal: Negative for nausea, abdominal pain, diarrhea, constipation and abdominal distention.  Endocrine: Negative for cold intolerance, heat intolerance, polydipsia, polyphagia and polyuria.  Genitourinary: Positive for urgency. Negative for dysuria, frequency, hematuria, flank pain, vaginal discharge, difficulty urinating and pelvic pain.  Musculoskeletal: Positive for gait problem. Negative for myalgias, back pain, arthralgias, neck pain and neck stiffness.  Skin: Negative for color change, pallor and rash.  Allergic/Immunologic: Negative.   Neurological: Positive for tremors. Negative for dizziness, seizures, syncope, weakness, numbness and headaches.  Hematological: Negative for adenopathy. Does not bruise/bleed easily.  Anticoagulated with Xarelto due to chronic AF.  Psychiatric/Behavioral: Positive for sleep disturbance. Negative for suicidal ideas, hallucinations, behavioral problems, confusion, dysphoric mood and agitation. The patient is nervous/anxious. The patient is not hyperactive.     Filed Vitals:   03/02/14 1453  BP: 130/78    Pulse: 72  Temp: 98.5 F (36.9 C)  TempSrc: Oral  Weight: 138 lb (62.596 kg)   Body mass index is 22.61 kg/(m^2).  Physical Exam  Constitutional: She is oriented to person, place, and time. She appears well-developed and well-nourished. No distress.  frail  HENT:  Right Ear: External ear normal.  Left Ear: External ear normal.  Nose: Nose normal.  Mouth/Throat: Oropharynx is clear and moist. No oropharyngeal exudate.  Eyes: Conjunctivae and EOM are normal. Pupils are equal, round, and reactive to light. No scleral icterus.  Neck: No JVD present. No tracheal deviation present. No thyromegaly present.  Cardiovascular: Normal rate, normal heart sounds and intact distal pulses.  Exam reveals no gallop and no friction rub.   No murmur heard. AF  Pulmonary/Chest: Effort normal. No respiratory distress. She has no wheezes. She has no rales. She exhibits no tenderness.  Pacemaker in the left upper chest.  Abdominal: She exhibits no distension and no mass. There is no tenderness.  Musculoskeletal: Normal range of motion. She exhibits no edema or tenderness.  Unstable gait. Using walker.  Lymphadenopathy:    She has no cervical adenopathy.  Neurological: She is alert and oriented to person, place, and time. No cranial nerve deficit. Coordination normal.  Skin: No rash noted. She is not diaphoretic. No erythema. No pallor.  Psychiatric: She has a normal mood and affect. Her behavior is normal. Judgment and thought content normal.     Labs reviewed: Clinical Support on 01/04/2014  Component Date Value Ref Range Status  . Date Time Interrogation Session 01/05/2014 [id number]   Final  . Pulse Generator Manufacturer 01/05/2014 Medtronic   Final  . Pulse Gen Model 01/05/2014 ADSR01 Adapta   Final  . Pulse Gen Serial Number 01/05/2014 [id number]   Final  . RV Sense Sensitivity 01/05/2014 4   Final  . RV Pace PulseWidth 01/05/2014 0.4   Final  . RV Pace Amplitude 01/05/2014 2.5   Final   . RA Impedance 01/05/2014 0   Final  . RV IMPEDANCE 01/05/2014 669   Final  . RV Pacing Amplitude 01/05/2014 0.375   Final  . RV Pacing PulseWidth 01/05/2014 0.40   Final  . Battery Status 01/05/2014 Unknown   Final  . Battery Longevity 01/05/2014 92   Final  . Battery Voltage 01/05/2014 2.77   Final  . Battery Impedance 01/05/2014 349   Final  . Loletha Grayer RV Perc Paced 01/05/2014 82   Final  . Eval Rhythm 01/05/2014 Vp   Final  . Miscellaneous Comment 01/05/2014    Final                   Value:Pacemaker remote check. Device function reviewed. Impedance, sensing, auto capture thresholds consistent with previous measurements. Histograms appropriate for patient and level of activity. All other diagnostic data reviewed and is appropriate and                          stable for patient. Real time/magnet EGM shows appropriate sensing and capture. No ventricular high rate episodes. Estimated longevity 7.5 years. [facility] 01-07-14 and ROV in 12 mths w/SK.     Assessment/Plan 1. Chronic  obstructive pulmonary disease, unspecified COPD, unspecified chronic bronchitis type Monitor. No new meds.  2. Anxiety state Monitor. No new meds.  3. Depression Off all antidepressants  4. Diabetes type 2, controlled controlled - Hemoglobin A1c; Future - Comprehensive metabolic panel; Future - Microalbumin, urine; Future  5. Essential hypertension controlled - Comprehensive metabolic panel; Future  6. Hypercholesterolemia Recheck next visit - Lipid panel; Future  7. Chronic atrial fibrillation Rate controlled and anticoagulated.

## 2014-03-13 DIAGNOSIS — Z9181 History of falling: Secondary | ICD-10-CM | POA: Insufficient documentation

## 2014-03-25 ENCOUNTER — Other Ambulatory Visit: Payer: Self-pay | Admitting: *Deleted

## 2014-03-25 MED ORDER — FLUTICASONE-SALMETEROL 250-50 MCG/DOSE IN AEPB: INHALATION_SPRAY | RESPIRATORY_TRACT | Status: DC

## 2014-03-25 NOTE — Telephone Encounter (Signed)
Received fax from [place] regarding they will not cover Breo and need an alternative. Per Dr. Rosalie Gums to Advair Diskus One inhalation twice daily Faxed form back [facility] # [phone]

## 2014-04-01 ENCOUNTER — Encounter (HOSPITAL_COMMUNITY): Payer: Self-pay | Admitting: Internal Medicine

## 2014-04-03 ENCOUNTER — Other Ambulatory Visit: Payer: Self-pay | Admitting: Internal Medicine

## 2014-04-05 ENCOUNTER — Other Ambulatory Visit: Payer: Self-pay

## 2014-04-05 MED ORDER — ACCU-CHEK SOFTCLIX LANCET DEV MISC: Status: DC

## 2014-05-05 LAB — HM DIABETES EYE EXAM

## 2014-05-27 ENCOUNTER — Encounter: Payer: Self-pay | Admitting: *Deleted

## 2014-05-31 ENCOUNTER — Other Ambulatory Visit: Payer: Medicare Other

## 2014-05-31 DIAGNOSIS — I10 Essential (primary) hypertension: Secondary | ICD-10-CM

## 2014-05-31 DIAGNOSIS — E119 Type 2 diabetes mellitus without complications: Secondary | ICD-10-CM

## 2014-05-31 DIAGNOSIS — E7800 Pure hypercholesterolemia, unspecified: Secondary | ICD-10-CM

## 2014-06-01 LAB — COMPREHENSIVE METABOLIC PANEL
A/G RATIO: 1.9 (ref 1.1–2.5)
ALBUMIN: 4.3 g/dL (ref 3.5–4.8)
ALT: 15 IU/L (ref 0–32)
AST: 22 IU/L (ref 0–40)
Alkaline Phosphatase: 88 IU/L (ref 39–117)
BILIRUBIN TOTAL: 0.3 mg/dL (ref 0.0–1.2)
BUN / CREAT RATIO: 23 (ref 11–26)
BUN: 19 mg/dL (ref 8–27)
CO2: 24 mmol/L (ref 18–29)
Calcium: 9.8 mg/dL (ref 8.7–10.3)
Chloride: 105 mmol/L (ref 97–108)
Creatinine, Ser: 0.84 mg/dL (ref 0.57–1.00)
GFR calc non Af Amer: 68 mL/min/{1.73_m2} (ref >59)
GFR, EST AFRICAN AMERICAN: 78 mL/min/{1.73_m2} (ref >59)
GLUCOSE: 218 mg/dL — AB (ref 65–99)
Globulin, Total: 2.3 g/dL (ref 1.5–4.5)
Potassium: 4.6 mmol/L (ref 3.5–5.2)
Sodium: 145 mmol/L — ABNORMAL HIGH (ref 134–144)
TOTAL PROTEIN: 6.6 g/dL (ref 6.0–8.5)

## 2014-06-01 LAB — LIPID PANEL
CHOL/HDL RATIO: 3.5 ratio (ref 0.0–4.4)
CHOLESTEROL TOTAL: 193 mg/dL (ref 100–199)
HDL: 55 mg/dL (ref >39)
LDL CALC: 114 mg/dL — AB (ref 0–99)
Triglycerides: 118 mg/dL (ref 0–149)
VLDL CHOLESTEROL CAL: 24 mg/dL (ref 5–40)

## 2014-06-01 LAB — MICROALBUMIN, URINE: Microalbumin, Urine: 11.1 ug/mL (ref 0.0–17.0)

## 2014-06-01 LAB — HEMOGLOBIN A1C
Est. average glucose Bld gHb Est-mCnc: 160 mg/dL
Hgb A1c MFr Bld: 7.2 % — ABNORMAL HIGH (ref 4.8–5.6)

## 2014-06-02 ENCOUNTER — Ambulatory Visit (INDEPENDENT_AMBULATORY_CARE_PROVIDER_SITE_OTHER): Payer: Medicare Other | Admitting: Internal Medicine

## 2014-06-02 ENCOUNTER — Encounter: Payer: Self-pay | Admitting: Internal Medicine

## 2014-06-02 VITALS — BP 136/80 | HR 60 | Temp 97.4°F | Resp 10 | Ht 66.0 in | Wt 136.0 lb

## 2014-06-02 DIAGNOSIS — I482 Chronic atrial fibrillation, unspecified: Secondary | ICD-10-CM

## 2014-06-02 DIAGNOSIS — E7800 Pure hypercholesterolemia, unspecified: Secondary | ICD-10-CM

## 2014-06-02 DIAGNOSIS — R634 Abnormal weight loss: Secondary | ICD-10-CM

## 2014-06-02 DIAGNOSIS — I10 Essential (primary) hypertension: Secondary | ICD-10-CM

## 2014-06-02 DIAGNOSIS — R0602 Shortness of breath: Secondary | ICD-10-CM

## 2014-06-02 DIAGNOSIS — N3941 Urge incontinence: Secondary | ICD-10-CM

## 2014-06-02 DIAGNOSIS — Z789 Other specified health status: Secondary | ICD-10-CM

## 2014-06-02 DIAGNOSIS — E119 Type 2 diabetes mellitus without complications: Secondary | ICD-10-CM

## 2014-06-02 DIAGNOSIS — Z23 Encounter for immunization: Secondary | ICD-10-CM | POA: Insufficient documentation

## 2014-06-02 DIAGNOSIS — Z9189 Other specified personal risk factors, not elsewhere classified: Secondary | ICD-10-CM

## 2014-06-02 MED ORDER — LINAGLIPTIN-METFORMIN HCL 2.5-1000 MG PO TABS: ORAL_TABLET | ORAL | Status: DC

## 2014-06-02 NOTE — Progress Notes (Signed)
[name], female   DOB: 1935-11-07, 79 y.o.   MRN: [id number]     Facility  [facility]    Place of Service:   OFFICE   Allergies  Allergen Reactions  . Erythromycin Hives  . Penicillins Hives and Rash  . Sulfa Antibiotics Hives  . Sulfonamide Derivatives Hives and Rash  . Wool Alcohol [Lanolin Alcohol] Hives  . Neomycin-Bacitracin Zn-Polymyx Rash  . Nitrofurantoin Other (See Comments)    Unknown   . Nitrofurantoin Monohyd Macro Other (See Comments)    Unknown     Chief Complaint  Patient presents with  . Medical Management of Chronic Issues    3 month follow-up, discuss labs (copy printed). Discuss balance issues  (ongoing concern)   . Immunizations    Flu vaccine today, discuss the need for Prevnar vaccine     HPI:   Diabetes type 2, controlled - last A1c was 7.2. CBG on lab was 218, but she was not fasting. Checking CBG twice daily at home. On most days, her afternoon CBG is much higher than the morning CBG.  Chronic atrial fibrillation: Controlled rate. Anticoagulated with Xarelto.  DYSPNEA: Dyspnea mostly with exertion. Has been seen by Dr. Caryl Comes who believes that this is a multifactorial issue including problems with COPD and deconditioning.  Essential hypertension: Controlled  Hypercholesterolemia: Controlled  Weight loss, non-intentional: Continues to lose weight. Appetite is not good. Has lost about 12 pounds in the last 13 months.  Urge incontinence: Continues to be incontinent. She says she gets an urge to urinate as soon as she stands up. She cannot control this. She has been to see Dr. Matilde Sprang, who is unable to help her. She uses incontinence pads.    Medications:  Did not have a fasting blood specimen on the most recent lab Patient's Medications  New Prescriptions   No medications on file  Previous Medications   ACCU-CHEK FASTCLIX LANCETS MISC    USE TO CHECK BLOOD SUGAR THREE TIMES A DAY TO CONTROL DIABETES   ACCU-CHEK SMARTVIEW TEST  STRIP    USE TO CHECK BLOOD SUGAR TWICE A DAY   CHOLECALCIFEROL (VITAMIN D PO)    Take 2,000 Units by mouth daily.   FLAXSEED, LINSEED, (FLAX SEED OIL PO)    Take 1,400 mg by mouth daily.    FLUTICASONE-SALMETEROL (ADVAIR DISKUS) 250-50 MCG/DOSE AEPB    One inhalation twice daily for breathing   LANCET DEVICES (ACCU-CHEK SOFTCLIX) LANCETS    Use once daily as instructed to check blood sugar DX E11.9   LINAGLIPTIN (TRADJENTA) 5 MG TABS TABLET    Take one tablet by mouth once daily for diabetes   MULTIPLE VITAMINS-MINERALS (CENTRUM SILVER PO)    Take 1 tablet by mouth daily.    MULTIPLE VITAMINS-MINERALS (HAIR/SKIN/NAILS) TABS    Take 2 tablets by mouth daily.    OMEGA-3 FATTY ACIDS (FISH OIL PO)    Take 1 capsule by mouth daily. 1220 mg/360 Omega 3   RIVAROXABAN (XARELTO) 20 MG TABS TABLET    Take 20 mg by mouth daily with supper.  Modified Medications   No medications on file  Discontinued Medications   No medications on file     Review of Systems  Constitutional: Positive for fatigue. Negative for fever, chills, diaphoresis, activity change, appetite change and unexpected weight change.  HENT: Negative for congestion, ear discharge, ear pain, hearing loss, postnasal drip, rhinorrhea, sore throat, tinnitus, trouble swallowing and voice change.   Eyes: Negative for pain,  redness, itching and visual disturbance.  Respiratory: Positive for cough and shortness of breath. Negative for choking and wheezing.   Cardiovascular: Negative for chest pain, palpitations and leg swelling.  Gastrointestinal: Negative for nausea, abdominal pain, diarrhea, constipation and abdominal distention.  Endocrine: Negative for cold intolerance, heat intolerance, polydipsia, polyphagia and polyuria.  Genitourinary: Positive for urgency (with incontinence). Negative for dysuria, frequency, hematuria, flank pain, vaginal discharge, difficulty urinating and pelvic pain.  Musculoskeletal: Positive for gait problem.  Negative for myalgias, back pain, arthralgias, neck pain and neck stiffness.  Skin: Negative for color change, pallor and rash.  Allergic/Immunologic: Negative.   Neurological: Positive for tremors. Negative for dizziness, seizures, syncope, weakness, numbness and headaches.  Hematological: Negative for adenopathy. Does not bruise/bleed easily.       Anticoagulated with Xarelto due to chronic AF.  Psychiatric/Behavioral: Positive for sleep disturbance. Negative for suicidal ideas, hallucinations, behavioral problems, confusion, dysphoric mood and agitation. The patient is nervous/anxious.     Filed Vitals:   06/02/14 1410  BP: 136/80  Pulse: 60  Temp: 97.4 F (36.3 C)  TempSrc: Oral  Resp: 10  Height: 5\' 6"  (1.676 m)  Weight: 136 lb (61.689 kg)  SpO2: 99%   Body mass index is 21.96 kg/(m^2).  Physical Exam  Constitutional: She is oriented to person, place, and time. She appears well-developed and well-nourished. No distress.  frail  HENT:  Right Ear: External ear normal.  Left Ear: External ear normal.  Nose: Nose normal.  Mouth/Throat: Oropharynx is clear and moist. No oropharyngeal exudate.  Eyes: Conjunctivae and EOM are normal. Pupils are equal, round, and reactive to light. No scleral icterus.  Neck: No JVD present. No tracheal deviation present. No thyromegaly present.  Cardiovascular: Normal rate, normal heart sounds and intact distal pulses.  Exam reveals no gallop and no friction rub.   No murmur heard. AF  Pulmonary/Chest: Effort normal. No respiratory distress. She has no wheezes. She has no rales. She exhibits no tenderness.  Pacemaker in the left upper chest.  Abdominal: She exhibits no distension and no mass. There is no tenderness.  Musculoskeletal: Normal range of motion. She exhibits no edema or tenderness.  Unstable gait. Using walker.  Lymphadenopathy:    She has no cervical adenopathy.  Neurological: She is alert and oriented to person, place, and time.  No cranial nerve deficit. Coordination normal.  Skin: No rash noted. She is not diaphoretic. No erythema. No pallor.  Psychiatric: She has a normal mood and affect. Her behavior is normal. Judgment and thought content normal.     Labs reviewed: Appointment on 05/31/2014  Component Date Value Ref Range Status  . Hgb A1c MFr Bld 05/31/2014 7.2* 4.8 - 5.6 % Final   Comment:          Pre-diabetes: 5.7 - 6.4          Diabetes: >6.4          Glycemic control for adults with diabetes: <7.0   . Est. average glucose Bld gHb Est-m* 05/31/2014 160   Final  . Glucose 05/31/2014 218* 65 - 99 mg/dL Final  . BUN 05/31/2014 19  8 - 27 mg/dL Final  . Creatinine, Ser 05/31/2014 0.84  0.57 - 1.00 mg/dL Final  . GFR calc non Af Amer 05/31/2014 68  >59 mL/min/1.73 Final  . GFR calc Af Amer 05/31/2014 78  >59 mL/min/1.73 Final  . BUN/Creatinine Ratio 05/31/2014 23  11 - 26 Final  . Sodium 05/31/2014 145* 134 - 144  mmol/L Final  . Potassium 05/31/2014 4.6  3.5 - 5.2 mmol/L Final  . Chloride 05/31/2014 105  97 - 108 mmol/L Final  . CO2 05/31/2014 24  18 - 29 mmol/L Final  . Calcium 05/31/2014 9.8  8.7 - 10.3 mg/dL Final  . Total Protein 05/31/2014 6.6  6.0 - 8.5 g/dL Final  . Albumin 05/31/2014 4.3  3.5 - 4.8 g/dL Final  . Globulin, Total 05/31/2014 2.3  1.5 - 4.5 g/dL Final  . Albumin/Globulin Ratio 05/31/2014 1.9  1.1 - 2.5 Final  . BILIRUBIN TOTAL 05/31/2014 0.3  0.0 - 1.2 mg/dL Final  . Alkaline Phosphatase 05/31/2014 88  39 - 117 IU/L Final  . AST 05/31/2014 22  0 - 40 IU/L Final  . ALT 05/31/2014 15  0 - 32 IU/L Final  . Cholesterol, Total 05/31/2014 193  100 - 199 mg/dL Final  . Triglycerides 05/31/2014 118  0 - 149 mg/dL Final  . HDL 05/31/2014 55  >39 mg/dL Final   Comment: According to ATP-III Guidelines, HDL-C >59 mg/dL is considered a negative risk factor for CHD.   Marland Kitchen VLDL Cholesterol Cal 05/31/2014 24  5 - 40 mg/dL Final  . LDL Calculated 05/31/2014 114* 0 - 99 mg/dL Final  . Chol/HDL  Ratio 05/31/2014 3.5  0.0 - 4.4 ratio units Final   Comment:                                   T. Chol/HDL Ratio                                             Men  Women                               1/2 Avg.Risk  3.4    3.3                                   Avg.Risk  5.0    4.4                                2X Avg.Risk  9.6    7.1                                3X Avg.Risk 23.4   11.0   . Microalbum.,U,Random 05/31/2014 11.1  0.0 - 17.0 ug/mL Final  Abstract on 05/27/2014  Component Date Value Ref Range Status  . HM Diabetic Eye Exam 05/05/2014 No Retinopathy  No Retinopathy Final   [facility]-Dr. Valetta Close [phone]     Assessment/Plan  1. Encounter for immunization Administered Prevnar today  2. Diabetes type 2, controlled Because of persistently high blood sugars we have changed her medications.-Discontinue linagliptan and start combination of linagliptan plus metformin  - Linagliptin-Metformin HCl 2.08-998 MG TABS; One each morning to control diabetes  Dispense: 90 tablet; Refill: 4 - Hemoglobin A1c; Future - Basic metabolic panel; Future  3. Chronic atrial fibrillation Right controlled. No pains.   4. DYSPNEA Likely related to deconditioning and COPD.   5. Essential  hypertension Controlled   6. Hypercholesterolemia Controlled   7. Streptococcus pneumoniae vaccination indicated Prevnar administered  8. Weight loss, non-intentional Encouraged supplements   9. Urge incontinence Advised her I thought she was going to have to live with this problem.   10. Need for prophylactic vaccination against Streptococcus pneumoniae (pneumococcus) - Pneumococcal conjugate vaccine 13-valent

## 2014-06-02 NOTE — Patient Instructions (Signed)
Call in 2 weeks and tell us how you are doing on the new diabetes medicine. We should call in your prescription if you are tolerating it.

## 2014-06-10 ENCOUNTER — Telehealth: Payer: Self-pay | Admitting: *Deleted

## 2014-06-10 DIAGNOSIS — E119 Type 2 diabetes mellitus without complications: Secondary | ICD-10-CM

## 2014-06-10 MED ORDER — LINAGLIPTIN-METFORMIN HCL 2.5-1000 MG PO TABS: ORAL_TABLET | ORAL | Status: AC

## 2014-06-10 NOTE — Telephone Encounter (Signed)
Patient called and wanted to let you know the Linagliptin-Metformin is working. Wanted Rx faxed to [facility]. I faxed it in.

## 2014-06-11 NOTE — Telephone Encounter (Signed)
Thank you :)

## 2014-06-15 ENCOUNTER — Encounter: Payer: Self-pay | Admitting: *Deleted

## 2014-06-21 ENCOUNTER — Telehealth: Payer: Self-pay | Admitting: *Deleted

## 2014-06-21 ENCOUNTER — Ambulatory Visit: Payer: Medicare Other | Admitting: Neurology

## 2014-06-21 MED ORDER — MECLIZINE HCL 25 MG PO TABS: ORAL_TABLET | ORAL | Status: DC

## 2014-06-21 NOTE — Telephone Encounter (Signed)
Try meclizine 25 mg (30) 1 every 6 hours if needed for dizziness. Call back if not better in 48 hours

## 2014-06-21 NOTE — Telephone Encounter (Signed)
Patient Notified and agreed. Rx faxed to pharmacy

## 2014-06-21 NOTE — Telephone Encounter (Signed)
Patient called and stated that she is dizzier than normal. Is falling into stuff. Her balance is off. Patient states her ears itch. Doesn't hurt. Has had something for this in the past and would like something called in. Please Advise.

## 2014-06-28 NOTE — Telephone Encounter (Signed)
Patient called and wanted to know if she should continue on the Meclizine. It does help with the dizziness. If so she would like a Rx called into [facility]. Please Advise.

## 2014-06-29 MED ORDER — MECLIZINE HCL 25 MG PO TABS: ORAL_TABLET | ORAL | Status: AC

## 2014-06-29 NOTE — Addendum Note (Signed)
Addended by: Rafael Bihari A on: 06/29/2014 04:09 PM   Modules accepted: Orders

## 2014-06-29 NOTE — Telephone Encounter (Signed)
Patient notified and expressed to patient to just take as needed. Agreed. Faxed to [facility].

## 2014-06-29 NOTE — Telephone Encounter (Signed)
continue meclizine. Prescribed: Meclizine 25 mg Dispense 50 Sig: One every 6 hours if needed for dizziness Refills: 5

## 2014-06-30 ENCOUNTER — Ambulatory Visit (INDEPENDENT_AMBULATORY_CARE_PROVIDER_SITE_OTHER): Payer: Medicare Other | Admitting: *Deleted

## 2014-06-30 DIAGNOSIS — I442 Atrioventricular block, complete: Secondary | ICD-10-CM

## 2014-06-30 LAB — MDC_IDC_ENUM_SESS_TYPE_INCLINIC
Battery Remaining Longevity: 85 mo
Brady Statistic RV Percent Paced: 78 %
Lead Channel Impedance Value: 0 Ohm
Lead Channel Impedance Value: 651 Ohm
Lead Channel Pacing Threshold Pulse Width: 0.4 ms
Lead Channel Sensing Intrinsic Amplitude: 8 mV
Lead Channel Setting Pacing Amplitude: 2.5 V
MDC IDC MSMT BATTERY IMPEDANCE: 447 Ohm
MDC IDC MSMT BATTERY VOLTAGE: 2.78 V
MDC IDC MSMT LEADCHNL RV PACING THRESHOLD AMPLITUDE: 0.5 V
MDC IDC SESS DTM: 20160309152015
MDC IDC SET LEADCHNL RV PACING PULSEWIDTH: 0.4 ms
MDC IDC SET LEADCHNL RV SENSING SENSITIVITY: 4 mV

## 2014-06-30 NOTE — Progress Notes (Signed)
Pacemaker check in clinic. Normal device function. Thresholds, sensing, impedances consistent with previous measurements. Device programmed to maximize longevity. No high ventricular rates noted. Device programmed at appropriate safety margins. Histogram distribution appropriate for patient activity level. Device programmed to optimize intrinsic conduction. Estimated longevity 7 years. ROV in June

## 2014-07-21 ENCOUNTER — Encounter: Payer: Self-pay | Admitting: Internal Medicine

## 2014-08-10 ENCOUNTER — Encounter: Payer: Self-pay | Admitting: Internal Medicine

## 2014-08-10 ENCOUNTER — Ambulatory Visit (INDEPENDENT_AMBULATORY_CARE_PROVIDER_SITE_OTHER): Payer: Medicare Other | Admitting: Internal Medicine

## 2014-08-10 VITALS — BP 140/78 | HR 93 | Temp 97.4°F | Resp 18 | Ht 66.0 in | Wt 137.4 lb

## 2014-08-10 DIAGNOSIS — R29818 Other symptoms and signs involving the nervous system: Secondary | ICD-10-CM | POA: Diagnosis not present

## 2014-08-10 DIAGNOSIS — K117 Disturbances of salivary secretion: Secondary | ICD-10-CM | POA: Diagnosis not present

## 2014-08-10 DIAGNOSIS — I509 Heart failure, unspecified: Secondary | ICD-10-CM

## 2014-08-10 DIAGNOSIS — R531 Weakness: Secondary | ICD-10-CM | POA: Diagnosis not present

## 2014-08-10 DIAGNOSIS — I10 Essential (primary) hypertension: Secondary | ICD-10-CM

## 2014-08-10 DIAGNOSIS — R2689 Other abnormalities of gait and mobility: Secondary | ICD-10-CM | POA: Insufficient documentation

## 2014-08-10 DIAGNOSIS — R269 Unspecified abnormalities of gait and mobility: Secondary | ICD-10-CM | POA: Diagnosis not present

## 2014-08-10 DIAGNOSIS — N3941 Urge incontinence: Secondary | ICD-10-CM | POA: Diagnosis not present

## 2014-08-10 DIAGNOSIS — Z9181 History of falling: Secondary | ICD-10-CM | POA: Diagnosis not present

## 2014-08-10 DIAGNOSIS — R682 Dry mouth, unspecified: Secondary | ICD-10-CM

## 2014-08-10 DIAGNOSIS — E119 Type 2 diabetes mellitus without complications: Secondary | ICD-10-CM

## 2014-08-10 MED ORDER — PILOCARPINE HCL 5 MG PO TABS: ORAL_TABLET | ORAL | Status: DC

## 2014-08-10 NOTE — Patient Instructions (Signed)
It is not safe for you to drive anymore. Do not drive.

## 2014-08-10 NOTE — Progress Notes (Signed)
[name], female   DOB: Nov 19, 1935, 79 y.o.   MRN: [id number]    Facility  [facility]    Place of Service:   OFFICE    Allergies  Allergen Reactions  . Erythromycin Hives  . Penicillins Hives and Rash  . Sulfa Antibiotics Hives  . Sulfonamide Derivatives Hives and Rash  . Wool Alcohol [Lanolin Alcohol] Hives  . Neomycin-Bacitracin Zn-Polymyx Rash  . Nitrofurantoin Other (See Comments)    Unknown   . Nitrofurantoin Monohyd Macro Other (See Comments)    Unknown     Chief Complaint  Patient presents with  . Acute Visit    fell 08/02/14    HPI:  [address] in hall at her home and could not get up. Had rug burn on the right knee. No syncope. EMT called. Found by her neighbor BOB, who she called on a cell phone. She always carries the phone. Previously had Lifeline, but does not use it anymore.  Dyspnea. Using Advair regularly. Sits in chair if SOB. Recovers in about 10 minutes. No dyspnea at night.  Nocturia x 4-5.  Home CBG 110- 139 mg %  Essential hypertension: Controlled  Diabetes type 2, controlled: Controlled  Congestive heart failure, unspecified congestive heart failure chronicity, unspecified congestive heart failure type: Compensated  Abnormality of gait: Unstable. Slow reflexes. Using a walker.  Balance disorder: Unstable on standing and sitting  Urge incontinence: Persistent and unchanged  Xerostomia: Has become a very uncomfortable problem. She is not currently on medications that I would identify as a problem creating drug.      Medications: Patient's Medications  New Prescriptions   No medications on file  Previous Medications   ACCU-CHEK FASTCLIX LANCETS MISC    USE TO CHECK BLOOD SUGAR THREE TIMES A DAY TO CONTROL DIABETES   ACCU-CHEK SMARTVIEW TEST STRIP    USE TO CHECK BLOOD SUGAR TWICE A DAY   CHOLECALCIFEROL (VITAMIN D PO)    Take 2,000 Units by mouth daily.   FLAXSEED, LINSEED, (FLAX SEED OIL PO)    Take 1,400 mg by mouth daily.    FLUTICASONE-SALMETEROL (ADVAIR DISKUS) 250-50 MCG/DOSE AEPB    One inhalation twice daily for breathing   LANCET DEVICES (ACCU-CHEK SOFTCLIX) LANCETS    Use once daily as instructed to check blood sugar DX E11.9   LINAGLIPTIN-METFORMIN HCL 2.08-998 MG TABS    One each morning to control diabetes   MECLIZINE (ANTIVERT) 25 MG TABLET    Take one tablet by mouth every 6 hours as needed for dizziness   MULTIPLE VITAMINS-MINERALS (CENTRUM SILVER PO)    Take 1 tablet by mouth daily.    MULTIPLE VITAMINS-MINERALS (HAIR/SKIN/NAILS) TABS    Take 2 tablets by mouth daily.    OMEGA-3 FATTY ACIDS (FISH OIL PO)    Take 1 capsule by mouth daily. 1220 mg/360 Omega 3   RIVAROXABAN (XARELTO) 20 MG TABS TABLET    Take 20 mg by mouth daily with supper.  Modified Medications   No medications on file  Discontinued Medications   No medications on file     Review of Systems  Constitutional: Positive for fatigue. Negative for fever, chills, diaphoresis, activity change, appetite change and unexpected weight change.  HENT: Negative for congestion, ear discharge, ear pain, hearing loss, postnasal drip, rhinorrhea, sore throat, tinnitus, trouble swallowing and voice change.   Eyes: Negative for pain, redness, itching and visual disturbance.  Respiratory: Positive for cough and shortness of breath. Negative for choking and wheezing.  Cardiovascular: Negative for chest pain, palpitations and leg swelling.  Gastrointestinal: Negative for nausea, abdominal pain, diarrhea, constipation and abdominal distention.  Endocrine: Negative for cold intolerance, heat intolerance, polydipsia, polyphagia and polyuria.  Genitourinary: Positive for urgency (with incontinence). Negative for dysuria, frequency, hematuria, flank pain, vaginal discharge, difficulty urinating and pelvic pain.  Musculoskeletal: Positive for gait problem. Negative for myalgias, back pain, arthralgias, neck pain and neck stiffness.  Skin: Negative for color  change, pallor and rash.  Allergic/Immunologic: Negative.   Neurological: Positive for tremors. Negative for dizziness, seizures, syncope, weakness, numbness and headaches.  Hematological: Negative for adenopathy. Does not bruise/bleed easily.       Anticoagulated with Xarelto due to chronic AF.  Psychiatric/Behavioral: Positive for sleep disturbance. Negative for suicidal ideas, hallucinations, behavioral problems, confusion, dysphoric mood and agitation. The patient is nervous/anxious.     Filed Vitals:   08/10/14 1437  BP: 140/78  Pulse: 93  Temp: 97.4 F (36.3 C)  TempSrc: Oral  Resp: 18  Height: 5\' 6"  (1.676 m)  Weight: 137 lb 6.4 oz (62.324 kg)  SpO2: 98%   Body mass index is 22.19 kg/(m^2).  Physical Exam  Constitutional: She is oriented to person, place, and time. She appears well-developed and well-nourished. No distress.  frail  HENT:  Right Ear: External ear normal.  Left Ear: External ear normal.  Nose: Nose normal.  Mouth/Throat: Oropharynx is clear and moist. No oropharyngeal exudate.  Eyes: Conjunctivae and EOM are normal. Pupils are equal, round, and reactive to light. No scleral icterus.  Neck: No JVD present. No tracheal deviation present. No thyromegaly present.  Cardiovascular: Normal rate, normal heart sounds and intact distal pulses.  Exam reveals no gallop and no friction rub.   No murmur heard. AF  Pulmonary/Chest: Effort normal. No respiratory distress. She has no wheezes. She has no rales. She exhibits no tenderness.  Pacemaker in the left upper chest.  Abdominal: She exhibits no distension and no mass. There is no tenderness.  Musculoskeletal: Normal range of motion. She exhibits no edema or tenderness.  Unstable gait. Using walker.  Lymphadenopathy:    She has no cervical adenopathy.  Neurological: She is alert and oriented to person, place, and time. No cranial nerve deficit. Coordination abnormal.  Skin: No rash noted. She is not diaphoretic.  No erythema. No pallor.  Psychiatric: She has a normal mood and affect. Her behavior is normal. Judgment and thought content normal.     Labs reviewed: Clinical Support on 06/30/2014  Component Date Value Ref Range Status  . Date Time Interrogation Session 06/30/2014 [id number]   Final  . Implantable Pulse Generator Manufa* 06/30/2014 Medtronic   Final  . Implantable Pulse Generator Model 06/30/2014 ADSR01 Adapta   Final  . Implantable Pulse Generator Serial* 06/30/2014 [id number]   Final  . Lead Channel Setting Sensing Sensi* 06/30/2014 4.00   Final  . Lead Channel Setting Pacing Pulse * 06/30/2014 0.40   Final  . Lead Channel Setting Pacing Amplit* 06/30/2014 2.500   Final  . Lead Channel Impedance Value 06/30/2014 0   Final  . Lead Channel Impedance Value 06/30/2014 651   Final  . Lead Channel Sensing Intrinsic Amp* 06/30/2014 8.00   Final  . Lead Channel Pacing Threshold Ampl* 06/30/2014 0.500   Final  . Lead Channel Pacing Threshold Puls* 06/30/2014 0.40   Final  . Battery Status 06/30/2014 Unknown   Final  . Battery Remaining Longevity 06/30/2014 85   Final  . Battery Voltage 06/30/2014 2.78  Final  . Battery Impedance 06/30/2014 447   Final  . Loletha Grayer Statistic RV Percent Paced 06/30/2014 78   Final  . Eval Rhythm 06/30/2014 Vs @32    Final  . Miscellaneous Comment 06/30/2014    Final                   Value:Pacemaker check in clinic. Normal device function. Thresholds, sensing, impedances consistent with previous measurements. Device programmed to maximize longevity. No high ventricular rates noted. Device programmed at appropriate safety margins. Histogram  distribution appropriate for patient activity level. Device programmed to optimize intrinsic conduction. Estimated longevity 7 years. ROV in June with Dr. Caryl Comes.   Appointment on 05/31/2014  Component Date Value Ref Range Status  . Hgb A1c MFr Bld 05/31/2014 7.2* 4.8 - 5.6 % Final   Comment:          Pre-diabetes: 5.7 -  6.4          Diabetes: >6.4          Glycemic control for adults with diabetes: <7.0   . Est. average glucose Bld gHb Est-m* 05/31/2014 160   Final  . Glucose 05/31/2014 218* 65 - 99 mg/dL Final  . BUN 05/31/2014 19  8 - 27 mg/dL Final  . Creatinine, Ser 05/31/2014 0.84  0.57 - 1.00 mg/dL Final  . GFR calc non Af Amer 05/31/2014 68  >59 mL/min/1.73 Final  . GFR calc Af Amer 05/31/2014 78  >59 mL/min/1.73 Final  . BUN/Creatinine Ratio 05/31/2014 23  11 - 26 Final  . Sodium 05/31/2014 145* 134 - 144 mmol/L Final  . Potassium 05/31/2014 4.6  3.5 - 5.2 mmol/L Final  . Chloride 05/31/2014 105  97 - 108 mmol/L Final  . CO2 05/31/2014 24  18 - 29 mmol/L Final  . Calcium 05/31/2014 9.8  8.7 - 10.3 mg/dL Final  . Total Protein 05/31/2014 6.6  6.0 - 8.5 g/dL Final  . Albumin 05/31/2014 4.3  3.5 - 4.8 g/dL Final  . Globulin, Total 05/31/2014 2.3  1.5 - 4.5 g/dL Final  . Albumin/Globulin Ratio 05/31/2014 1.9  1.1 - 2.5 Final  . Bilirubin Total 05/31/2014 0.3  0.0 - 1.2 mg/dL Final  . Alkaline Phosphatase 05/31/2014 88  39 - 117 IU/L Final  . AST 05/31/2014 22  0 - 40 IU/L Final  . ALT 05/31/2014 15  0 - 32 IU/L Final  . Cholesterol, Total 05/31/2014 193  100 - 199 mg/dL Final  . Triglycerides 05/31/2014 118  0 - 149 mg/dL Final  . HDL 05/31/2014 55  >39 mg/dL Final   Comment: According to ATP-III Guidelines, HDL-C >59 mg/dL is considered a negative risk factor for CHD.   Marland Kitchen VLDL Cholesterol Cal 05/31/2014 24  5 - 40 mg/dL Final  . LDL Calculated 05/31/2014 114* 0 - 99 mg/dL Final  . Chol/HDL Ratio 05/31/2014 3.5  0.0 - 4.4 ratio units Final   Comment:                                   T. Chol/HDL Ratio                                             Men  Women  1/2 Avg.Risk  3.4    3.3                                   Avg.Risk  5.0    4.4                                2X Avg.Risk  9.6    7.1                                3X Avg.Risk 23.4   11.0   .  Microalbum.,U,Random 05/31/2014 11.1  0.0 - 17.0 ug/mL Final  Abstract on 05/27/2014  Component Date Value Ref Range Status  . HM Diabetic Eye Exam 05/05/2014 No Retinopathy  No Retinopathy Final   [facility]-Dr. Valetta Close [phone]     Assessment/Plan  1. History of fall  2. Essential hypertension controlled  3. Diabetes type 2, controlled  4. Congestive heart failure, unspecified congestive heart failure chronicity, unspecified congestive heart failure type compensated  5. Weak - Ambulatory referral to Physical Therapy  6. Abnormality of gait - Ambulatory referral to Physical Therapy  7. Balance disorder - Ambulatory referral to Physical Therapy  8. Urge incontinence No change in medications  9. Xerostomia - try Salagen 5mg  tid

## 2014-08-13 IMAGING — CT CT HEAD W/O CM
1 series · 15 of 30 positions shown, 19 images · non-contrast
Comparison: CT of the head performed 08/01/2012

CLINICAL DATA: Status post fall; large left forehead hematoma.

EXAM:
CT HEAD WITHOUT CONTRAST
TECHNIQUE: Contiguous axial images were obtained from the base of the skull
through the vertex without intravenous contrast.

[Series 2: head 5.0 h30s · axial · 0.43mm/px · z∈[-153,-13]mm · 15 of 32 slices shown, 19 images]
[im 2/32  brain]
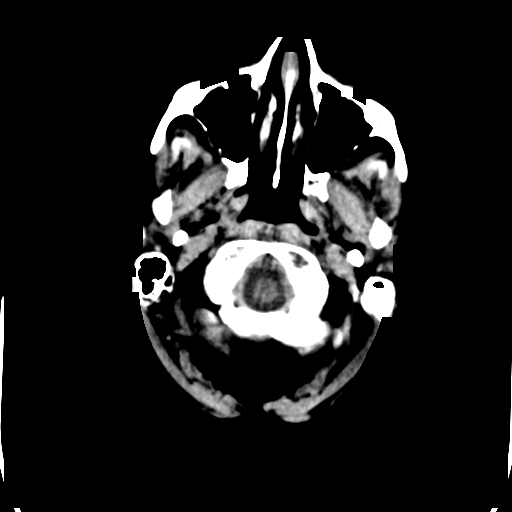
[im 2/32  bone]
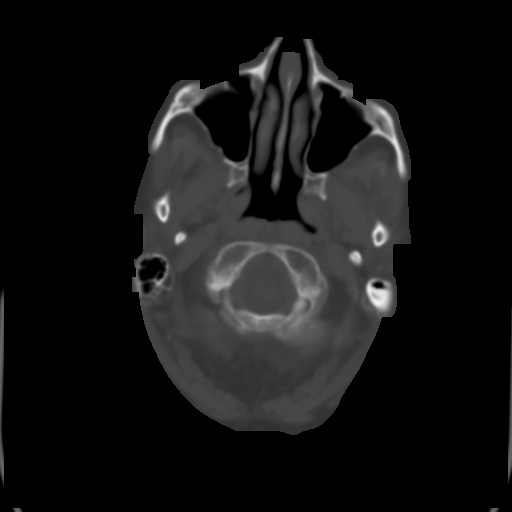
[im 4/32  brain]
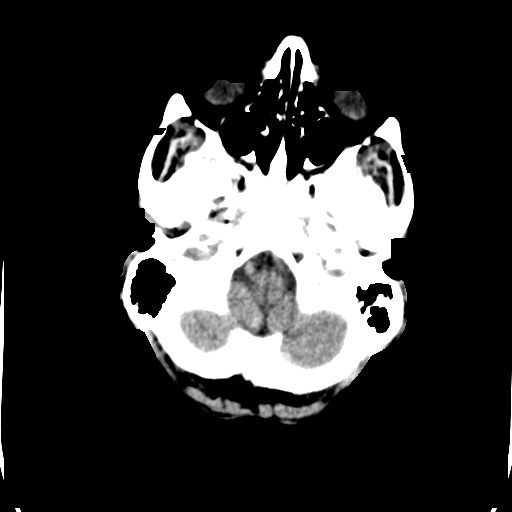
[im 6/32  brain]
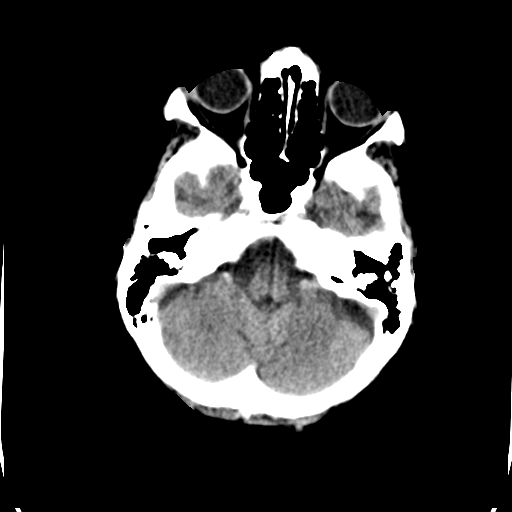
[im 8/32  brain]
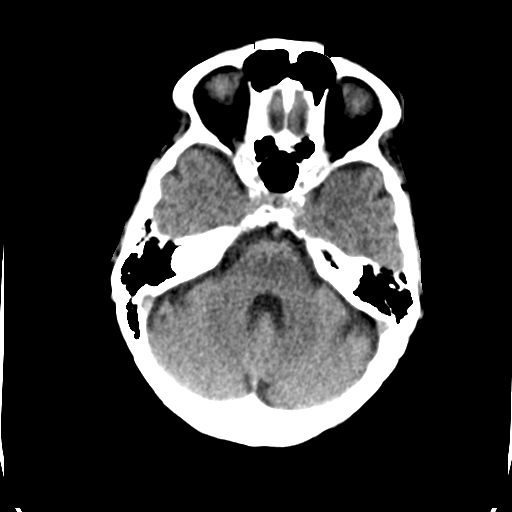
[im 10/32  brain]
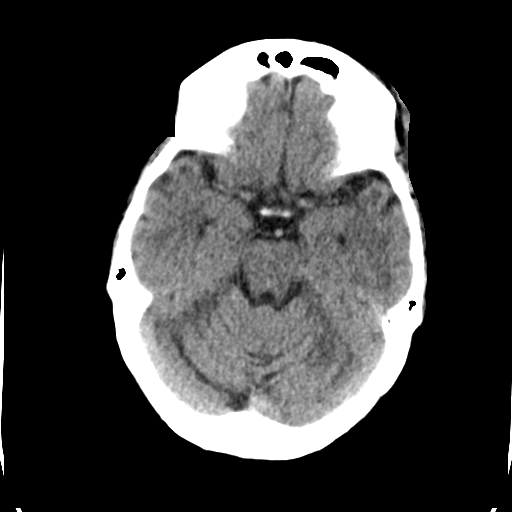
[im 10/32  bone]
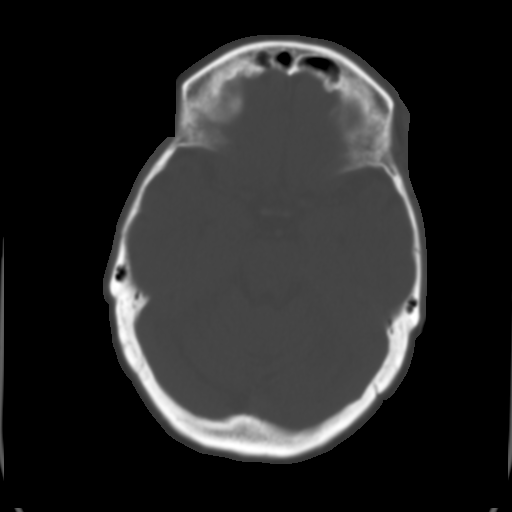
[im 12/32  brain]
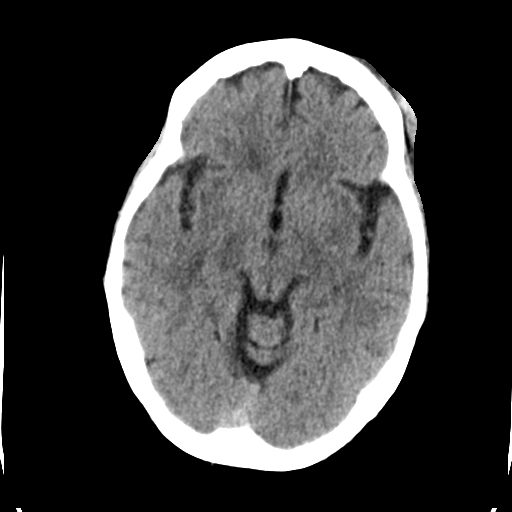
[im 14/32  brain]
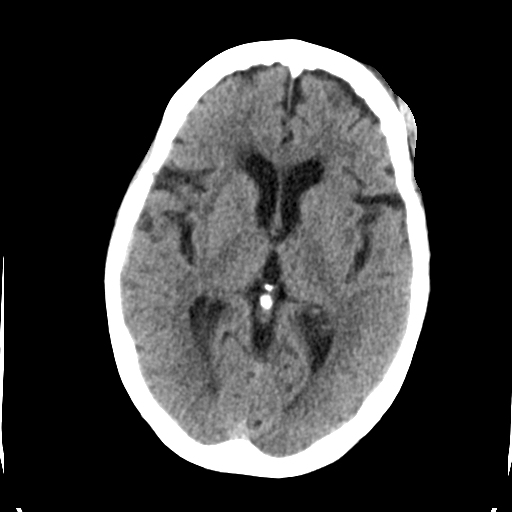
[im 17/32  brain]
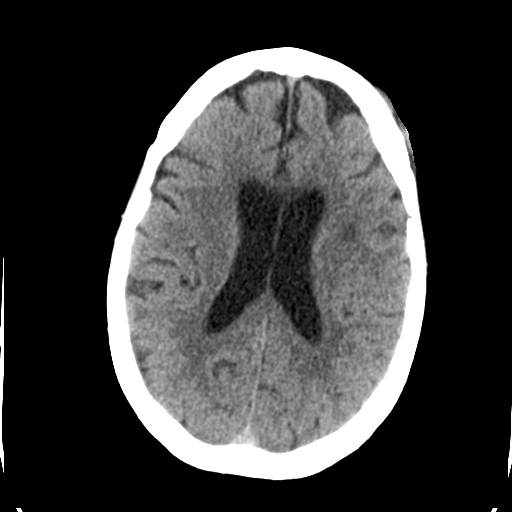
[im 18/32  brain]
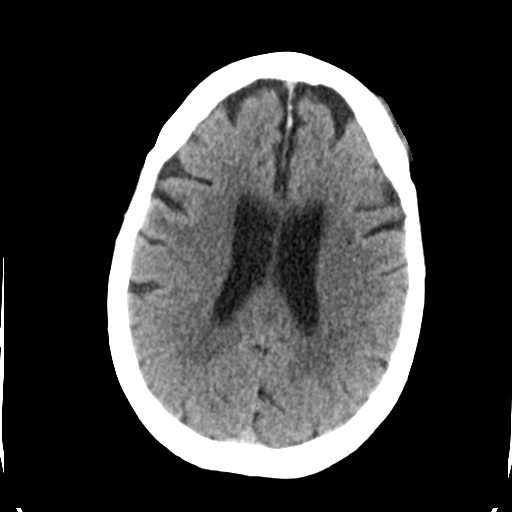
[im 18/32  bone]
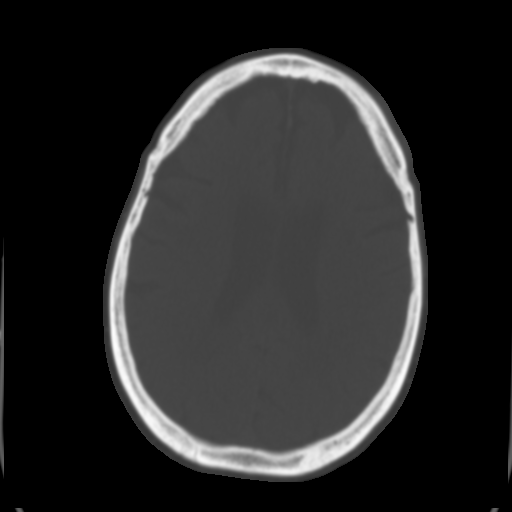
[im 20/32  brain]
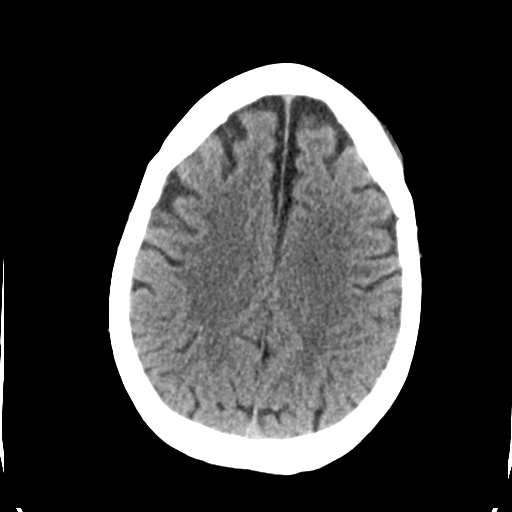
[im 22/32  brain]
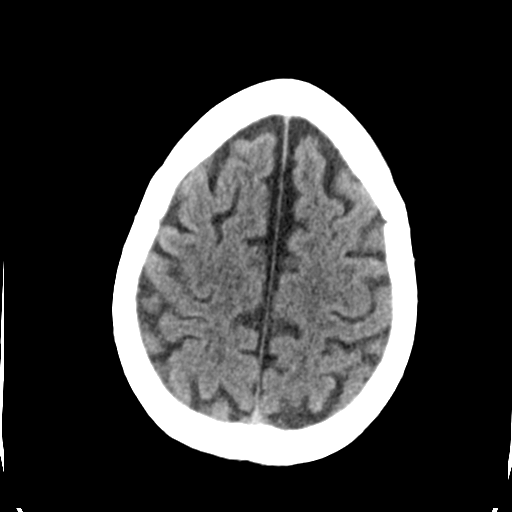
[im 24/32  brain]
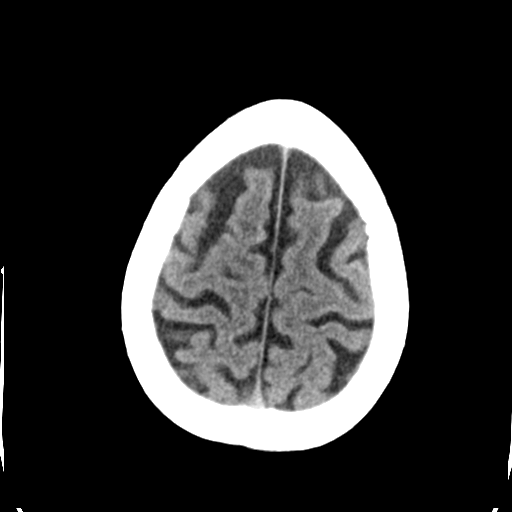
[im 26/32  brain]
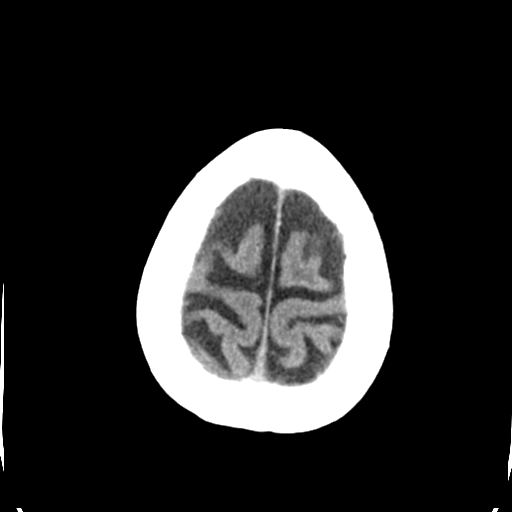
[im 26/32  bone]
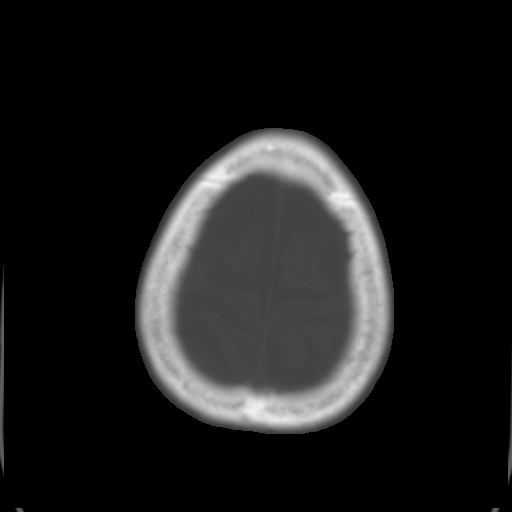
[im 28/32  brain]
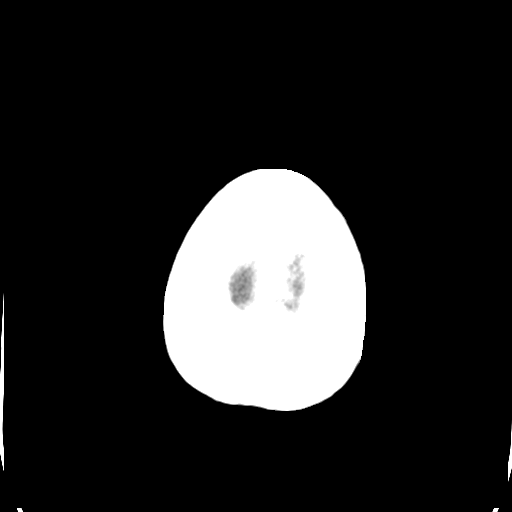
[im 30/32  brain]
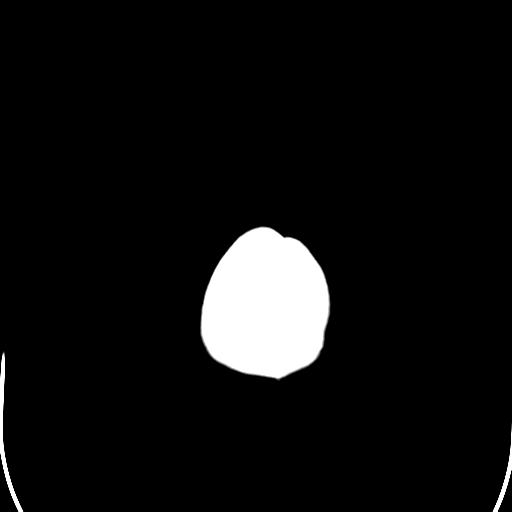

[15 of 30 positions shown; findings below may reference images not displayed]

FINDINGS: There is no evidence of acute infarction, mass lesion, or intra- or
extra-axial hemorrhage on CT.

Scattered periventricular and subcortical white matter change
reflects mild cortical volume loss. Mild cerebellar atrophy is
noted. Scattered periventricular white matter change likely reflects
small vessel ischemic microangiopathy.

The brainstem and fourth ventricle are within normal limits. The
basal ganglia are unremarkable in appearance. The cerebral
hemispheres demonstrate grossly normal gray-white differentiation.
No mass effect or midline shift is seen.

There is no evidence of fracture; visualized osseous structures are
unremarkable in appearance. The orbits are within normal limits. The
paranasal sinuses and mastoid air cells are well-aerated. A
prominent scalp hematoma is noted overlying the left frontal
calvarium.
IMPRESSION: 1. No evidence of traumatic intracranial injury or fracture.
2. Prominent scalp hematoma noted overlying the left frontal
calvarium.
3. Mild cortical volume loss and scattered small vessel ischemic
microangiopathy.

## 2014-08-30 ENCOUNTER — Other Ambulatory Visit: Payer: Self-pay | Admitting: *Deleted

## 2014-08-30 MED ORDER — GLUCOSE BLOOD VI STRP: ORAL_STRIP | Status: DC

## 2014-08-30 NOTE — Telephone Encounter (Signed)
Patient requested and faxed to pharmacy 

## 2014-09-01 ENCOUNTER — Telehealth: Payer: Self-pay

## 2014-09-01 ENCOUNTER — Encounter: Payer: Self-pay | Admitting: Internal Medicine

## 2014-09-01 ENCOUNTER — Ambulatory Visit (INDEPENDENT_AMBULATORY_CARE_PROVIDER_SITE_OTHER): Payer: Medicare Other | Admitting: Internal Medicine

## 2014-09-01 VITALS — BP 146/84 | HR 79 | Temp 97.6°F | Ht 66.0 in | Wt 138.6 lb

## 2014-09-01 DIAGNOSIS — R29818 Other symptoms and signs involving the nervous system: Secondary | ICD-10-CM | POA: Diagnosis not present

## 2014-09-01 DIAGNOSIS — R0602 Shortness of breath: Secondary | ICD-10-CM

## 2014-09-01 DIAGNOSIS — R682 Dry mouth, unspecified: Secondary | ICD-10-CM

## 2014-09-01 DIAGNOSIS — Z9181 History of falling: Secondary | ICD-10-CM | POA: Diagnosis not present

## 2014-09-01 DIAGNOSIS — K117 Disturbances of salivary secretion: Secondary | ICD-10-CM | POA: Diagnosis not present

## 2014-09-01 DIAGNOSIS — R2689 Other abnormalities of gait and mobility: Secondary | ICD-10-CM

## 2014-09-01 DIAGNOSIS — I10 Essential (primary) hypertension: Secondary | ICD-10-CM | POA: Diagnosis not present

## 2014-09-01 DIAGNOSIS — E119 Type 2 diabetes mellitus without complications: Secondary | ICD-10-CM | POA: Diagnosis not present

## 2014-09-01 DIAGNOSIS — M1990 Unspecified osteoarthritis, unspecified site: Secondary | ICD-10-CM | POA: Diagnosis not present

## 2014-09-01 MED ORDER — GLUCOSE BLOOD VI STRP: ORAL_STRIP | Status: DC

## 2014-09-01 MED ORDER — FREESTYLE LANCETS MISC: Status: DC

## 2014-09-01 MED ORDER — FREESTYLE LITE DEVI: Status: DC

## 2014-09-01 NOTE — Progress Notes (Signed)
[name], female   DOB: 1935/06/08, 79 y.o.   MRN: [id number]    Facility  [facility]    Place of Service:   OFFICE    Allergies  Allergen Reactions  . Erythromycin Hives  . Penicillins Hives and Rash  . Sulfa Antibiotics Hives  . Sulfonamide Derivatives Hives and Rash  . Wool Alcohol [Lanolin Alcohol] Hives  . Neomycin-Bacitracin Zn-Polymyx Rash  . Nitrofurantoin Other (See Comments)    Unknown   . Nitrofurantoin Monohyd Macro Other (See Comments)    Unknown     Chief Complaint  Patient presents with  . Medical Management of Chronic Issues    3 Month Follow up    HPI:  History of fall: no further falls. Remains unsteady on feet, using cane around home and walker when leaves home to go on walks.   Balance disorder: Continues to await commencement of PT, remains unsteady  Diabetes type 2, controlled: stable.  Osteoarthritis, unspecified osteoarthritis type, unspecified site: ongoing.  Xerostomia: Tried Salagen for a few days, felt it increased her saliva too much. Prefers to chew sugar free gum instead of taking Salagen.   Essential hypertension: controlled.  Dyspnea: Reports improvement on Advair. Is short of breath less often and recovers from dyspnea quicker, after 4-5 minutes of rest.   Medications: Patient's Medications  New Prescriptions   No medications on file  Previous Medications   BLOOD GLUCOSE MONITORING SUPPL (FREESTYLE LITE) DEVI    Check blood sugar two times daily E11.9   CHOLECALCIFEROL (VITAMIN D PO)    Take 2,000 Units by mouth daily.   FLAXSEED, LINSEED, (FLAX SEED OIL PO)    Take 1,400 mg by mouth daily.    FLUTICASONE-SALMETEROL (ADVAIR DISKUS) 250-50 MCG/DOSE AEPB    One inhalation twice daily for breathing   GLUCOSE BLOOD TEST STRIP    Freestyle Lite, check blood sugar twice daily E11.9   LANCETS (FREESTYLE) LANCETS    Freestyle Lite, Check blood sugar two times daily E11.9   LINAGLIPTIN-METFORMIN HCL 2.08-998 MG TABS    One each  morning to control diabetes   MECLIZINE (ANTIVERT) 25 MG TABLET    Take one tablet by mouth every 6 hours as needed for dizziness   MULTIPLE VITAMINS-MINERALS (CENTRUM SILVER PO)    Take 1 tablet by mouth daily.    MULTIPLE VITAMINS-MINERALS (HAIR/SKIN/NAILS) TABS    Take 2 tablets by mouth daily.    OMEGA-3 FATTY ACIDS (FISH OIL PO)    Take 1 capsule by mouth daily. 1220 mg/360 Omega 3   RIVAROXABAN (XARELTO) 20 MG TABS TABLET    Take 20 mg by mouth daily with supper.  Modified Medications   No medications on file  Discontinued Medications   PILOCARPINE (SALAGEN) 5 MG TABLET    One 3 times daily to prevent dry mouth     Review of Systems  Constitutional: Positive for fatigue. Negative for fever, chills, diaphoresis, activity change, appetite change and unexpected weight change.  HENT: Negative for congestion, ear discharge, ear pain, hearing loss, postnasal drip, rhinorrhea, sore throat, tinnitus, trouble swallowing and voice change.        Ears itch bilaterally along the ear canal.  Eyes: Negative for pain, redness, itching and visual disturbance.  Respiratory: Positive for cough and shortness of breath (less often). Negative for choking and wheezing.   Cardiovascular: Negative for chest pain, palpitations and leg swelling.  Gastrointestinal: Negative for nausea, abdominal pain, diarrhea, constipation and abdominal distention.  Endocrine: Negative  for cold intolerance, heat intolerance, polydipsia, polyphagia and polyuria.  Genitourinary: Positive for urgency (with incontinence). Negative for dysuria, frequency, hematuria, flank pain, vaginal discharge, difficulty urinating and pelvic pain.       Nocturia 3-4 x per night  Musculoskeletal: Positive for gait problem. Negative for myalgias, back pain, arthralgias, neck pain and neck stiffness.  Skin: Negative for color change, pallor and rash.  Allergic/Immunologic: Negative.   Neurological: Positive for tremors. Negative for dizziness,  seizures, syncope, weakness, numbness and headaches.       Feet burn at night.  Hematological: Negative for adenopathy. Does not bruise/bleed easily.       Anticoagulated with Xarelto due to chronic AF.  Psychiatric/Behavioral: Positive for sleep disturbance. Negative for suicidal ideas, hallucinations, behavioral problems, confusion, dysphoric mood and agitation. The patient is nervous/anxious.     Filed Vitals:   09/01/14 1326  BP: 146/84  Pulse: 79  Temp: 97.6 F (36.4 C)  TempSrc: Oral  Height: 5\' 6"  (1.676 m)  Weight: 138 lb 9.6 oz (62.869 kg)   Body mass index is 22.38 kg/(m^2).  Physical Exam  Constitutional: She is oriented to person, place, and time. She appears well-developed and well-nourished. No distress.  frail  HENT:  Right Ear: External ear normal.  Left Ear: External ear normal.  Nose: Nose normal.  Mouth/Throat: Oropharynx is clear and moist. No oropharyngeal exudate.  Eyes: Conjunctivae and EOM are normal. Pupils are equal, round, and reactive to light. No scleral icterus.  Neck: No JVD present. No tracheal deviation present. No thyromegaly present.  Cardiovascular: Normal rate, normal heart sounds and intact distal pulses.  Exam reveals no gallop and no friction rub.   No murmur heard. AF  Pulmonary/Chest: Effort normal. No respiratory distress. She has no wheezes. She has no rales. She exhibits no tenderness.  Pacemaker in the left upper chest.  Abdominal: She exhibits no distension and no mass. There is no tenderness.  Musculoskeletal: Normal range of motion. She exhibits no edema or tenderness.  Unstable gait. Using cane and walker.  Lymphadenopathy:    She has no cervical adenopathy.  Neurological: She is alert and oriented to person, place, and time. No cranial nerve deficit. Coordination abnormal.  Skin: No rash noted. She is not diaphoretic. No erythema. No pallor.  Psychiatric: She has a normal mood and affect. Her behavior is normal. Judgment and  thought content normal.     Labs reviewed: Clinical Support on 06/30/2014  Component Date Value Ref Range Status  . Date Time Interrogation Session 06/30/2014 [id number]   Final  . Implantable Pulse Generator Manufa* 06/30/2014 Medtronic   Final  . Implantable Pulse Generator Model 06/30/2014 ADSR01 Adapta   Final  . Implantable Pulse Generator Serial* 06/30/2014 [id number]   Final  . Lead Channel Setting Sensing Sensi* 06/30/2014 4.00   Final  . Lead Channel Setting Pacing Pulse * 06/30/2014 0.40   Final  . Lead Channel Setting Pacing Amplit* 06/30/2014 2.500   Final  . Lead Channel Impedance Value 06/30/2014 0   Final  . Lead Channel Impedance Value 06/30/2014 651   Final  . Lead Channel Sensing Intrinsic Amp* 06/30/2014 8.00   Final  . Lead Channel Pacing Threshold Ampl* 06/30/2014 0.500   Final  . Lead Channel Pacing Threshold Puls* 06/30/2014 0.40   Final  . Battery Status 06/30/2014 Unknown   Final  . Battery Remaining Longevity 06/30/2014 85   Final  . Battery Voltage 06/30/2014 2.78   Final  . Battery Impedance  06/30/2014 447   Final  . Loletha Grayer Statistic RV Percent Paced 06/30/2014 78   Final  . Eval Rhythm 06/30/2014 Vs @32    Final  . Miscellaneous Comment 06/30/2014    Final                   Value:Pacemaker check in clinic. Normal device function. Thresholds, sensing, impedances consistent with previous measurements. Device programmed to maximize longevity. No high ventricular rates noted. Device programmed at appropriate safety margins. Histogram  distribution appropriate for patient activity level. Device programmed to optimize intrinsic conduction. Estimated longevity 7 years. ROV in June with Dr. Caryl Comes.      Assessment/Plan 1. History of fall Continue using assistive devices, walker and cane. PT for strengthening and balance improvement as ordered at last visit.  2. Balance disorder To start PT as ordered at last visit  3. Diabetes type 2, controlled CBGs have  on average been in 110s in am and 130s in the afternoon.  - Hemoglobin B7J - Basic metabolic panel  4. Osteoarthritis, unspecified osteoarthritis type, unspecified site Stable  5. Xerostomia Stop Salagen.  6. Essential hypertension Controlled  For burning feet, try an arthritis cream that has menthol in it. Cover feet with thin cotton socks when you go to bed. For itching ears, apply a small amount of 1% hydrocortisone cream in the ear canal nightly.

## 2014-09-01 NOTE — Patient Instructions (Addendum)
For burning feet, try an arthritis cream that has menthol in it. Cover feet with thin cotton socks when you go to bed. For itching ears, apply a small amount of 1% hydrocortisone cream in the ear canal nightly.

## 2014-09-01 NOTE — Telephone Encounter (Signed)
Message received via fax indicating insurance company is no longer covering Accu-chek, alternatives include Freestyle Lite or Precision Xtra.  I called patient to inform her of recent coverage change with her insurance company. Patient was given the option to switch to a covered machine/supplies or to pay out of pocket for Accu-chek. Patient would like to switch to a covered machine.    Order for supplies [employer]) faxed to [employer]

## 2014-09-02 LAB — BASIC METABOLIC PANEL
BUN/Creatinine Ratio: 30 — ABNORMAL HIGH (ref 11–26)
BUN: 25 mg/dL (ref 8–27)
CO2: 20 mmol/L (ref 18–29)
Calcium: 9.6 mg/dL (ref 8.7–10.3)
Chloride: 105 mmol/L (ref 97–108)
Creatinine, Ser: 0.82 mg/dL (ref 0.57–1.00)
GFR, EST AFRICAN AMERICAN: 80 mL/min/{1.73_m2} (ref >59)
GFR, EST NON AFRICAN AMERICAN: 69 mL/min/{1.73_m2} (ref >59)
Glucose: 109 mg/dL — ABNORMAL HIGH (ref 65–99)
Potassium: 4.5 mmol/L (ref 3.5–5.2)
SODIUM: 144 mmol/L (ref 134–144)

## 2014-09-02 LAB — HEMOGLOBIN A1C
ESTIMATED AVERAGE GLUCOSE: 140 mg/dL
Hgb A1c MFr Bld: 6.5 % — ABNORMAL HIGH (ref 4.8–5.6)

## 2014-09-10 ENCOUNTER — Telehealth: Payer: Self-pay | Admitting: *Deleted

## 2014-09-10 MED ORDER — GLUCOSE BLOOD VI STRP: ORAL_STRIP | Status: DC

## 2014-09-10 MED ORDER — FREESTYLE LANCETS MISC: Status: DC

## 2014-09-10 NOTE — Telephone Encounter (Signed)
Spoke with patient regarding her bruised hand , she stated that she got her hand caught in the screen door. She went to the doc in the box, who stated that nothing was broken and she forgot to ask about her Xarelto. I called Dr. Nyoka Cowden to find out what he would like for her to do about blood thinner ? He stated that she should stay off the blood thinner for2 days.

## 2014-09-14 ENCOUNTER — Other Ambulatory Visit: Payer: Self-pay | Admitting: *Deleted

## 2014-09-14 MED ORDER — FREESTYLE LANCETS MISC: Status: AC

## 2014-09-14 MED ORDER — GLUCOSE BLOOD VI STRP: ORAL_STRIP | Status: AC

## 2014-09-14 MED ORDER — FREESTYLE LITE DEVI: Status: AC

## 2014-09-14 NOTE — Telephone Encounter (Signed)
[facility] 

## 2014-09-23 ENCOUNTER — Ambulatory Visit (INDEPENDENT_AMBULATORY_CARE_PROVIDER_SITE_OTHER): Payer: Medicare Other | Admitting: Cardiology

## 2014-09-23 ENCOUNTER — Encounter: Payer: Self-pay | Admitting: Cardiology

## 2014-09-23 VITALS — BP 130/64 | HR 75 | Ht 65.5 in | Wt 138.8 lb

## 2014-09-23 DIAGNOSIS — Z950 Presence of cardiac pacemaker: Secondary | ICD-10-CM | POA: Diagnosis not present

## 2014-09-23 DIAGNOSIS — I482 Chronic atrial fibrillation, unspecified: Secondary | ICD-10-CM

## 2014-09-23 DIAGNOSIS — Z7901 Long term (current) use of anticoagulants: Secondary | ICD-10-CM

## 2014-09-23 NOTE — Progress Notes (Signed)
Cardiology Office Note   Date:  09/23/2014   ID:  Erin Vang, DOB [birth year], MRN [id number]  PCP:  Estill Dooms, MD  Cardiologist: Darlin Coco MD  Chief Complaint  Patient presents with  . Annual Exam      History of Present Illness: Erin Vang is a 79 y.o. female who presents for cardiology follow-up   She has a past history of tachybradycardia syndrome with chronic atrial fibrillation and is on Xarelto. She has a permanent pacemaker implanted by Dr. Caryl Comes. She has a history of tachybradycardia syndrome. We had great difficulty in preventing her from having very high heart rates. On 05/22/13 she underwent AV node ablation by Dr. Caryl Comes.   Her last echocardiogram on 05/27/13 showed an ejection fraction of 50-55% with mild aortic insufficiency and mild mitral regurgitation.  The patient has a past history of depression. She previously was on citalopram. With time, her situational depression has improved.  She no longer feels that she is depressed.  She is not taking any heavy to present.  She no longer drives.  She still lives alone in her same house.  Friends take her shopping. She denies any chest pain or shortness of breath.  Her main complaint is some unsteadiness of gait which may be from her diabetic condition.  She does walk with a fourposter cane which helps  Past Medical History  Diagnosis Date  . Hypertension   . Osteoarthritis   . Hypercholesterolemia   . Paroxysmal atrial fibrillation     CHADS2: 2; managed with rate control and anticoagulation; s/p AVN ablation 04/2013  . Syncope and collapse     When patient stands up real quickly  . Normal cardiac stress test     a.  lexiscan MV - EF 72%, No ischemia  . Anemia     ? of lab error vs GI bleed  . COPD (chronic obstructive pulmonary disease)   . Chronic anticoagulation     On Xarelto  . Exertional dyspnea   . Type II diabetes mellitus   . History of blood transfusion     "wasn't needed; had some  bad lab results" (03-14-12)  . Depression   . Prolonged grief reaction     "husband died 1 yr ago" (03/14/2012)  . Urinary incontinence   . Benign paroxysmal positional vertigo 07/22/2012  . Tinnitus of left ear 07/22/2012  . Unspecified constipation   . Insomnia, unspecified   . Anxiety state, unspecified     Past Surgical History  Procedure Laterality Date  . Appendectomy  1952  . Cervical disc surgery  04/2009  . Hip arthroplasty  11/08/2011    Procedure: ARTHROPLASTY BIPOLAR HIP;  Surgeon: Mauri Pole, MD;  Location: [place];  Service: Orthopedics;  Laterality: Left;  . Insert / replace / remove pacemaker  03-14-12  . Tubal ligation  1974  . Cholecystectomy  1984  . Polypectomy  11/21/1999    Endometrial polyps  . Eye surgery Right 03/2006    Cataract surgery  . Eye surgery  11/2004    Lid surgery  . Ablation  05/22/2013    AVN ablation by Dr Caryl Comes  . Permanent pacemaker insertion N/A 2012-03-14    Procedure: PERMANENT PACEMAKER INSERTION;  Surgeon: Deboraha Sprang, MD;  Location: [facility] CATH LAB;  Service: Cardiovascular;  Laterality: N/A;  . Av node ablation N/A 05/22/2013    Procedure: AV NODE ABLATION;  Surgeon: Deboraha Sprang, MD;  Location: [facility] CATH  LAB;  Service: Cardiovascular;  Laterality: N/A;     Current Outpatient Prescriptions  Medication Sig Dispense Refill  . Blood Glucose Monitoring Suppl (FREESTYLE LITE) DEVI Check blood sugar two times daily E11.9 1 each 0  . Cholecalciferol (VITAMIN D PO) Take 1,000 Units by mouth daily.     . Flaxseed, Linseed, (FLAX SEED OIL PO) Take 1,400 mg by mouth daily.     . Fluticasone-Salmeterol (ADVAIR DISKUS) 250-50 MCG/DOSE AEPB One inhalation twice daily for breathing 3 each 4  . glucose blood test strip Freestyle Lite, check blood sugar twice daily E11.9 200 each 3  . Lancets (FREESTYLE) lancets Freestyle Lite, Check blood sugar two times daily E11.9 200 each 3  . Linagliptin-Metformin HCl 2.08-998 MG TABS One each morning to control  diabetes 90 tablet 4  . meclizine (ANTIVERT) 25 MG tablet Take one tablet by mouth every 6 hours as needed for dizziness 50 tablet 5  . Multiple Vitamins-Minerals (CENTRUM SILVER PO) Take 1 tablet by mouth daily.     . rivaroxaban (XARELTO) 20 MG TABS tablet Take 20 mg by mouth daily with supper.     No current facility-administered medications for this visit.    Allergies:   Erythromycin; Penicillins; Sulfa antibiotics; Sulfonamide derivatives; Wool alcohol; Neomycin-bacitracin zn-polymyx; Nitrofurantoin; and Nitrofurantoin monohyd macro    Social History:  The patient  reports that she quit smoking about 28 years ago. Her smoking use included Cigarettes. She has a 70 pack-year smoking history. She has never used smokeless tobacco. She reports that she does not drink alcohol or use illicit drugs.   Family History:  The patient's family history includes Arthritis in her mother; Cancer in her father; Early death in her son; Heart failure in her mother; Kidney nephrosis in her mother; Ulcers in her mother. There is no history of Heart attack.    ROS:  Please see the history of present illness.   Otherwise, review of systems are positive for none.   All other systems are reviewed and negative.    PHYSICAL EXAM: VS:  BP 130/64 mmHg  Pulse 75  Ht 5' 5.5" (1.664 m)  Wt 138 lb 12.8 oz (62.959 kg)  BMI 22.74 kg/m2 , BMI Body mass index is 22.74 kg/(m^2). GEN: Well nourished, well developed, in no acute distress HEENT: normal Neck: no JVD, carotid bruits, or masses Cardiac: RRR; no murmurs, rubs, or gallops,no edema  Respiratory:  clear to auscultation bilaterally, normal work of breathing GI: soft, nontender, nondistended, + BS MS: no deformity or atrophy Skin: warm and dry, no rash Neuro:  Strength and sensation are intact Psych: euthymic mood, full affect   EKG:  EKG is ordered today. The ekg ordered today demonstrates ventricular pacemaker.  Pacing at 75 bpm.  Underlying atrial  fibrillation is noted.   Recent Labs: 10/05/2013: B Natriuretic Peptide 170.7* 11/12/2013: Hemoglobin 13.6; Platelets 291 05/31/2014: ALT 15 09/01/2014: BUN 25; Creatinine 0.82; Potassium 4.5; Sodium 144    Lipid Panel    Component Value Date/Time   CHOL 193 05/31/2014 1434   TRIG 118 05/31/2014 1434   HDL 55 05/31/2014 1434   CHOLHDL 3.5 05/31/2014 1434   LDLCALC 114* 05/31/2014 1434      Wt Readings from Last 3 Encounters:  09/23/14 138 lb 12.8 oz (62.959 kg)  09/01/14 138 lb 9.6 oz (62.869 kg)  08/10/14 137 lb 6.4 oz (62.324 kg)        ASSESSMENT AND PLAN:  1. permanent atrial fibrillation, on long-term Xarelto 2.  Status post pacemaker and AV node ablation 3. situational depression, resolved. 4. Hypercholesterolemia 5. essential hypertension 6. Osteoarthritis 7.  Diabetes mellitus, adult-onset followed by PCP 8.  Poor balance.  This may be related to diabetic neuropathy.  I checked her for orthostatic hypotension today and there was no significant change in blood pressure when she stands up.  Disposition: Continue current medication.  Recheck in 6 months. Current medicines are reviewed at length with the patient today.  The patient does not have concerns regarding medicines.  The following changes have been made:  no change  Labs/ tests ordered today include:  No orders of the defined types were placed in this encounter.      Berna Spare MD 09/23/2014 4:46 PM    [facility] [place], [place], [place]  [address] Phone: [phone]; Fax: [phone]

## 2014-09-23 NOTE — Patient Instructions (Signed)
Medication Instructions:  Your physician recommends that you continue on your current medications as directed. Please refer to the Current Medication list given to you today.  Labwork: NONE  Testing/Procedures: NONE  Follow-Up: Your physician wants you to follow-up in: [place] will receive a reminder letter in the mail two months in advance. If you don't receive a letter, please call our office to schedule the follow-up appointment.

## 2014-10-05 ENCOUNTER — Encounter: Payer: Self-pay | Admitting: Internal Medicine

## 2014-10-05 ENCOUNTER — Ambulatory Visit (INDEPENDENT_AMBULATORY_CARE_PROVIDER_SITE_OTHER): Payer: Medicare Other | Admitting: Internal Medicine

## 2014-10-05 VITALS — BP 144/62 | HR 87 | Ht 65.5 in | Wt 141.8 lb

## 2014-10-05 DIAGNOSIS — I442 Atrioventricular block, complete: Secondary | ICD-10-CM | POA: Diagnosis not present

## 2014-10-05 DIAGNOSIS — I495 Sick sinus syndrome: Secondary | ICD-10-CM | POA: Diagnosis not present

## 2014-10-05 LAB — CUP PACEART INCLINIC DEVICE CHECK
Battery Remaining Longevity: 74 mo
Battery Voltage: 2.77 V
Brady Statistic RV Percent Paced: 97 %
Date Time Interrogation Session: 20160614173421
Lead Channel Impedance Value: 0 Ohm
Lead Channel Pacing Threshold Amplitude: 0.5 V
Lead Channel Pacing Threshold Pulse Width: 0.4 ms
Lead Channel Sensing Intrinsic Amplitude: 8 mV
Lead Channel Setting Pacing Amplitude: 2.5 V
MDC IDC MSMT BATTERY IMPEDANCE: 570 Ohm
MDC IDC MSMT LEADCHNL RV IMPEDANCE VALUE: 583 Ohm
MDC IDC SET LEADCHNL RV PACING PULSEWIDTH: 0.4 ms
MDC IDC SET LEADCHNL RV SENSING SENSITIVITY: 4 mV

## 2014-10-05 NOTE — Progress Notes (Signed)
Patient Care Team: Estill Dooms, MD as PCP - General (Internal Medicine) Darlin Coco, MD as Referring Physician (Cardiology) Bjorn Loser, MD as Consulting Physician (Urology) Deboraha Sprang, MD as Consulting Physician (Cardiology) Star Age, MD as Attending Physician (Neurology)   HPI  Erin Vang is a 79 y.o. female Seen in followup for atrial fibrillation and permanent associated with syncope and greater than 5 second pauses prompting implantation of a pacemaker 11/13. She is on Rivaroxaban  Pharmacological efforts to control rate failed and were further complicated by dizziness and persistent exercise intolerance. She underwent AV junction ablation    Echocardiogram 2/15 demonstrated normal LV function  She's been complaining of exertional shortness of breath.  Her breathing is "terrible"  Her eyes are "terrible"  N o chest pain or edema   A chest x-ray obtained 6/15 demonstrated no interval change and no active disease.  A. Myoview scan done in 2012 have been normal  Past Medical History  Diagnosis Date  . Hypertension   . Osteoarthritis   . Hypercholesterolemia   . Paroxysmal atrial fibrillation     CHADS2: 2; managed with rate control and anticoagulation; s/p AVN ablation 04/2013  . Syncope and collapse     When patient stands up real quickly  . Normal cardiac stress test     a.  lexiscan MV - EF 72%, No ischemia  . Anemia     ? of lab error vs GI bleed  . COPD (chronic obstructive pulmonary disease)   . Chronic anticoagulation     On Xarelto  . Exertional dyspnea   . Type II diabetes mellitus   . History of blood transfusion     "wasn't needed; had some bad lab results" (Mar 05, 2012)  . Depression   . Prolonged grief reaction     "husband died 1 yr ago" (03-05-12)  . Urinary incontinence   . Benign paroxysmal positional vertigo 07/22/2012  . Tinnitus of left ear 07/22/2012  . Unspecified constipation   . Insomnia, unspecified   . Anxiety  state, unspecified     Past Surgical History  Procedure Laterality Date  . Appendectomy  1952  . Cervical disc surgery  04/2009  . Hip arthroplasty  11/08/2011    Procedure: ARTHROPLASTY BIPOLAR HIP;  Surgeon: Mauri Pole, MD;  Location: [facility] [place];  Service: Orthopedics;  Laterality: Left;  . Insert / replace / remove pacemaker  05-Mar-2012  . Tubal ligation  1974  . Cholecystectomy  1984  . Polypectomy  11/21/1999    Endometrial polyps  . Eye surgery Right 03/2006    Cataract surgery  . Eye surgery  11/2004    Lid surgery  . Ablation  05/22/2013    AVN ablation by Dr Caryl Comes  . Permanent pacemaker insertion N/A 03/05/12    Procedure: PERMANENT PACEMAKER INSERTION;  Surgeon: Deboraha Sprang, MD;  Location: [facility] CATH LAB;  Service: Cardiovascular;  Laterality: N/A;  . Av node ablation N/A 05/22/2013    Procedure: AV NODE ABLATION;  Surgeon: Deboraha Sprang, MD;  Location: [facility] CATH LAB;  Service: Cardiovascular;  Laterality: N/A;    Current Outpatient Prescriptions  Medication Sig Dispense Refill  . Blood Glucose Monitoring Suppl (FREESTYLE LITE) DEVI Check blood sugar two times daily E11.9 1 each 0  . Cholecalciferol (VITAMIN D PO) Take 1,000 Units by mouth daily.     . Flaxseed, Linseed, (FLAX SEED OIL PO) Take 1,400 mg by mouth daily.     Marland Kitchen  Fluticasone-Salmeterol (ADVAIR DISKUS) 250-50 MCG/DOSE AEPB One inhalation twice daily for breathing 3 each 4  . glucose blood test strip Freestyle Lite, check blood sugar twice daily E11.9 200 each 3  . Lancets (FREESTYLE) lancets Freestyle Lite, Check blood sugar two times daily E11.9 200 each 3  . Linagliptin-Metformin HCl 2.08-998 MG TABS One each morning to control diabetes 90 tablet 4  . meclizine (ANTIVERT) 25 MG tablet Take one tablet by mouth every 6 hours as needed for dizziness 50 tablet 5  . Multiple Vitamins-Minerals (CENTRUM SILVER PO) Take 1 tablet by mouth daily.     . rivaroxaban (XARELTO) 20 MG TABS tablet Take 20 mg by mouth daily with  supper.     No current facility-administered medications for this visit.    Allergies  Allergen Reactions  . Erythromycin Hives  . Penicillins Hives and Rash  . Sulfa Antibiotics Hives  . Sulfonamide Derivatives Hives and Rash  . Wool Alcohol [Lanolin Alcohol] Hives  . Neomycin-Bacitracin Zn-Polymyx Rash  . Nitrofurantoin Other (See Comments)    Unknown   . Nitrofurantoin Monohyd Macro Other (See Comments)    Unknown     Review of Systems negative except from HPI and PMH  Physical Exam BP 144/62 mmHg  Pulse 87  Ht 5' 5.5" (1.664 m)  Wt 141 lb 12.8 oz (64.32 kg)  BMI 23.23 kg/m2  SpO2 98% Well developed and well nourished in no acute distress HENT normal E scleral and icterus clear Neck Supple JVP flat; carotids brisk and full Clear to ausculation   Device pocket well healed; without hematoma or erythema.  There is no tethering  Regular rate and rhythm, no murmurs gallops or rub Soft with active bowel sounds No clubbing cyanosis  Edema Alert and oriented, grossly normal motor and sensory function Skin Warm and Dry    Assessment and  Plan  Dyspnea on exertion  Atrial fibrillation-permanent    Hypertension   Complete heart block S./P. AV junction ablation  Pacemaker-[employer] The patient's device was interrogated.  The information was reviewed. No changes were made in the programming.       she is permanently anticoagulated with Rivaroxaban and based on renal function 5/16 dose is appropriated Efforts to sort out and improve her dyspnea over the last year have been woefully unsuccessful  Continue current course  BP reasonably controlled

## 2014-10-05 NOTE — Patient Instructions (Signed)
Medication Instructions:  Your physician recommends that you continue on your current medications as directed. Please refer to the Current Medication list given to you today.   Labwork: NONE  Testing/Procedures: NONE  Follow-Up: Remote monitoring is used to monitor your Pacemaker or ICD from home. This monitoring reduces the number of office visits required to check your device to one time per year. It allows [place] to keep an eye on the functioning of your device to ensure it is working properly. You are scheduled for a device check from home on 01/04/2015. You may send your transmission at any time that day. If you have a wireless device, the transmission will be sent automatically. After your physician reviews your transmission, you will receive a postcard with your next transmission date.  Your physician wants you to follow-up in: 12 months with Dr. Caryl Comes. You will receive a reminder letter in the mail two months in advance. If you don't receive a letter, please call our office to schedule the follow-up appointment.    Any Other Special Instructions Will Be Listed Below (If Applicable).

## 2014-10-08 ENCOUNTER — Encounter: Payer: Self-pay | Admitting: Internal Medicine

## 2014-10-15 ENCOUNTER — Other Ambulatory Visit: Payer: Self-pay | Admitting: Internal Medicine

## 2014-10-19 DIAGNOSIS — I110 Hypertensive heart disease with heart failure: Secondary | ICD-10-CM

## 2014-10-19 DIAGNOSIS — R531 Weakness: Secondary | ICD-10-CM | POA: Diagnosis not present

## 2014-10-19 DIAGNOSIS — R269 Unspecified abnormalities of gait and mobility: Secondary | ICD-10-CM | POA: Diagnosis not present

## 2014-10-19 DIAGNOSIS — E119 Type 2 diabetes mellitus without complications: Secondary | ICD-10-CM

## 2014-10-22 ENCOUNTER — Ambulatory Visit: Payer: Medicare Other

## 2014-11-17 ENCOUNTER — Telehealth: Payer: Self-pay

## 2014-11-17 NOTE — Telephone Encounter (Signed)
Message was left on the triage voicemail. Patient with bowel and urinary incontinence. Erin Vang state patient has a pending appointment next Tuesday. Erin Vang is requesting a verbal order for a nurse to go out and colect a urinalysis on patient.

## 2014-11-17 NOTE — Telephone Encounter (Signed)
I approve of UA and culture.

## 2014-11-17 NOTE — Telephone Encounter (Signed)
I called and gave verbal order for U/A to be collected. Ebony Hail will fax an order to be signed by Dr.Green  Message routed to [place] as a [place]

## 2014-11-23 ENCOUNTER — Encounter: Payer: Self-pay | Admitting: Internal Medicine

## 2014-11-23 ENCOUNTER — Telehealth: Payer: Self-pay

## 2014-11-23 ENCOUNTER — Ambulatory Visit (INDEPENDENT_AMBULATORY_CARE_PROVIDER_SITE_OTHER): Payer: Medicare Other | Admitting: Internal Medicine

## 2014-11-23 VITALS — BP 126/76 | HR 86 | Temp 98.0°F | Resp 20 | Ht 66.0 in | Wt 134.4 lb

## 2014-11-23 DIAGNOSIS — E119 Type 2 diabetes mellitus without complications: Secondary | ICD-10-CM

## 2014-11-23 DIAGNOSIS — M8580 Other specified disorders of bone density and structure, unspecified site: Secondary | ICD-10-CM

## 2014-11-23 DIAGNOSIS — I509 Heart failure, unspecified: Secondary | ICD-10-CM

## 2014-11-23 DIAGNOSIS — E7800 Pure hypercholesterolemia, unspecified: Secondary | ICD-10-CM

## 2014-11-23 DIAGNOSIS — N3941 Urge incontinence: Secondary | ICD-10-CM

## 2014-11-23 DIAGNOSIS — R634 Abnormal weight loss: Secondary | ICD-10-CM

## 2014-11-23 DIAGNOSIS — R269 Unspecified abnormalities of gait and mobility: Secondary | ICD-10-CM | POA: Diagnosis not present

## 2014-11-23 DIAGNOSIS — F411 Generalized anxiety disorder: Secondary | ICD-10-CM

## 2014-11-23 DIAGNOSIS — R197 Diarrhea, unspecified: Secondary | ICD-10-CM | POA: Diagnosis not present

## 2014-11-23 DIAGNOSIS — I10 Essential (primary) hypertension: Secondary | ICD-10-CM

## 2014-11-23 DIAGNOSIS — M810 Age-related osteoporosis without current pathological fracture: Secondary | ICD-10-CM

## 2014-11-23 DIAGNOSIS — J449 Chronic obstructive pulmonary disease, unspecified: Secondary | ICD-10-CM | POA: Diagnosis not present

## 2014-11-23 DIAGNOSIS — M6250 Muscle wasting and atrophy, not elsewhere classified, unspecified site: Secondary | ICD-10-CM

## 2014-11-23 DIAGNOSIS — I482 Chronic atrial fibrillation, unspecified: Secondary | ICD-10-CM

## 2014-11-23 DIAGNOSIS — R531 Weakness: Secondary | ICD-10-CM

## 2014-11-23 MED ORDER — MIRABEGRON ER 25 MG PO TB24: ORAL_TABLET | ORAL | Status: AC

## 2014-11-23 NOTE — Telephone Encounter (Signed)
I called [facility]: Per Dr.Green patient needs PFT's ( bronchodilator before and after).  Appointment scheduled for August 17,2016 at 4:00 pm. Instructions: No smoking for 1 hour, no alcohol for 1 hour, comfortable clothes, no inhalers/nebulizer for 3 hours prior.   I called patient's son with instructions and appointment date.

## 2014-11-23 NOTE — Progress Notes (Signed)
[name], female   DOB: [birth year], 79 y.o.   MRN: [id number]    Facility  [facility]    Place of Service:   OFFICE    Allergies  Allergen Reactions  . Erythromycin Hives  . Penicillins Hives and Rash  . Sulfa Antibiotics Hives  . Sulfonamide Derivatives Hives and Rash  . Wool Alcohol [Lanolin Alcohol] Hives  . Neomycin-Bacitracin Zn-Polymyx Rash  . Nitrofurantoin Other (See Comments)    Unknown   . Nitrofurantoin Monohyd Macro Other (See Comments)    Unknown     Chief Complaint  Patient presents with  . Acute Visit    Loss of bowel control and may have a uti, has had several falls in the last week    HPI:  Incontinent of bowel and bladder. Cloudy urine obtained 11/20/14 by home care nurse.  Chronic atrial fibrillation: Controlled rate. Denies chest discomfort.  Chronic obstructive pulmonary disease, unspecified COPD, unspecified chronic bronchitis type -continues with chronic shortness of breath.  Diabetes type 2, controlled: Last A1c 6.5. Fasting glucose 109.  Essential hypertension: Controlled  Hypercholesterolemia: Last lipid panel in February 2016 was normal  Weight loss, non-intentional: Poor caloric intake. Patient unable to stand clock. Forms of meals in the microwave.  Urge incontinence - continues with urinary incontinence  Congestive heart failure, unspecified congestive heart failure chronicity, unspecified congestive heart failure type: May be contributing generalized weakness. Denies chest pain. She is chronically short of breath, but this is likely attributable to her COPD. No increase in edema.  Abnormality of gait: High risk for falls. Very unstable.  Anxiety state: Chronic. Not any worse.  Muscle atrophy: Contributes to poor mobility and unstable gait  Diarrhea: Fecal incontinence.  Weak: Generalized weakness. Patient currently stays at home without assistance most hours of the day. She does have home health nursing, home occupational  therapy, and home physical therapy visiting her. She has family members that check on her.  Osteopenia: Noted on prior x-rays  Osteoporosis - Plan: [facility]    Medications: Patient's Medications  New Prescriptions   No medications on file  Previous Medications   BLOOD GLUCOSE MONITORING SUPPL (FREESTYLE LITE) DEVI    Check blood sugar two times daily E11.9   CHOLECALCIFEROL (VITAMIN D PO)    Take 1,000 Units by mouth daily.    FLAXSEED, LINSEED, (FLAX SEED OIL PO)    Take 1,400 mg by mouth daily.    FLUTICASONE-SALMETEROL (ADVAIR DISKUS) 250-50 MCG/DOSE AEPB    One inhalation twice daily for breathing   GLUCOSE BLOOD TEST STRIP    Freestyle Lite, check blood sugar twice daily E11.9   LANCETS (FREESTYLE) LANCETS    Freestyle Lite, Check blood sugar two times daily E11.9   LINAGLIPTIN-METFORMIN HCL 2.08-998 MG TABS    One each morning to control diabetes   MECLIZINE (ANTIVERT) 25 MG TABLET    Take one tablet by mouth every 6 hours as needed for dizziness   MULTIPLE VITAMINS-MINERALS (CENTRUM SILVER PO)    Take 1 tablet by mouth daily.    RIVAROXABAN (XARELTO) 20 MG TABS TABLET    Take 20 mg by mouth daily with supper.  Modified Medications   No medications on file  Discontinued Medications   XARELTO 20 MG TABS TABLET    TAKE 1 TABLET DAILY     Review of Systems  Constitutional: Positive for fatigue. Negative for fever, chills, diaphoresis, activity change, appetite change and unexpected weight change.  HENT: Negative for congestion,  ear discharge, ear pain, hearing loss, postnasal drip, rhinorrhea, sore throat, tinnitus, trouble swallowing and voice change.        Ears itch bilaterally along the ear canal.  Eyes: Negative for pain, redness, itching and visual disturbance.  Respiratory: Positive for cough and shortness of breath (less often). Negative for choking and wheezing.   Cardiovascular: Negative for chest pain, palpitations and leg swelling.  Gastrointestinal:  Negative for nausea, abdominal pain, diarrhea, constipation and abdominal distention.       Loose stools and fecal incontinence.  Endocrine: Negative for cold intolerance, heat intolerance, polydipsia, polyphagia and polyuria.  Genitourinary: Positive for urgency (with incontinence). Negative for dysuria, frequency, hematuria, flank pain, vaginal discharge, difficulty urinating and pelvic pain.       Nocturia 3-4 x per night. Incontinent.  Musculoskeletal: Positive for gait problem. Negative for myalgias, back pain, arthralgias, neck pain and neck stiffness.  Skin: Negative for color change, pallor and rash.  Allergic/Immunologic: Negative.   Neurological: Positive for tremors. Negative for dizziness, seizures, syncope, weakness, numbness and headaches.       Feet burn at night. Increasing problems with memory.  Hematological: Negative for adenopathy. Does not bruise/bleed easily.       Anticoagulated with Xarelto due to chronic AF.  Psychiatric/Behavioral: Positive for sleep disturbance. Negative for suicidal ideas, hallucinations, behavioral problems, confusion, dysphoric mood and agitation. The patient is nervous/anxious.     Filed Vitals:   11/23/14 1423  BP: 126/76  Pulse: 86  Temp: 98 F (36.7 C)  TempSrc: Oral  Resp: 20  Height: 5\' 6"  (1.676 m)  Weight: 134 lb 6.4 oz (60.963 kg)  SpO2: 96%   Body mass index is 21.7 kg/(m^2).  Physical Exam  Constitutional: She is oriented to person, place, and time. She appears well-developed and well-nourished. No distress.  frail  HENT:  Right Ear: External ear normal.  Left Ear: External ear normal.  Nose: Nose normal.  Mouth/Throat: Oropharynx is clear and moist. No oropharyngeal exudate.  Eyes: Conjunctivae and EOM are normal. Pupils are equal, round, and reactive to light. No scleral icterus.  Neck: No JVD present. No tracheal deviation present. No thyromegaly present.  Cardiovascular: Normal rate, normal heart sounds and intact  distal pulses.  Exam reveals no gallop and no friction rub.   No murmur heard. AF  Pulmonary/Chest: Effort normal. No respiratory distress. She has no wheezes. She has no rales. She exhibits no tenderness.  Pacemaker in the left upper chest.  Abdominal: She exhibits no distension and no mass. There is no tenderness.  Musculoskeletal: Normal range of motion. She exhibits no edema or tenderness.  Unstable gait. Using cane and walker. Generalized weakness.  Lymphadenopathy:    She has no cervical adenopathy.  Neurological: She is alert and oriented to person, place, and time. No cranial nerve deficit. Coordination abnormal.  Forgetful.  Skin: No rash noted. She is not diaphoretic. No erythema. No pallor.  Psychiatric: She has a normal mood and affect. Her behavior is normal. Judgment and thought content normal.     Labs reviewed: Office Visit on 10/05/2014  Component Date Value Ref Range Status  . Date Time Interrogation Session 10/05/2014 [id number]   Preliminary  . Pulse Generator Manufacturer 10/05/2014 MERM   Preliminary  . Pulse Gen Model 10/05/2014 ADSR01 Adapta   Preliminary  . Pulse Gen Serial Number 10/05/2014 [id number]   Preliminary  . Implantable Pulse Generator Type 10/05/2014 Implantable Pulse Generator   Preliminary  . Implantable Pulse Generator Implan* 10/05/2014  [id number]   Preliminary  . Lead Channel Setting Sensing Sensi* 10/05/2014 4.00   Preliminary  . Lead Channel Setting Pacing Pulse * 10/05/2014 0.40   Preliminary  . Lead Channel Setting Pacing Amplit* 10/05/2014 2.500   Preliminary  . Lead Channel Impedance Value 10/05/2014 0   Preliminary  . Lead Channel Impedance Value 10/05/2014 583   Preliminary  . Lead Channel Sensing Intrinsic Amp* 10/05/2014 8.00   Preliminary  . Lead Channel Pacing Threshold Ampl* 10/05/2014 0.500   Preliminary  . Lead Channel Pacing Threshold Puls* 10/05/2014 0.40   Preliminary  . Battery Status 10/05/2014 Unknown    Preliminary  . Battery Remaining Longevity 10/05/2014 74   Preliminary  . Battery Voltage 10/05/2014 2.77   Preliminary  . Battery Impedance 10/05/2014 570   Preliminary  . Loletha Grayer Statistic RV Percent Paced 10/05/2014 97   Preliminary  . Eval Rhythm 10/05/2014 Vs 52   Preliminary  Office Visit on 09/01/2014  Component Date Value Ref Range Status  . Hgb A1c MFr Bld 09/01/2014 6.5* 4.8 - 5.6 % Final   Comment:          Pre-diabetes: 5.7 - 6.4          Diabetes: >6.4          Glycemic control for adults with diabetes: <7.0   . Est. average glucose Bld gHb Est-m* 09/01/2014 140   Final  . Glucose 09/01/2014 109* 65 - 99 mg/dL Final  . BUN 09/01/2014 25  8 - 27 mg/dL Final  . Creatinine, Ser 09/01/2014 0.82  0.57 - 1.00 mg/dL Final  . GFR calc non Af Amer 09/01/2014 69  >59 mL/min/1.73 Final  . GFR calc Af Amer 09/01/2014 80  >59 mL/min/1.73 Final  . BUN/Creatinine Ratio 09/01/2014 30* 11 - 26 Final  . Sodium 09/01/2014 144  134 - 144 mmol/L Final  . Potassium 09/01/2014 4.5  3.5 - 5.2 mmol/L Final  . Chloride 09/01/2014 105  97 - 108 mmol/L Final  . CO2 09/01/2014 20  18 - 29 mmol/L Final  . Calcium 09/01/2014 9.6  8.7 - 10.3 mg/dL Final     Assessment/Plan  1. Chronic atrial fibrillation Stable with a controlled rate  2. Chronic obstructive pulmonary disease, unspecified COPD, unspecified chronic bronchitis type Persistent shortness of breath. Needs before and after bronchodillator spirometry. - PR EVAL OF BRONCHOSPASM  3. Diabetes type 2, controlled Continue linagliptan-metformin  4. Essential hypertension Continue current medication  5. Hypercholesterolemia Continue to monitor  6. Weight loss, non-intentional Discussed possible dietary choices with patient and family member that was present today. Caloric insufficiency seems to be the major problem.  7. Urge incontinence - mirabegron ER (MYRBETRIQ) 25 MG TB24 tablet; One daily to help bladder control  Dispense: 30  tablet; Refill: 5  8. Congestive heart failure, unspecified congestive heart failure chronicity, unspecified congestive heart failure type Compensated  9. Abnormality of gait Continue with use of cane and walker. High risk for falls.  10. Anxiety state Opinion a monitor  11. Muscle atrophy Continue with home PT and OT  12. Diarrhea Continue to monitor  13. Weak Continue to monitor  14. Osteopenia Schedule bone density  15. Osteoporosis Schedule bone density - [facility] Bone Density; Future  Patient is at high risk by staying at home. She should have 24/7 attendance. She is not in favor of going to continuing care facilities.

## 2014-11-24 ENCOUNTER — Telehealth: Payer: Self-pay | Admitting: *Deleted

## 2014-11-24 MED ORDER — CIPROFLOXACIN HCL 250 MG PO TABS: ORAL_TABLET | ORAL | Status: DC

## 2014-11-24 NOTE — Telephone Encounter (Signed)
Received Urine culture from [facility] and was positive for infection. Per Dr. Phoebe Perch 250mg  One tablet daily for infection #20 Patient notified and agreed. Faxed to pharmacy.

## 2014-12-07 ENCOUNTER — Other Ambulatory Visit: Payer: Self-pay | Admitting: Internal Medicine

## 2014-12-07 DIAGNOSIS — R0600 Dyspnea, unspecified: Secondary | ICD-10-CM

## 2014-12-08 ENCOUNTER — Ambulatory Visit (INDEPENDENT_AMBULATORY_CARE_PROVIDER_SITE_OTHER): Payer: Medicare Other | Admitting: Internal Medicine

## 2014-12-08 DIAGNOSIS — R0600 Dyspnea, unspecified: Secondary | ICD-10-CM

## 2014-12-08 LAB — PULMONARY FUNCTION TEST
DL/VA % pred: 84 %
DL/VA: 4.17 ml/min/mmHg/L
DLCO UNC % PRED: 49 %
DLCO UNC: 12.68 ml/min/mmHg
FEF 25-75 POST: 1.22 L/s
FEF 25-75 Pre: 1.04 L/sec
FEF2575-%CHANGE-POST: 17 %
FEF2575-%PRED-POST: 76 %
FEF2575-%Pred-Pre: 64 %
FEV1-%CHANGE-POST: 2 %
FEV1-%PRED-POST: 62 %
FEV1-%PRED-PRE: 60 %
FEV1-POST: 1.33 L
FEV1-Pre: 1.3 L
FEV1FVC-%CHANGE-POST: 2 %
FEV1FVC-%PRED-PRE: 103 %
FEV6-%Change-Post: 0 %
FEV6-%Pred-Post: 63 %
FEV6-%Pred-Pre: 62 %
FEV6-POST: 1.71 L
FEV6-Pre: 1.7 L
FEV6FVC-%Pred-Post: 105 %
FEV6FVC-%Pred-Pre: 105 %
FVC-%Change-Post: 0 %
FVC-%PRED-PRE: 59 %
FVC-%Pred-Post: 60 %
FVC-POST: 1.71 L
FVC-Pre: 1.7 L
PRE FEV6/FVC RATIO: 100 %
Post FEV1/FVC ratio: 78 %
Post FEV6/FVC ratio: 100 %
Pre FEV1/FVC ratio: 76 %

## 2014-12-08 NOTE — Progress Notes (Signed)
PFT done today. 

## 2014-12-15 ENCOUNTER — Encounter: Payer: Self-pay | Admitting: Internal Medicine

## 2014-12-15 ENCOUNTER — Ambulatory Visit (INDEPENDENT_AMBULATORY_CARE_PROVIDER_SITE_OTHER): Payer: Medicare Other | Admitting: Internal Medicine

## 2014-12-15 VITALS — BP 140/90 | HR 83 | Temp 98.0°F | Resp 20 | Ht 66.0 in | Wt 140.2 lb

## 2014-12-15 DIAGNOSIS — R634 Abnormal weight loss: Secondary | ICD-10-CM | POA: Diagnosis not present

## 2014-12-15 DIAGNOSIS — F0280 Dementia in other diseases classified elsewhere without behavioral disturbance: Secondary | ICD-10-CM

## 2014-12-15 DIAGNOSIS — R29818 Other symptoms and signs involving the nervous system: Secondary | ICD-10-CM | POA: Diagnosis not present

## 2014-12-15 DIAGNOSIS — I482 Chronic atrial fibrillation, unspecified: Secondary | ICD-10-CM

## 2014-12-15 DIAGNOSIS — Z9181 History of falling: Secondary | ICD-10-CM

## 2014-12-15 DIAGNOSIS — J449 Chronic obstructive pulmonary disease, unspecified: Secondary | ICD-10-CM

## 2014-12-15 DIAGNOSIS — Z7901 Long term (current) use of anticoagulants: Secondary | ICD-10-CM

## 2014-12-15 DIAGNOSIS — I10 Essential (primary) hypertension: Secondary | ICD-10-CM | POA: Diagnosis not present

## 2014-12-15 DIAGNOSIS — G309 Alzheimer's disease, unspecified: Secondary | ICD-10-CM

## 2014-12-15 DIAGNOSIS — R197 Diarrhea, unspecified: Secondary | ICD-10-CM

## 2014-12-15 DIAGNOSIS — N3941 Urge incontinence: Secondary | ICD-10-CM

## 2014-12-15 DIAGNOSIS — R2689 Other abnormalities of gait and mobility: Secondary | ICD-10-CM

## 2014-12-15 NOTE — Progress Notes (Signed)
Failed clock test 

## 2014-12-15 NOTE — Progress Notes (Signed)
[name], female   DOB: [birth year], 79 y.o.   MRN: [id number]    Facility  [facility]    Place of Service:   OFFICE    Allergies  Allergen Reactions  . Erythromycin Hives  . Penicillins Hives and Rash  . Sulfa Antibiotics Hives  . Sulfonamide Derivatives Hives and Rash  . Wool Alcohol [Lanolin Alcohol] Hives  . Neomycin-Bacitracin Zn-Polymyx Rash  . Nitrofurantoin Other (See Comments)    Unknown   . Nitrofurantoin Monohyd Macro Other (See Comments)    Unknown     Chief Complaint  Patient presents with  . Medical Management of Chronic Issues    3 month follow-up    HPI:   Patient and family have increasing concerns over her safety at home. She has insurance that may provide assistance and they bring in forms to be filled out. She is increasingly forgetful. There are balance problems and strength issues that prohibit her from standing care an adequate meal. It has become nearly impossible for her to keep up with housework. She has increasing shortness of breath with minimal exertion. Incontinence issues remain a problem although they have improved since treatment of a recent urinary tract infection and institution of treatment with Myrbetriq.  Chronic obstructive pulmonary disease, unspecified COPD, unspecified chronic bronchitis type -  Spirometry was completed 12/08/14. It showed moderate obstructive airways disease with no significant response to bronchial dilators. Patient says she has not improved any by using Advair. She remained short of breath with exertion.  Diarrhea -  Improved  Essential hypertension - mild elevation in systolic blood pressure  Urge incontinence - episodes of urinary incontinence without dysuria have improved since being prescribed Myrbetriq on 11/23/14. Urine culture was positive for Klebsiella pneumonia 11/20/14. This was treated with ciprofloxacin.  Weight loss, non-intentional - regained 6 pounds in the last 3 weeks  Memory deficit - MMSE  23/30. Failed clock drawing.  Patient continues to be high risk for falling. She has had another couple of falls since her last visit.  Medications: Patient's Medications  New Prescriptions   No medications on file  Previous Medications   BLOOD GLUCOSE MONITORING SUPPL (FREESTYLE LITE) DEVI    Check blood sugar two times daily E11.9   CHOLECALCIFEROL (VITAMIN D PO)    Take 1,000 Units by mouth daily.    FLAXSEED, LINSEED, (FLAX SEED OIL PO)    Take 1,400 mg by mouth daily.    FLUTICASONE-SALMETEROL (ADVAIR DISKUS) 250-50 MCG/DOSE AEPB    One inhalation twice daily for breathing   GLUCOSE BLOOD TEST STRIP    Freestyle Lite, check blood sugar twice daily E11.9   LANCETS (FREESTYLE) LANCETS    Freestyle Lite, Check blood sugar two times daily E11.9   LINAGLIPTIN-METFORMIN HCL 2.08-998 MG TABS    One each morning to control diabetes   MECLIZINE (ANTIVERT) 25 MG TABLET    Take one tablet by mouth every 6 hours as needed for dizziness   MIRABEGRON ER (MYRBETRIQ) 25 MG TB24 TABLET    One daily to help bladder control   MULTIPLE VITAMINS-MINERALS (CENTRUM SILVER PO)    Take 1 tablet by mouth daily.    RIVAROXABAN (XARELTO) 20 MG TABS TABLET    Take 20 mg by mouth daily with supper.  Modified Medications   No medications on file  Discontinued Medications   CIPROFLOXACIN (CIPRO) 250 MG TABLET    Take one tablet by mouth twice daily for infection     Review of  Systems  Constitutional: Positive for fatigue. Negative for fever, chills, diaphoresis, activity change, appetite change and unexpected weight change.  HENT: Negative for congestion, ear discharge, ear pain, hearing loss, postnasal drip, rhinorrhea, sore throat, tinnitus, trouble swallowing and voice change.        Ears itch bilaterally along the ear canal.  Eyes: Negative for pain, redness, itching and visual disturbance.  Respiratory: Positive for cough and shortness of breath (less often). Negative for choking and wheezing.     Cardiovascular: Negative for chest pain, palpitations and leg swelling.  Gastrointestinal: Negative for nausea, abdominal pain, diarrhea, constipation and abdominal distention.       Loose stools and fecal incontinence.  Endocrine: Negative for cold intolerance, heat intolerance, polydipsia, polyphagia and polyuria.  Genitourinary: Positive for urgency (with incontinence). Negative for dysuria, frequency, hematuria, flank pain, vaginal discharge, difficulty urinating and pelvic pain.       Nocturia 3-4 x per night. Incontinent.  Musculoskeletal: Positive for gait problem. Negative for myalgias, back pain, arthralgias, neck pain and neck stiffness.  Skin: Negative for color change, pallor and rash.  Allergic/Immunologic: Negative.   Neurological: Positive for tremors and weakness. Negative for dizziness, seizures, syncope, numbness and headaches.       Feet burn at night. Increasing problems with memory. Balance difficulty.  Hematological: Negative for adenopathy. Does not bruise/bleed easily.       Anticoagulated with Xarelto due to chronic AF.  Psychiatric/Behavioral: Positive for sleep disturbance. Negative for suicidal ideas, hallucinations, behavioral problems, confusion, dysphoric mood and agitation. The patient is nervous/anxious.     Filed Vitals:   12/15/14 1405  BP: 140/90  Pulse: 83  Temp: 98 F (36.7 C)  TempSrc: Oral  Resp: 20  Height: 5\' 6"  (1.676 m)  Weight: 140 lb 3.2 oz (63.594 kg)  SpO2: 97%   Body mass index is 22.64 kg/(m^2).  Physical Exam  Constitutional: She appears well-developed and well-nourished. No distress.  frail  HENT:  Right Ear: External ear normal.  Left Ear: External ear normal.  Nose: Nose normal.  Mouth/Throat: Oropharynx is clear and moist. No oropharyngeal exudate.  Eyes: Conjunctivae and EOM are normal. Pupils are equal, round, and reactive to light. No scleral icterus.  Neck: No JVD present. No tracheal deviation present. No  thyromegaly present.  Cardiovascular: Normal rate, normal heart sounds and intact distal pulses.  Exam reveals no gallop and no friction rub.   No murmur heard. AF  Pulmonary/Chest: Effort normal. No respiratory distress. She has no wheezes. She has no rales. She exhibits no tenderness.  Pacemaker in the left upper chest.  Abdominal: She exhibits no distension and no mass. There is no tenderness.  Musculoskeletal: Normal range of motion. She exhibits no edema or tenderness.  Unstable gait. Using cane and walker. Generalized weakness.  Lymphadenopathy:    She has no cervical adenopathy.  Neurological: She is alert. No cranial nerve deficit. Coordination abnormal.  Forgetful. 12/15/14 MMSE 23/30. Failed clock drawing.  Skin: No rash noted. She is not diaphoretic. No erythema. No pallor.  Psychiatric: She has a normal mood and affect. Her behavior is normal. Judgment and thought content normal.     Labs reviewed: Clinical Support on 12/08/2014  Component Date Value Ref Range Status  . FVC-Pre 12/08/2014 1.70   Final  . FVC-%Pred-Pre 12/08/2014 59   Final  . FVC-Post 12/08/2014 1.71   Final  . FVC-%Pred-Post 12/08/2014 60   Final  . FVC-%Change-Post 12/08/2014 0   Final  . FEV1-Pre 12/08/2014  1.30   Final  . FEV1-%Pred-Pre 12/08/2014 60   Final  . FEV1-Post 12/08/2014 1.33   Final  . FEV1-%Pred-Post 12/08/2014 62   Final  . FEV1-%Change-Post 12/08/2014 2   Final  . FEV6-Pre 12/08/2014 1.70   Final  . FEV6-%Pred-Pre 12/08/2014 62   Final  . FEV6-Post 12/08/2014 1.71   Final  . FEV6-%Pred-Post 12/08/2014 63   Final  . FEV6-%Change-Post 12/08/2014 0   Final  . Pre FEV1/FVC ratio 12/08/2014 76   Final  . FEV1FVC-%Pred-Pre 12/08/2014 103   Final  . Post FEV1/FVC ratio 12/08/2014 78   Final  . FEV1FVC-%Change-Post 12/08/2014 2   Final  . Pre FEV6/FVC Ratio 12/08/2014 100   Final  . FEV6FVC-%Pred-Pre 12/08/2014 105   Final  . Post FEV6/FVC ratio 12/08/2014 100   Final  .  FEV6FVC-%Pred-Post 12/08/2014 105   Final  . FEF 25-75 Pre 12/08/2014 1.04   Final  . FEF2575-%Pred-Pre 12/08/2014 64   Final  . FEF 25-75 Post 12/08/2014 1.22   Final  . FEF2575-%Pred-Post 12/08/2014 76   Final  . FEF2575-%Change-Post 12/08/2014 17   Final  . DLCO unc 12/08/2014 12.68   Final  . DLCO unc % pred 12/08/2014 49   Final  . DL/VA 12/08/2014 4.17   Final  . DL/VA % pred 12/08/2014 84   Final  Office Visit on 10/05/2014  Component Date Value Ref Range Status  . Date Time Interrogation Session 10/05/2014 [id number]   Preliminary  . Pulse Generator Manufacturer 10/05/2014 MERM   Preliminary  . Pulse Gen Model 10/05/2014 ADSR01 Adapta   Preliminary  . Pulse Gen Serial Number 10/05/2014 [id number]   Preliminary  . Implantable Pulse Generator Type 10/05/2014 Implantable Pulse Generator   Preliminary  . Implantable Pulse Generator Implan* 10/05/2014 [id number]   Preliminary  . Lead Channel Setting Sensing Sensi* 10/05/2014 4.00   Preliminary  . Lead Channel Setting Pacing Pulse * 10/05/2014 0.40   Preliminary  . Lead Channel Setting Pacing Amplit* 10/05/2014 2.500   Preliminary  . Lead Channel Impedance Value 10/05/2014 0   Preliminary  . Lead Channel Impedance Value 10/05/2014 583   Preliminary  . Lead Channel Sensing Intrinsic Amp* 10/05/2014 8.00   Preliminary  . Lead Channel Pacing Threshold Ampl* 10/05/2014 0.500   Preliminary  . Lead Channel Pacing Threshold Puls* 10/05/2014 0.40   Preliminary  . Battery Status 10/05/2014 Unknown   Preliminary  . Battery Remaining Longevity 10/05/2014 74   Preliminary  . Battery Voltage 10/05/2014 2.77   Preliminary  . Battery Impedance 10/05/2014 570   Preliminary  . Loletha Grayer Statistic RV Percent Paced 10/05/2014 97   Preliminary  . Eval Rhythm 10/05/2014 Vs 52   Preliminary     Assessment/Plan  1. Chronic obstructive pulmonary disease, unspecified COPD, unspecified chronic bronchitis type Discontinue Advair Offered  pulmonary consultation, but she does not want this at this time. Discussed oxygen, but O2 sats are high enough this does not seem warranted.  2. Diarrhea Improved  3. Essential hypertension Mild elevation in systolic blood pressure. Continue to monitor. No change in medications at this time.  4. Urge incontinence Continue Myrbetriq  5. Weight loss, non-intentional Improving  6. Memory deficit Consider starting donepezil  7. Anticoagulation Despite risk of falling, I believe that anticoagulation is still warranted.Marland Kitchen  Approximately 40 minutes were spent completing forms regarding her current condition and need for assistance at home.

## 2014-12-16 ENCOUNTER — Telehealth: Payer: Self-pay

## 2014-12-16 NOTE — Telephone Encounter (Signed)
Call [employer] [phone] for prior authorization for Myrbetriq, to fax form

## 2014-12-17 DIAGNOSIS — Z7901 Long term (current) use of anticoagulants: Secondary | ICD-10-CM | POA: Insufficient documentation

## 2014-12-17 NOTE — Telephone Encounter (Signed)
Form received, completed and faxed back. Awaiting response from insurance company

## 2014-12-18 DIAGNOSIS — Z9181 History of falling: Secondary | ICD-10-CM

## 2014-12-18 DIAGNOSIS — S50311D Abrasion of right elbow, subsequent encounter: Secondary | ICD-10-CM | POA: Diagnosis not present

## 2014-12-18 DIAGNOSIS — M6281 Muscle weakness (generalized): Secondary | ICD-10-CM

## 2014-12-18 DIAGNOSIS — R2689 Other abnormalities of gait and mobility: Secondary | ICD-10-CM

## 2014-12-21 ENCOUNTER — Telehealth: Payer: Self-pay | Admitting: *Deleted

## 2014-12-21 NOTE — Telephone Encounter (Signed)
Savra with [facility] called and stated that she needed to get a verbal order for patient for Nursing. Patient has fallen 5 times in the last 2 days and now has a wound on her forearm and they want Nursing. Verbal order given.

## 2014-12-23 ENCOUNTER — Telehealth: Payer: Self-pay | Admitting: Internal Medicine

## 2014-12-23 NOTE — Telephone Encounter (Signed)
Patient has declined having Bone Density at this time   [facility] for data entry

## 2014-12-24 NOTE — Telephone Encounter (Signed)
Go ahead with referral for home nursing to take care of wound.

## 2014-12-24 NOTE — Telephone Encounter (Signed)
Noted  

## 2014-12-26 ENCOUNTER — Emergency Department (HOSPITAL_COMMUNITY): Payer: Medicare Other

## 2014-12-26 ENCOUNTER — Inpatient Hospital Stay (HOSPITAL_COMMUNITY)
Admission: EM | Admit: 2014-12-26 | Discharge: 2014-12-31 | DRG: 558 | Disposition: A | Discharge status: Skilled Nursing Facility | Payer: Medicare Other | Attending: Internal Medicine | Admitting: Internal Medicine

## 2014-12-26 ENCOUNTER — Encounter (HOSPITAL_COMMUNITY): Payer: Self-pay | Admitting: *Deleted

## 2014-12-26 DIAGNOSIS — Z66 Do not resuscitate: Secondary | ICD-10-CM | POA: Diagnosis present

## 2014-12-26 DIAGNOSIS — R269 Unspecified abnormalities of gait and mobility: Secondary | ICD-10-CM | POA: Diagnosis present

## 2014-12-26 DIAGNOSIS — I081 Rheumatic disorders of both mitral and tricuspid valves: Secondary | ICD-10-CM | POA: Diagnosis present

## 2014-12-26 DIAGNOSIS — D649 Anemia, unspecified: Secondary | ICD-10-CM | POA: Diagnosis present

## 2014-12-26 DIAGNOSIS — Z79899 Other long term (current) drug therapy: Secondary | ICD-10-CM

## 2014-12-26 DIAGNOSIS — I200 Unstable angina: Secondary | ICD-10-CM | POA: Diagnosis present

## 2014-12-26 DIAGNOSIS — Z7901 Long term (current) use of anticoagulants: Secondary | ICD-10-CM

## 2014-12-26 DIAGNOSIS — E119 Type 2 diabetes mellitus without complications: Secondary | ICD-10-CM

## 2014-12-26 DIAGNOSIS — I10 Essential (primary) hypertension: Secondary | ICD-10-CM | POA: Diagnosis present

## 2014-12-26 DIAGNOSIS — I482 Chronic atrial fibrillation, unspecified: Secondary | ICD-10-CM | POA: Diagnosis present

## 2014-12-26 DIAGNOSIS — E785 Hyperlipidemia, unspecified: Secondary | ICD-10-CM | POA: Diagnosis present

## 2014-12-26 DIAGNOSIS — N179 Acute kidney failure, unspecified: Secondary | ICD-10-CM | POA: Diagnosis present

## 2014-12-26 DIAGNOSIS — Z96642 Presence of left artificial hip joint: Secondary | ICD-10-CM | POA: Diagnosis present

## 2014-12-26 DIAGNOSIS — R0609 Other forms of dyspnea: Secondary | ICD-10-CM

## 2014-12-26 DIAGNOSIS — R7989 Other specified abnormal findings of blood chemistry: Secondary | ICD-10-CM

## 2014-12-26 DIAGNOSIS — R296 Repeated falls: Secondary | ICD-10-CM | POA: Diagnosis present

## 2014-12-26 DIAGNOSIS — Z7951 Long term (current) use of inhaled steroids: Secondary | ICD-10-CM

## 2014-12-26 DIAGNOSIS — E8809 Other disorders of plasma-protein metabolism, not elsewhere classified: Secondary | ICD-10-CM | POA: Diagnosis present

## 2014-12-26 DIAGNOSIS — E860 Dehydration: Secondary | ICD-10-CM | POA: Diagnosis present

## 2014-12-26 DIAGNOSIS — W19XXXA Unspecified fall, initial encounter: Secondary | ICD-10-CM

## 2014-12-26 DIAGNOSIS — Z87891 Personal history of nicotine dependence: Secondary | ICD-10-CM

## 2014-12-26 DIAGNOSIS — F0390 Unspecified dementia without behavioral disturbance: Secondary | ICD-10-CM | POA: Diagnosis present

## 2014-12-26 DIAGNOSIS — J449 Chronic obstructive pulmonary disease, unspecified: Secondary | ICD-10-CM | POA: Diagnosis present

## 2014-12-26 DIAGNOSIS — Z950 Presence of cardiac pacemaker: Secondary | ICD-10-CM

## 2014-12-26 DIAGNOSIS — M6282 Rhabdomyolysis: Secondary | ICD-10-CM | POA: Diagnosis not present

## 2014-12-26 DIAGNOSIS — R778 Other specified abnormalities of plasma proteins: Secondary | ICD-10-CM

## 2014-12-26 DIAGNOSIS — R32 Unspecified urinary incontinence: Secondary | ICD-10-CM | POA: Diagnosis present

## 2014-12-26 LAB — URINALYSIS, ROUTINE W REFLEX MICROSCOPIC
Glucose, UA: NEGATIVE mg/dL
Ketones, ur: 15 mg/dL — AB
Leukocytes, UA: NEGATIVE
NITRITE: NEGATIVE
PROTEIN: 100 mg/dL — AB
SPECIFIC GRAVITY, URINE: 1.027 (ref 1.005–1.030)
UROBILINOGEN UA: 0.2 mg/dL (ref 0.0–1.0)
pH: 5 (ref 5.0–8.0)

## 2014-12-26 LAB — CBC WITH DIFFERENTIAL/PLATELET
BASOS PCT: 0 % (ref 0–1)
Basophils Absolute: 0 10*3/uL (ref 0.0–0.1)
Eosinophils Absolute: 0 10*3/uL (ref 0.0–0.7)
Eosinophils Relative: 0 % (ref 0–5)
HCT: 40.4 % (ref 36.0–46.0)
HEMOGLOBIN: 13.7 g/dL (ref 12.0–15.0)
LYMPHS PCT: 7 % — AB (ref 12–46)
Lymphs Abs: 1 10*3/uL (ref 0.7–4.0)
MCH: 29.8 pg (ref 26.0–34.0)
MCHC: 33.9 g/dL (ref 30.0–36.0)
MCV: 87.8 fL (ref 78.0–100.0)
Monocytes Absolute: 1.5 10*3/uL — ABNORMAL HIGH (ref 0.1–1.0)
Monocytes Relative: 10 % (ref 3–12)
NEUTROS PCT: 83 % — AB (ref 43–77)
Neutro Abs: 12.6 10*3/uL — ABNORMAL HIGH (ref 1.7–7.7)
Platelets: 267 10*3/uL (ref 150–400)
RBC: 4.6 MIL/uL (ref 3.87–5.11)
RDW: 13.1 % (ref 11.5–15.5)
WBC: 15.2 10*3/uL — ABNORMAL HIGH (ref 4.0–10.5)

## 2014-12-26 LAB — COMPREHENSIVE METABOLIC PANEL
ALBUMIN: 3.9 g/dL (ref 3.5–5.0)
ALK PHOS: 84 U/L (ref 38–126)
ALT: 49 U/L (ref 14–54)
AST: 124 U/L — AB (ref 15–41)
Anion gap: 12 (ref 5–15)
BILIRUBIN TOTAL: 1.2 mg/dL (ref 0.3–1.2)
BUN: 23 mg/dL — AB (ref 6–20)
CALCIUM: 9.7 mg/dL (ref 8.9–10.3)
CO2: 22 mmol/L (ref 22–32)
CREATININE: 1.06 mg/dL — AB (ref 0.44–1.00)
Chloride: 105 mmol/L (ref 101–111)
GFR calc Af Amer: 57 mL/min — ABNORMAL LOW (ref >60)
GFR calc non Af Amer: 49 mL/min — ABNORMAL LOW (ref >60)
GLUCOSE: 160 mg/dL — AB (ref 65–99)
POTASSIUM: 3.9 mmol/L (ref 3.5–5.1)
Sodium: 139 mmol/L (ref 135–145)
TOTAL PROTEIN: 7.2 g/dL (ref 6.5–8.1)

## 2014-12-26 LAB — URINE MICROSCOPIC-ADD ON

## 2014-12-26 MED ORDER — SODIUM CHLORIDE 0.9 % IV BOLUS (SEPSIS)
1000.0000 mL | Freq: Once | INTRAVENOUS | Status: AC
Start: 2014-12-26 — End: 2014-12-27
  Administered 2014-12-27: 1000 mL via INTRAVENOUS

## 2014-12-26 NOTE — ED Notes (Signed)
Patient presents via EMS.  Report is that she fell down the steps this morning about 6am while taking the trash out.  Stated she laid there until her daughter got there to help her up (called her daughter to help her get up).  Then fell again today about 1pm inside on the carpet.  Laid there until about 7pm until the neighbor came to see her and found her.  Patients speech is slow and somewhat slurred but the daughter reported that her speech is normal (patient reports the same)  EMS CBG 162 BP 130/74 HR 78 R 18 alert and oriented  Has been having freq falls per EMS per daughter

## 2014-12-27 ENCOUNTER — Encounter (HOSPITAL_COMMUNITY): Payer: Self-pay | Admitting: Internal Medicine

## 2014-12-27 ENCOUNTER — Inpatient Hospital Stay (HOSPITAL_COMMUNITY): Payer: Medicare Other

## 2014-12-27 ENCOUNTER — Emergency Department (HOSPITAL_COMMUNITY): Payer: Medicare Other

## 2014-12-27 DIAGNOSIS — R269 Unspecified abnormalities of gait and mobility: Secondary | ICD-10-CM | POA: Diagnosis not present

## 2014-12-27 DIAGNOSIS — Z66 Do not resuscitate: Secondary | ICD-10-CM | POA: Diagnosis not present

## 2014-12-27 DIAGNOSIS — Z7951 Long term (current) use of inhaled steroids: Secondary | ICD-10-CM | POA: Diagnosis not present

## 2014-12-27 DIAGNOSIS — I10 Essential (primary) hypertension: Secondary | ICD-10-CM | POA: Diagnosis not present

## 2014-12-27 DIAGNOSIS — Z79899 Other long term (current) drug therapy: Secondary | ICD-10-CM | POA: Diagnosis not present

## 2014-12-27 DIAGNOSIS — Z87891 Personal history of nicotine dependence: Secondary | ICD-10-CM | POA: Diagnosis not present

## 2014-12-27 DIAGNOSIS — Z7901 Long term (current) use of anticoagulants: Secondary | ICD-10-CM | POA: Diagnosis not present

## 2014-12-27 DIAGNOSIS — E119 Type 2 diabetes mellitus without complications: Secondary | ICD-10-CM | POA: Diagnosis not present

## 2014-12-27 DIAGNOSIS — Z96642 Presence of left artificial hip joint: Secondary | ICD-10-CM | POA: Diagnosis not present

## 2014-12-27 DIAGNOSIS — R7989 Other specified abnormal findings of blood chemistry: Secondary | ICD-10-CM | POA: Diagnosis present

## 2014-12-27 DIAGNOSIS — W19XXXA Unspecified fall, initial encounter: Secondary | ICD-10-CM | POA: Diagnosis not present

## 2014-12-27 DIAGNOSIS — I482 Chronic atrial fibrillation, unspecified: Secondary | ICD-10-CM | POA: Diagnosis present

## 2014-12-27 DIAGNOSIS — I081 Rheumatic disorders of both mitral and tricuspid valves: Secondary | ICD-10-CM | POA: Diagnosis not present

## 2014-12-27 DIAGNOSIS — E860 Dehydration: Secondary | ICD-10-CM | POA: Diagnosis not present

## 2014-12-27 DIAGNOSIS — J410 Simple chronic bronchitis: Secondary | ICD-10-CM | POA: Diagnosis not present

## 2014-12-27 DIAGNOSIS — R296 Repeated falls: Secondary | ICD-10-CM | POA: Diagnosis not present

## 2014-12-27 DIAGNOSIS — E8809 Other disorders of plasma-protein metabolism, not elsewhere classified: Secondary | ICD-10-CM | POA: Diagnosis not present

## 2014-12-27 DIAGNOSIS — E785 Hyperlipidemia, unspecified: Secondary | ICD-10-CM | POA: Diagnosis not present

## 2014-12-27 DIAGNOSIS — F0390 Unspecified dementia without behavioral disturbance: Secondary | ICD-10-CM | POA: Diagnosis present

## 2014-12-27 DIAGNOSIS — D649 Anemia, unspecified: Secondary | ICD-10-CM | POA: Diagnosis not present

## 2014-12-27 DIAGNOSIS — T796XXD Traumatic ischemia of muscle, subsequent encounter: Secondary | ICD-10-CM | POA: Diagnosis not present

## 2014-12-27 DIAGNOSIS — J449 Chronic obstructive pulmonary disease, unspecified: Secondary | ICD-10-CM | POA: Diagnosis not present

## 2014-12-27 DIAGNOSIS — M6282 Rhabdomyolysis: Secondary | ICD-10-CM | POA: Diagnosis present

## 2014-12-27 DIAGNOSIS — N179 Acute kidney failure, unspecified: Secondary | ICD-10-CM | POA: Diagnosis not present

## 2014-12-27 DIAGNOSIS — Z950 Presence of cardiac pacemaker: Secondary | ICD-10-CM | POA: Diagnosis not present

## 2014-12-27 DIAGNOSIS — I200 Unstable angina: Secondary | ICD-10-CM | POA: Diagnosis not present

## 2014-12-27 DIAGNOSIS — R32 Unspecified urinary incontinence: Secondary | ICD-10-CM | POA: Diagnosis not present

## 2014-12-27 DIAGNOSIS — T796XXS Traumatic ischemia of muscle, sequela: Secondary | ICD-10-CM

## 2014-12-27 LAB — CBC WITH DIFFERENTIAL/PLATELET
Basophils Absolute: 0.1 10*3/uL (ref 0.0–0.1)
Basophils Relative: 1 % (ref 0–1)
Eosinophils Absolute: 0.1 10*3/uL (ref 0.0–0.7)
Eosinophils Relative: 1 % (ref 0–5)
HEMATOCRIT: 34.8 % — AB (ref 36.0–46.0)
HEMOGLOBIN: 11.7 g/dL — AB (ref 12.0–15.0)
LYMPHS ABS: 2.1 10*3/uL (ref 0.7–4.0)
Lymphocytes Relative: 21 % (ref 12–46)
MCH: 30 pg (ref 26.0–34.0)
MCHC: 33.6 g/dL (ref 30.0–36.0)
MCV: 89.2 fL (ref 78.0–100.0)
MONOS PCT: 11 % (ref 3–12)
Monocytes Absolute: 1.1 10*3/uL — ABNORMAL HIGH (ref 0.1–1.0)
NEUTROS ABS: 6.5 10*3/uL (ref 1.7–7.7)
NEUTROS PCT: 66 % (ref 43–77)
Platelets: 198 10*3/uL (ref 150–400)
RBC: 3.9 MIL/uL (ref 3.87–5.11)
RDW: 13.6 % (ref 11.5–15.5)
WBC: 9.8 10*3/uL (ref 4.0–10.5)

## 2014-12-27 LAB — COMPREHENSIVE METABOLIC PANEL
ALK PHOS: 63 U/L (ref 38–126)
ALT: 41 U/L (ref 14–54)
ANION GAP: 9 (ref 5–15)
AST: 93 U/L — ABNORMAL HIGH (ref 15–41)
Albumin: 2.9 g/dL — ABNORMAL LOW (ref 3.5–5.0)
BILIRUBIN TOTAL: 0.9 mg/dL (ref 0.3–1.2)
BUN: 24 mg/dL — ABNORMAL HIGH (ref 6–20)
CALCIUM: 8.3 mg/dL — AB (ref 8.9–10.3)
CO2: 22 mmol/L (ref 22–32)
Chloride: 108 mmol/L (ref 101–111)
Creatinine, Ser: 0.95 mg/dL (ref 0.44–1.00)
GFR, EST NON AFRICAN AMERICAN: 56 mL/min — AB (ref >60)
Glucose, Bld: 124 mg/dL — ABNORMAL HIGH (ref 65–99)
Potassium: 3.6 mmol/L (ref 3.5–5.1)
Sodium: 139 mmol/L (ref 135–145)
TOTAL PROTEIN: 5.4 g/dL — AB (ref 6.5–8.1)

## 2014-12-27 LAB — TROPONIN I
TROPONIN I: 0.51 ng/mL — AB (ref <0.031)
TROPONIN I: 0.32 ng/mL — AB (ref <0.031)
Troponin I: 0.49 ng/mL — ABNORMAL HIGH (ref <0.031)
Troponin I: 0.28 ng/mL — ABNORMAL HIGH (ref <0.031)

## 2014-12-27 LAB — GLUCOSE, CAPILLARY
GLUCOSE-CAPILLARY: 244 mg/dL — AB (ref 65–99)
GLUCOSE-CAPILLARY: 119 mg/dL — AB (ref 65–99)
Glucose-Capillary: 108 mg/dL — ABNORMAL HIGH (ref 65–99)
Glucose-Capillary: 137 mg/dL — ABNORMAL HIGH (ref 65–99)

## 2014-12-27 LAB — CK
CK TOTAL: 2540 U/L — AB (ref 38–234)
Total CK: 1860 U/L — ABNORMAL HIGH (ref 38–234)

## 2014-12-27 LAB — CBG MONITORING, ED: GLUCOSE-CAPILLARY: 147 mg/dL — AB (ref 65–99)

## 2014-12-27 MED ORDER — SODIUM CHLORIDE 0.9 % IV SOLN
Freq: Once | INTRAVENOUS | Status: AC
  Administered 2014-12-27: 03:00:00 via INTRAVENOUS

## 2014-12-27 MED ORDER — ENSURE ENLIVE PO LIQD
237.0000 mL | Freq: Two times a day (BID) | ORAL | Status: DC
  Administered 2014-12-27 (×2): 237 mL via ORAL

## 2014-12-27 MED ORDER — RIVAROXABAN 20 MG PO TABS: 20.0000 mg | ORAL_TABLET | Freq: Every day | ORAL | Status: DC

## 2014-12-27 MED ORDER — IPRATROPIUM-ALBUTEROL 0.5-2.5 (3) MG/3ML IN SOLN
3.0000 mL | Freq: Once | RESPIRATORY_TRACT | Status: AC
  Administered 2014-12-27: 3 mL via RESPIRATORY_TRACT
  Filled 2014-12-27: qty 3

## 2014-12-27 MED ORDER — MOMETASONE FURO-FORMOTEROL FUM 100-5 MCG/ACT IN AERO
2.0000 | INHALATION_SPRAY | Freq: Two times a day (BID) | RESPIRATORY_TRACT | Status: DC
  Administered 2014-12-27 – 2014-12-30 (×6): 2 via RESPIRATORY_TRACT
  Filled 2014-12-27: qty 8.8

## 2014-12-27 MED ORDER — MIRABEGRON ER 25 MG PO TB24
25.0000 mg | ORAL_TABLET | Freq: Every day | ORAL | Status: DC
  Administered 2014-12-27 – 2014-12-31 (×5): 25 mg via ORAL
  Filled 2014-12-27 (×5): qty 1

## 2014-12-27 MED ORDER — INSULIN ASPART 100 UNIT/ML ~~LOC~~ SOLN
0.0000 [IU] | Freq: Three times a day (TID) | SUBCUTANEOUS | Status: DC
  Administered 2014-12-27: 3 [IU] via SUBCUTANEOUS
  Administered 2014-12-28: 2 [IU] via SUBCUTANEOUS
  Administered 2014-12-28 – 2014-12-29 (×2): 1 [IU] via SUBCUTANEOUS
  Administered 2014-12-29: 2 [IU] via SUBCUTANEOUS
  Administered 2014-12-29: 1 [IU] via SUBCUTANEOUS
  Administered 2014-12-30: 2 [IU] via SUBCUTANEOUS
  Administered 2014-12-30: 1 [IU] via SUBCUTANEOUS
  Administered 2014-12-30: 2 [IU] via SUBCUTANEOUS
  Administered 2014-12-31: 1 [IU] via SUBCUTANEOUS

## 2014-12-27 MED ORDER — RIVAROXABAN 15 MG PO TABS
15.0000 mg | ORAL_TABLET | Freq: Every day | ORAL | Status: DC
  Administered 2014-12-27 – 2014-12-29 (×3): 15 mg via ORAL
  Filled 2014-12-27 (×3): qty 1

## 2014-12-27 MED ORDER — SODIUM CHLORIDE 0.9 % IV SOLN
INTRAVENOUS | Status: DC
  Administered 2014-12-27 – 2014-12-28 (×3): via INTRAVENOUS

## 2014-12-27 MED ORDER — ASPIRIN 81 MG PO CHEW
324.0000 mg | CHEWABLE_TABLET | Freq: Once | ORAL | Status: AC
  Administered 2014-12-27: 324 mg via ORAL
  Filled 2014-12-27: qty 4

## 2014-12-27 MED ORDER — TETANUS-DIPHTH-ACELL PERTUSSIS 5-2.5-18.5 LF-MCG/0.5 IM SUSP
0.5000 mL | Freq: Once | INTRAMUSCULAR | Status: AC
  Administered 2014-12-27: 0.5 mL via INTRAMUSCULAR
  Filled 2014-12-27 (×2): qty 0.5

## 2014-12-27 MED ORDER — LINAGLIPTIN 5 MG PO TABS
5.0000 mg | ORAL_TABLET | Freq: Every day | ORAL | Status: DC
  Administered 2014-12-27 – 2014-12-31 (×4): 5 mg via ORAL
  Filled 2014-12-27 (×6): qty 1

## 2014-12-27 MED ORDER — MECLIZINE HCL 25 MG PO TABS: 25.0000 mg | ORAL_TABLET | Freq: Three times a day (TID) | ORAL | Status: DC | PRN

## 2014-12-27 MED ORDER — ACETAMINOPHEN 325 MG PO TABS: 650.0000 mg | ORAL_TABLET | Freq: Four times a day (QID) | ORAL | Status: DC | PRN

## 2014-12-27 MED ORDER — ONDANSETRON HCL 4 MG PO TABS: 4.0000 mg | ORAL_TABLET | Freq: Four times a day (QID) | ORAL | Status: DC | PRN

## 2014-12-27 MED ORDER — TETANUS-DIPHTH-ACELL PERTUSSIS 5-2.5-18.5 LF-MCG/0.5 IM SUSP
0.5000 mL | Freq: Once | INTRAMUSCULAR | Status: DC
  Filled 2014-12-27: qty 0.5

## 2014-12-27 MED ORDER — ACETAMINOPHEN 650 MG RE SUPP: 650.0000 mg | Freq: Four times a day (QID) | RECTAL | Status: DC | PRN

## 2014-12-27 MED ORDER — ONDANSETRON HCL 4 MG/2ML IJ SOLN: 4.0000 mg | Freq: Four times a day (QID) | INTRAMUSCULAR | Status: DC | PRN

## 2014-12-27 NOTE — Progress Notes (Signed)
Paged admitting doctor Hal Hope for orders earlier.

## 2014-12-27 NOTE — Progress Notes (Signed)
Patient admitted after midnight- please see H&P.   1. Fall with rhabdomyolysis - continue gentle hydration. Closely follow CK levels intake output. 2. Acute renal failure - probably from dehydration vs rhabdo. Gently hydrated and closely follow metabolic panel intake output. 3. Diabetes mellitus type 2 controlled - hold metformin but continuing Linagliptin. 4. Elevated troponin - patient is a poor historian but denies any chest pain. Cycle cardiac markers.  Cardiology consult- stress test in AM 5. Hypertension - continue present medication. 6. COPD Presley not wheezing. 7. Chronic atrial fibrillation status post pacemaker placement - patient's chads 2 vasc score is more than 2. Patient is on Xarelto which will be continued. Patient's rate is controlled at this time. 8. Patient has bilateral elbow and knee skin tear from fall - will order tetanus booster and x-ray pelvis is negative for fracture  Eulogio Bear DO

## 2014-12-27 NOTE — ED Notes (Signed)
Pt ambulatory to restroom with assistance of walker and one staff member. Steady gait noted. Pt noted to be more short of breath during short ambulations. Sats remain 98% on room air. MD made aware

## 2014-12-27 NOTE — ED Notes (Addendum)
Daughter requests that the cardiologist calls her or her brother after seeing the patient to relay the information given to her.

## 2014-12-27 NOTE — Progress Notes (Signed)
*  PRELIMINARY RESULTS* Echocardiogram 2D Echocardiogram has been performed.  Leavy Cella 12/27/2014, 3:16 PM

## 2014-12-27 NOTE — ED Notes (Signed)
Pt c/o frequent falls at home. Pt lives alone with home health care for physical therapy due to generalized weakness. Pt c/o weakness to bilateral legs when ambulating. Daughter reports pt fell at 0100 in the morning, laid on the ground until 7am before calling for help. Pt received skin tear to L elbow from first fall. Later this afternoon, pt fell again. When the daughter was unable to contact patient, the neighbor went to check on patient, finding her lying in the hallway. Pt alert and oriented x 3, speech is slow but at baseline per daughter. Pt c/o back pain only.

## 2014-12-27 NOTE — Progress Notes (Signed)
Received pt report from Brittany,RN-ED. 

## 2014-12-27 NOTE — Consult Note (Signed)
Referring Physician: Hospitalist/Dr. Jeanmarie Hubert  Erin Vang is an 79 y.o. female.                       Chief Complaint: Abnormal Cardiac enzymes post fall  HPI: 79 y.o. female with a PMHx of chronic atrial fibrillation/tachybradycardia syndrome, status post AV nodal ablation and pacemaker placement (on Xarelto for CHADS2Vasc of 4), HTN, osteoarthritis, COPD, and DM who presents to the Emergency Department her after several falls in last several days. Her cardiac enzymes are minimally elevated and total CK was 2540. CT head and Chest X-ray were unremarkable.  Past Medical History  Diagnosis Date  . Hypertension   . Osteoarthritis   . Hypercholesterolemia   . Paroxysmal atrial fibrillation     CHADS2: 2; managed with rate control and anticoagulation; s/p AVN ablation 04/2013  . Syncope and collapse     When patient stands up real quickly  . Normal cardiac stress test     a.  lexiscan MV - EF 72%, No ischemia  . Anemia     ? of lab error vs GI bleed  . COPD (chronic obstructive pulmonary disease)   . Chronic anticoagulation     On Xarelto  . Exertional dyspnea   . Type II diabetes mellitus   . History of blood transfusion     "wasn't needed; had some bad lab results" (03-09-12)  . Depression   . Prolonged grief reaction     "husband died 1 yr ago" (Mar 09, 2012)  . Urinary incontinence   . Benign paroxysmal positional vertigo 07/22/2012  . Tinnitus of left ear 07/22/2012  . Unspecified constipation   . Insomnia, unspecified   . Anxiety state, unspecified       Past Surgical History  Procedure Laterality Date  . Appendectomy  1952  . Cervical disc surgery  04/2009  . Hip arthroplasty  11/08/2011    Procedure: ARTHROPLASTY BIPOLAR HIP;  Surgeon: Mauri Pole, MD;  Location: [place];  Service: Orthopedics;  Laterality: Left;  . Insert / replace / remove pacemaker  09-Mar-2012  . Tubal ligation  1974  . Cholecystectomy  1984  . Polypectomy  11/21/1999    Endometrial polyps  . Eye  surgery Right 03/2006    Cataract surgery  . Eye surgery  11/2004    Lid surgery  . Ablation  05/22/2013    AVN ablation by Dr Caryl Comes  . Permanent pacemaker insertion N/A 2012/03/09    Procedure: PERMANENT PACEMAKER INSERTION;  Surgeon: Deboraha Sprang, MD;  Location: [facility] CATH LAB;  Service: Cardiovascular;  Laterality: N/A;  . Av node ablation N/A 05/22/2013    Procedure: AV NODE ABLATION;  Surgeon: Deboraha Sprang, MD;  Location: [facility] CATH LAB;  Service: Cardiovascular;  Laterality: N/A;    Family History  Problem Relation Age of Onset  . Arthritis Mother   . Kidney nephrosis Mother   . Ulcers Mother     Peptic  . Heart failure Mother     CHF  . Cancer Father     Kidney  . Early death Son     Self-inflicted gunshot  . Heart attack Neg Hx    Social History:  reports that she quit smoking about 28 years ago. Her smoking use included Cigarettes. She has a 70 pack-year smoking history. She has never used smokeless tobacco. She reports that she does not drink alcohol or use illicit drugs.  Allergies:  Allergies  Allergen Reactions  .  Erythromycin Hives  . Penicillins Hives and Rash  . Sulfa Antibiotics Hives  . Sulfonamide Derivatives Hives and Rash  . Wool Alcohol [Lanolin Alcohol] Hives  . Neomycin-Bacitracin Zn-Polymyx Rash  . Nitrofurantoin Other (See Comments)    Unknown   . Nitrofurantoin Monohyd Macro Other (See Comments)    Unknown     Medications Prior to Admission  Medication Sig Dispense Refill  . Blood Glucose Monitoring Suppl (FREESTYLE LITE) DEVI Check blood sugar two times daily E11.9 1 each 0  . Cholecalciferol (VITAMIN D PO) Take 1,000 Units by mouth daily.     . Flaxseed, Linseed, (FLAX SEED OIL PO) Take 1,400 mg by mouth daily.     . Fluticasone-Salmeterol (ADVAIR DISKUS) 250-50 MCG/DOSE AEPB One inhalation twice daily for breathing 3 each 4  . glucose blood test strip Freestyle Lite, check blood sugar twice daily E11.9 200 each 3  . Lancets (FREESTYLE) lancets  Freestyle Lite, Check blood sugar two times daily E11.9 200 each 3  . Linagliptin-Metformin HCl 2.08-998 MG TABS One each morning to control diabetes 90 tablet 4  . meclizine (ANTIVERT) 25 MG tablet Take one tablet by mouth every 6 hours as needed for dizziness 50 tablet 5  . mirabegron ER (MYRBETRIQ) 25 MG TB24 tablet One daily to help bladder control 30 tablet 5  . Multiple Vitamins-Minerals (CENTRUM SILVER PO) Take 1 tablet by mouth daily.     . rivaroxaban (XARELTO) 20 MG TABS tablet Take 20 mg by mouth daily with supper.      Results for orders placed or performed during the hospital encounter of 12/26/14 (from the past 48 hour(s))  Urinalysis, Routine w reflex microscopic (not at [facility])     Status: Abnormal   Collection Time: 12/26/14  8:25 PM  Result Value Ref Range   Color, Urine YELLOW YELLOW   APPearance CLOUDY (A) CLEAR   Specific Gravity, Urine 1.027 1.005 - 1.030   pH 5.0 5.0 - 8.0   Glucose, UA NEGATIVE NEGATIVE mg/dL   Hgb urine dipstick LARGE (A) NEGATIVE   Bilirubin Urine SMALL (A) NEGATIVE   Ketones, ur 15 (A) NEGATIVE mg/dL   Protein, ur 100 (A) NEGATIVE mg/dL   Urobilinogen, UA 0.2 0.0 - 1.0 mg/dL   Nitrite NEGATIVE NEGATIVE   Leukocytes, UA NEGATIVE NEGATIVE  Urine microscopic-add on     Status: Abnormal   Collection Time: 12/26/14  8:25 PM  Result Value Ref Range   Squamous Epithelial / LPF FEW (A) RARE   WBC, UA 3-6 <3 WBC/hpf   RBC / HPF 0-2 <3 RBC/hpf   Bacteria, UA FEW (A) RARE   Casts HYALINE CASTS (A) NEGATIVE  CBC with Differential     Status: Abnormal   Collection Time: 12/26/14  8:37 PM  Result Value Ref Range   WBC 15.2 (H) 4.0 - 10.5 K/uL   RBC 4.60 3.87 - 5.11 MIL/uL   Hemoglobin 13.7 12.0 - 15.0 g/dL   HCT 40.4 36.0 - 46.0 %   MCV 87.8 78.0 - 100.0 fL   MCH 29.8 26.0 - 34.0 pg   MCHC 33.9 30.0 - 36.0 g/dL   RDW 13.1 11.5 - 15.5 %   Platelets 267 150 - 400 K/uL   Neutrophils Relative % 83 (H) 43 - 77 %   Neutro Abs 12.6 (H) 1.7 - 7.7 K/uL    Lymphocytes Relative 7 (L) 12 - 46 %   Lymphs Abs 1.0 0.7 - 4.0 K/uL   Monocytes Relative 10 3 -  12 %   Monocytes Absolute 1.5 (H) 0.1 - 1.0 K/uL   Eosinophils Relative 0 0 - 5 %   Eosinophils Absolute 0.0 0.0 - 0.7 K/uL   Basophils Relative 0 0 - 1 %   Basophils Absolute 0.0 0.0 - 0.1 K/uL  Comprehensive metabolic panel     Status: Abnormal   Collection Time: 12/26/14  8:37 PM  Result Value Ref Range   Sodium 139 135 - 145 mmol/L   Potassium 3.9 3.5 - 5.1 mmol/L   Chloride 105 101 - 111 mmol/L   CO2 22 22 - 32 mmol/L   Glucose, Bld 160 (H) 65 - 99 mg/dL   BUN 23 (H) 6 - 20 mg/dL   Creatinine, Ser 1.06 (H) 0.44 - 1.00 mg/dL   Calcium 9.7 8.9 - 10.3 mg/dL   Total Protein 7.2 6.5 - 8.1 g/dL   Albumin 3.9 3.5 - 5.0 g/dL   AST 124 (H) 15 - 41 U/L   ALT 49 14 - 54 U/L   Alkaline Phosphatase 84 38 - 126 U/L   Total Bilirubin 1.2 0.3 - 1.2 mg/dL   GFR calc non Af Amer 49 (L) >60 mL/min   GFR calc Af Amer 57 (L) >60 mL/min    Comment: (NOTE) The eGFR has been calculated using the CKD EPI equation. This calculation has not been validated in all clinical situations. eGFR's persistently <60 mL/min signify possible Chronic Kidney Disease.    Anion gap 12 5 - 15  CBG monitoring, ED     Status: Abnormal   Collection Time: 12/27/14 12:43 AM  Result Value Ref Range   Glucose-Capillary 147 (H) 65 - 99 mg/dL  CK     Status: Abnormal   Collection Time: 12/27/14 12:45 AM  Result Value Ref Range   Total CK 2540 (H) 38 - 234 U/L  Troponin I     Status: Abnormal   Collection Time: 12/27/14 12:45 AM  Result Value Ref Range   Troponin I 0.51 (HH) <0.031 ng/mL    Comment:        POSSIBLE MYOCARDIAL ISCHEMIA. SERIAL TESTING RECOMMENDED. CRITICAL RESULT CALLED TO, READ BACK BY AND VERIFIED WITH: B.SMITH,RN 0155 12/27/14 M.CAMPBELL   Glucose, capillary     Status: Abnormal   Collection Time: 12/27/14  6:38 AM  Result Value Ref Range   Glucose-Capillary 108 (H) 65 - 99 mg/dL  CBC with  Differential/Platelet     Status: Abnormal   Collection Time: 12/27/14  7:36 AM  Result Value Ref Range   WBC 9.8 4.0 - 10.5 K/uL   RBC 3.90 3.87 - 5.11 MIL/uL   Hemoglobin 11.7 (L) 12.0 - 15.0 g/dL   HCT 34.8 (L) 36.0 - 46.0 %   MCV 89.2 78.0 - 100.0 fL   MCH 30.0 26.0 - 34.0 pg   MCHC 33.6 30.0 - 36.0 g/dL   RDW 13.6 11.5 - 15.5 %   Platelets 198 150 - 400 K/uL   Neutrophils Relative % 66 43 - 77 %   Neutro Abs 6.5 1.7 - 7.7 K/uL   Lymphocytes Relative 21 12 - 46 %   Lymphs Abs 2.1 0.7 - 4.0 K/uL   Monocytes Relative 11 3 - 12 %   Monocytes Absolute 1.1 (H) 0.1 - 1.0 K/uL   Eosinophils Relative 1 0 - 5 %   Eosinophils Absolute 0.1 0.0 - 0.7 K/uL   Basophils Relative 1 0 - 1 %   Basophils Absolute 0.1 0.0 - 0.1 K/uL  Dg Chest 2 View  12/27/2014   CLINICAL DATA:  79 year old female status post fall.  EXAM: CHEST  2 VIEW  COMPARISON:  Radiograph dated 11/12/2013  FINDINGS: There are emphysematous changes of the lungs. No focal consolidation, pleural effusion, or pneumothorax. Stable cardiomegaly. Left pectoral pacemaker device. The osseous structures are grossly unremarkable.  IMPRESSION: No active cardiopulmonary disease.   Electronically Signed   By: Anner Crete M.D.   On: 12/27/2014 01:32   Ct Head Wo Contrast  12/26/2014   CLINICAL DATA:  79 year old female status post fall.  EXAM: CT HEAD WITHOUT CONTRAST  TECHNIQUE: Contiguous axial images were obtained from the base of the skull through the vertex without intravenous contrast.  COMPARISON:  CT dated 11/12/2013  FINDINGS: The ventricles are dilated and the sulci are prominent compatible with age-related atrophy. Periventricular and deep white matter hypodensities represent chronic microvascular ischemic changes. There is no intracranial hemorrhage. No mass effect or midline shift identified.  The visualized paranasal sinuses and mastoid air cells are well aerated. The calvarium is intact.  IMPRESSION: No acute intracranial  pathology.  Age-related atrophy and chronic microvascular ischemic disease.  If symptoms persist and there are no contraindications, MRI may provide better evaluation if clinically indicated.   Electronically Signed   By: Anner Crete M.D.   On: 12/26/2014 23:11    Review Of Systems Constitutional: no fever  Eyes: no drainage  ENT: no runny nose  Cardiovascular: no chest pain  Resp: Positive SOB  GI: no vomiting, did have 2 episodes of diarrhea earlier this week GU: no dysuria Integumentary: no rash. Positive wounds Allergy: no hives  Musculoskeletal: no leg swelling. Positive back pain Neurological: no slurred speech ROS otherwise negative  Blood pressure 108/51, pulse 74, temperature 98.4 F (36.9 C), temperature source Oral, resp. rate 16, height 5' 5"  (1.651 m), weight 62.551 kg (137 lb 14.4 oz), SpO2 94 %. CONSTITUTIONAL: Alert and oriented and responds appropriately to questions. Elderly and chronically ill appearing HEAD: Normocephalic; atraumatic, EYES: Conjunctivae clear, PERRL, EOMI ENT: normal nose; Moist mucous membranes; pharynx without lesions noted; no dental injury; no septal hematoma NECK: Supple, No JVD. CARD: RRR; S1 and S2 appreciated; II/VI systolic murmur. No clicks, no rubs, no gallops RESP: Normal chest excursion without splinting or tachypnea; breath sounds clear; Mild expiratory wheezes.  CHEST:Chest wall non-tender. ABD/GI: Normal bowel sounds; non-distended; soft, non-tender. PELVIS: stable, nontender to palpation BACK: The back appears normal and is non-tender to palpation, there is no CVA tenderness; no midline spinal tenderness. EXT: Normal ROM in all joints; non-tender to palpation; no edema; normal capillary refill; no cyanosis, no bony tenderness or bony deformity of patient's extremities, no joint effusion  SKIN: Normal color for age and race; warm; Multiple areas of ecchymosis and multiple stages of healing and bruising. NEURO:  Moves all extremities equally, sensation to light touch intact diffusely, cranial nerves II through XII intact PSYCH: The patient's mood and manner are appropriate. Grooming and personal hygiene are appropriate.  Assessment/Plan Recurrent fall Chronic Atrial fibrillation S/P Pacemaker placement COPD Hypertension  Agree with serial enzymes. Nuclear stress test in AM-patient wants it postponed for tomorrow. Echocardiogram  Birdie Riddle, MD  12/27/2014, 8:33 AM

## 2014-12-27 NOTE — H&P (Addendum)
[facility] History and Physical  Erin Vang WUJ:[id number] DOB: 10/10/1935 DOA: 12/26/2014  Referring physician: Dr. Leonides Schanz. PCP: Estill Dooms, MD  Specialists: Dr. Mare Ferrari. Cardiologist.  Chief Complaint: Fall.  HPI: Erin Vang is a 79 y.o. female with a history of chronic atrial fibrillation/tachybradycardia syndrome status post AV nodal ablation and pacemaker placement, hypertension, hyperlipidemia and diabetes mellitus was brought to the ER to patient had 2 falls within the last 24 hours. As per patient's daughter patient had a fall yesterday morning around 1 AM when patient was walking in the street. Patient's daughter does not know why exactly patient was walking on the street. Later in the evening patient again had a fall at her house and was brought to the ER. CT of the head did not show anything acute. Labs revealed rhabdomyolysis with mildly elevated troponin. On exam patient is not a good historian and has memory issues and does not provide good history. But denies any pain. As per the daughter patient has sustained elbow injury which is mildly bleeding initially on her first fall. Patient was given fluids and admitted for further management. Patient has history of gait instability and was recently started on medications for dizziness and urinary incontinence by patient's primary care physician as per patient's daughter.  Review of Systems: As presented in the history of presenting illness, rest negative.  Past Medical History  Diagnosis Date  . Hypertension   . Osteoarthritis   . Hypercholesterolemia   . Paroxysmal atrial fibrillation     CHADS2: 2; managed with rate control and anticoagulation; s/p AVN ablation 04/2013  . Syncope and collapse     When patient stands up real quickly  . Normal cardiac stress test     a.  lexiscan MV - EF 72%, No ischemia  . Anemia     ? of lab error vs GI bleed  . COPD (chronic obstructive pulmonary disease)   . Chronic  anticoagulation     On Xarelto  . Exertional dyspnea   . Type II diabetes mellitus   . History of blood transfusion     "wasn't needed; had some bad lab results" (03-23-12)  . Depression   . Prolonged grief reaction     "husband died 1 yr ago" (Mar 23, 2012)  . Urinary incontinence   . Benign paroxysmal positional vertigo 07/22/2012  . Tinnitus of left ear 07/22/2012  . Unspecified constipation   . Insomnia, unspecified   . Anxiety state, unspecified    Past Surgical History  Procedure Laterality Date  . Appendectomy  1952  . Cervical disc surgery  04/2009  . Hip arthroplasty  11/08/2011    Procedure: ARTHROPLASTY BIPOLAR HIP;  Surgeon: Mauri Pole, MD;  Location: [place];  Service: Orthopedics;  Laterality: Left;  . Insert / replace / remove pacemaker  23-Mar-2012  . Tubal ligation  1974  . Cholecystectomy  1984  . Polypectomy  11/21/1999    Endometrial polyps  . Eye surgery Right 03/2006    Cataract surgery  . Eye surgery  11/2004    Lid surgery  . Ablation  05/22/2013    AVN ablation by Dr Caryl Comes  . Permanent pacemaker insertion N/A 03-23-2012    Procedure: PERMANENT PACEMAKER INSERTION;  Surgeon: Deboraha Sprang, MD;  Location: [facility] CATH LAB;  Service: Cardiovascular;  Laterality: N/A;  . Av node ablation N/A 05/22/2013    Procedure: AV NODE ABLATION;  Surgeon: Deboraha Sprang, MD;  Location: [facility] CATH LAB;  Service:  Cardiovascular;  Laterality: N/A;   Social History:  reports that she quit smoking about 28 years ago. Her smoking use included Cigarettes. She has a 70 pack-year smoking history. She has never used smokeless tobacco. She reports that she does not drink alcohol or use illicit drugs. Where does patient live home. Can patient participate in ADLs? Not sure.  Allergies  Allergen Reactions  . Erythromycin Hives  . Penicillins Hives and Rash  . Sulfa Antibiotics Hives  . Sulfonamide Derivatives Hives and Rash  . Wool Alcohol [Lanolin Alcohol] Hives  . Neomycin-Bacitracin  Zn-Polymyx Rash  . Nitrofurantoin Other (See Comments)    Unknown   . Nitrofurantoin Monohyd Macro Other (See Comments)    Unknown     Family History:  Family History  Problem Relation Age of Onset  . Arthritis Mother   . Kidney nephrosis Mother   . Ulcers Mother     Peptic  . Heart failure Mother     CHF  . Cancer Father     Kidney  . Early death Son     Self-inflicted gunshot  . Heart attack Neg Hx       Prior to Admission medications   Medication Sig Start Date End Date Taking? Authorizing Provider  Blood Glucose Monitoring Suppl (FREESTYLE LITE) DEVI Check blood sugar two times daily E11.9 09/14/14  Yes Estill Dooms, MD  Cholecalciferol (VITAMIN D PO) Take 1,000 Units by mouth daily.    Yes Historical Provider, MD  Flaxseed, Linseed, (FLAX SEED OIL PO) Take 1,400 mg by mouth daily.    Yes Historical Provider, MD  Fluticasone-Salmeterol (ADVAIR DISKUS) 250-50 MCG/DOSE AEPB One inhalation twice daily for breathing 03/25/14  Yes Tiffany L Reed, DO  glucose blood test strip Freestyle Lite, check blood sugar twice daily E11.9 09/14/14  Yes Estill Dooms, MD  Lancets (FREESTYLE) lancets Freestyle Lite, Check blood sugar two times daily E11.9 09/14/14  Yes Estill Dooms, MD  Linagliptin-Metformin HCl 2.08-998 MG TABS One each morning to control diabetes 06/10/14  Yes Estill Dooms, MD  meclizine (ANTIVERT) 25 MG tablet Take one tablet by mouth every 6 hours as needed for dizziness 06/29/14  Yes Estill Dooms, MD  mirabegron ER [facility]) 25 MG TB24 tablet One daily to help bladder control 11/23/14  Yes Estill Dooms, MD  Multiple Vitamins-Minerals (CENTRUM SILVER PO) Take 1 tablet by mouth daily.    Yes Historical Provider, MD  rivaroxaban (XARELTO) 20 MG TABS tablet Take 20 mg by mouth daily with supper.   Yes Historical Provider, MD    Physical Exam: Filed Vitals:   12/27/14 0200 12/27/14 0239 12/27/14 0300 12/27/14 0330  BP: 114/43 115/56 123/57 123/54  Pulse: 68 71 70 73   Temp:    97.5 F (36.4 C)  TempSrc:    Oral  Resp: 19 18 17 17   Height:    5\' 5"  (1.651 m)  Weight:    63.458 kg (139 lb 14.4 oz)  SpO2: 98% 99% 99% 98%     General:  Moderately built and nourished.  Eyes: Anicteric no pallor.  ENT: No discharge from the ears eyes nose and mouth.  Neck: No mass felt.  Cardiovascular: S1 and S2 heard.  Respiratory: No rhonchi or crepitations.  Abdomen: Soft nontender bowel sounds present.  Skin: Skin bruise on the joints.  Musculoskeletal: No edema.  Psychiatric: Patient has memory issues.  Neurologic: Patient is following commands and knows where she is. But has some memory issues.  Moves all extremities.  Labs on Admission:  Basic Metabolic Panel:  Recent Labs Lab 12/26/14 2037  NA 139  K 3.9  CL 105  CO2 22  GLUCOSE 160*  BUN 23*  CREATININE 1.06*  CALCIUM 9.7   Liver Function Tests:  Recent Labs Lab 12/26/14 2037  AST 124*  ALT 49  ALKPHOS 84  BILITOT 1.2  PROT 7.2  ALBUMIN 3.9   No results for input(s): LIPASE, AMYLASE in the last 168 hours. No results for input(s): AMMONIA in the last 168 hours. CBC:  Recent Labs Lab 12/26/14 2037  WBC 15.2*  NEUTROABS 12.6*  HGB 13.7  HCT 40.4  MCV 87.8  PLT 267   Cardiac Enzymes:  Recent Labs Lab 12/27/14 0045  CKTOTAL 2540*  TROPONINI 0.51*    BNP (last 3 results) No results for input(s): BNP in the last 8760 hours.  ProBNP (last 3 results) No results for input(s): PROBNP in the last 8760 hours.  CBG:  Recent Labs Lab 12/27/14 0043  GLUCAP 147*    Radiological Exams on Admission: Dg Chest 2 View  12/27/2014   CLINICAL DATA:  79 year old female status post fall.  EXAM: CHEST  2 VIEW  COMPARISON:  Radiograph dated 11/12/2013  FINDINGS: There are emphysematous changes of the lungs. No focal consolidation, pleural effusion, or pneumothorax. Stable cardiomegaly. Left pectoral pacemaker device. The osseous structures are grossly unremarkable.   IMPRESSION: No active cardiopulmonary disease.   Electronically Signed   By: Anner Crete M.D.   On: 12/27/2014 01:32   Ct Head Wo Contrast  12/26/2014   CLINICAL DATA:  79 year old female status post fall.  EXAM: CT HEAD WITHOUT CONTRAST  TECHNIQUE: Contiguous axial images were obtained from the base of the skull through the vertex without intravenous contrast.  COMPARISON:  CT dated 11/12/2013  FINDINGS: The ventricles are dilated and the sulci are prominent compatible with age-related atrophy. Periventricular and deep white matter hypodensities represent chronic microvascular ischemic changes. There is no intracranial hemorrhage. No mass effect or midline shift identified.  The visualized paranasal sinuses and mastoid air cells are well aerated. The calvarium is intact.  IMPRESSION: No acute intracranial pathology.  Age-related atrophy and chronic microvascular ischemic disease.  If symptoms persist and there are no contraindications, MRI may provide better evaluation if clinically indicated.   Electronically Signed   By: Anner Crete M.D.   On: 12/26/2014 23:11    EKG: Independently reviewed. Paced rhythm.  Assessment/Plan Principal Problem:   Rhabdomyolysis Active Problems:   Diabetes type 2, controlled   Essential hypertension   COPD (chronic obstructive pulmonary disease)   Chronic anticoagulation with Xarelto   Chronic atrial fibrillation   Fall   Non-traumatic rhabdomyolysis   1. Fall with rhabdomyolysis - continue gentle hydration. Closely follow CK levels intake output. 2. Acute renal failure - probably from dehydration. Gently hydrated and closely follow metabolic panel intake output. 3. Diabetes mellitus type 2 controlled - I'm holding her metformin but continuing Linagliptin. 4. Elevated troponin - patient is a poor historian but denies any chest pain. Cycle cardiac markers. I'll consult cardiology. 5. Hypertension - continue present medication. 6. COPD Presley not  wheezing. 7. Chronic atrial fibrillation status post pacemaker placement - patient's chads 2 vasc score is more than 2. Patient is on Xarelto which will be continued. Patient's rate is controlled at this time. 8. Patient has bilateral elbow and knee skin tear from fall - will order tetanus booster and x-ray pelvis is pending.  I  have reviewed patient's old charts and labs. I have consulted cardiology.   DVT Prophylaxis xarelto.  Code Status: DO NOT RESUSCITATE.  Family Communication: Discussed with patient's daughter.  Disposition Plan: Admit to inpatient.    Dariann Huckaba N. [facility] Pager [phone].  If 7PM-7AM, please contact night-coverage [email or web address] Password [facility] 12/27/2014, 6:19 AM

## 2014-12-27 NOTE — Progress Notes (Signed)
Erin Vang [id number] Admission Data: 12/27/2014 4:04 AM Attending Provider: Geradine Girt, DO CVE:LFYBO, Viviann Spare, MD Code Status: Full  Erin Vang is a 79 y.o. female patient admitted from ED:  -No acute distress noted.  -No complaints of shortness of breath.  -No complaints of chest pain.   Cardiac Monitoring: Box # 23 in place. Cardiac monitor yields:V paced.  Blood pressure 123/54, pulse 73, temperature 97.5 F (36.4 C), temperature source Oral, resp. rate 17, height 5\' 5"  (1.651 m), weight 63.458 kg (139 lb 14.4 oz), SpO2 98 %.   IV Fluids:  IV in place, occlusive dsg intact without redness, IV cath forearm right, condition patent and no redness normal saline.   Allergies:  Erythromycin; Penicillins; Sulfa antibiotics; Sulfonamide derivatives; Wool alcohol; Neomycin-bacitracin zn-polymyx; Nitrofurantoin; and Nitrofurantoin monohyd macro  Past Medical History:   has a past medical history of Hypertension; Osteoarthritis; Hypercholesterolemia; Paroxysmal atrial fibrillation; Syncope and collapse; Normal cardiac stress test; Anemia; COPD (chronic obstructive pulmonary disease); Chronic anticoagulation; Exertional dyspnea; Type II diabetes mellitus; History of blood transfusion; Depression; Prolonged grief reaction; Urinary incontinence; Benign paroxysmal positional vertigo (07/22/2012); Tinnitus of left ear (07/22/2012); Unspecified constipation; Insomnia, unspecified; and Anxiety state, unspecified.  Past Surgical History:   has past surgical history that includes Appendectomy (1952); Cervical disc surgery (04/2009); Hip Arthroplasty (11/08/2011); Insert / replace / remove pacemaker (02/29/2012); Tubal ligation (1974); Cholecystectomy (1984); Polypectomy (11/21/1999); Eye surgery (Right, 03/2006); Eye surgery (11/2004); Ablation (05/22/2013); permanent pacemaker insertion (N/A, 02/29/2012); and av node ablation (N/A, 05/22/2013).  Social History:   reports that she quit smoking about 28 years  ago. Her smoking use included Cigarettes. She has a 70 pack-year smoking history. She has never used smokeless tobacco. She reports that she does not drink alcohol or use illicit drugs.  Skin: charted on [facility]  Patient/Family orientated to room. Information packet given to patient/family. Admission inpatient armband information verified with patient/family to include name and date of birth and placed on patient arm. Side rails up x 2, fall assessment and education completed with patient/family. Patient/family able to verbalize understanding of risk associated with falls and verbalized understanding to call for assistance before getting out of bed. Call light within reach. Patient/family able to voice and demonstrate understanding of unit orientation instructions.

## 2014-12-27 NOTE — Evaluation (Signed)
Physical Therapy Evaluation Patient Details Name: Erin Vang MRN: [id number] DOB: Dec 03, 1935 Today's Date: 12/27/2014   History of Present Illness  79 y.o. female with a history of chronic atrial fibrillation/tachybradycardia syndrome status post AV nodal ablation and pacemaker placement, hypertension, hyperlipidemia and diabetes mellitus was brought to the ER to patient had 2 falls within the last 24 hours  Clinical Impression  Pt pleasant with flat affect and decreased safety awareness, balance and problem solving. Pt lives alone and tends to stay up late and sleep late per family with increased cognitive and balance decline over the last 3 months per family. Pt with difficulty with all gait and mobility and will benefit from SNF for functional improvement with potential need for long term ALF. Will continue to follow.     Follow Up Recommendations SNF;Supervision/Assistance - 24 hour    Equipment Recommendations  None recommended by PT    Recommendations for Other Services       Precautions / Restrictions Precautions Precautions: Fall      Mobility  Bed Mobility Overal bed mobility: Needs Assistance Bed Mobility: Supine to Sit     Supine to sit: Supervision     General bed mobility comments: cues for sequence with reliance on rail and increased time  Transfers Overall transfer level: Needs assistance   Transfers: Sit to/from Stand Sit to Stand: Min assist         General transfer comment: assist for anterior translation and rise from surface with repeated attempts to stand before able to complete transfer into standing  Ambulation/Gait Ambulation/Gait assistance: Min assist Ambulation Distance (Feet): 175 Feet Assistive device: Rolling walker (2 wheeled) Gait Pattern/deviations: Step-through pattern;Decreased stride length;Drifts right/left   Gait velocity interpretation: Below normal speed for age/gender General Gait Details: cues for safety, position in RW  and obstacles pt has a tendency to hit obstacles and try to run through them  Stairs            Wheelchair Mobility    Modified Rankin (Stroke Patients Only)       Balance Overall balance assessment: History of Falls;Needs assistance   Sitting balance-Leahy Scale: Good       Standing balance-Leahy Scale: Poor                               Pertinent Vitals/Pain Pain Assessment: No/denies pain  98% on RA HR 83    Home Living Family/patient expects to be discharged to:: Private residence Living Arrangements: Alone Available Help at Discharge: Family;Available PRN/intermittently Type of Home: House Home Access: Stairs to enter   Entrance Stairs-Number of Steps: 2 Home Layout: One level Home Equipment: Walker - 2 wheels;Cane - single point;Grab bars - toilet;Grab bars - tub/shower;Shower seat      Prior Function Level of Independence: Independent         Comments: pt has assist for some housework, shopping and driving. Is supposed to use RW per son but tends to carry it     Hand Dominance        Extremity/Trunk Assessment   Upper Extremity Assessment: Generalized weakness           Lower Extremity Assessment: Generalized weakness;RLE deficits/detail;LLE deficits/detail RLE Deficits / Details: hip flexion 3+/5, knee extension 5/5, knee flexion 3+/5 LLE Deficits / Details: hip flexion 3+/5, knee extension 5/5, knee flexion 3+/5  Cervical / Trunk Assessment: Kyphotic  Communication   Communication: No difficulties  Cognition  Arousal/Alertness: Awake/alert Behavior During Therapy: Flat affect Overall Cognitive Status: Impaired/Different from baseline Area of Impairment: Orientation;Safety/judgement;Awareness;Problem solving Orientation Level: Disoriented to;Time       Safety/Judgement: Decreased awareness of safety;Decreased awareness of deficits   Problem Solving: Difficulty sequencing;Slow processing General Comments: pt  repeatedly running into obstacles with RW despite cueing, unable to find room number even though able to recall the number    General Comments      Exercises General Exercises - Lower Extremity Long Arc Quad: AROM;Seated;Both;15 reps Heel Slides: AROM;Seated;Both;15 reps Toe Raises: AROM;Seated;Both;15 reps      Assessment/Plan    PT Assessment Patient needs continued PT services  PT Diagnosis Difficulty walking;Generalized weakness;Altered mental status   PT Problem List Decreased strength;Decreased cognition;Decreased activity tolerance;Decreased balance;Decreased safety awareness;Decreased knowledge of use of DME;Decreased mobility  PT Treatment Interventions DME instruction;Gait training;Stair training;Functional mobility training;Therapeutic activities;Therapeutic exercise;Balance training;Cognitive remediation;Patient/family education   PT Goals (Current goals can be found in the Care Plan section) Acute Rehab PT Goals Patient Stated Goal: return home after rehab PT Goal Formulation: With patient Time For Goal Achievement: 01/10/15 Potential to Achieve Goals: Fair    Frequency Min 3X/week   Barriers to discharge Decreased caregiver support      Co-evaluation               End of Session Equipment Utilized During Treatment: Gait belt Activity Tolerance: Patient tolerated treatment well Patient left: in chair;with call bell/phone within reach;with chair alarm set Nurse Communication: Mobility status         Time: 3888-2800 PT Time Calculation (min) (ACUTE ONLY): 21 min   Charges:   PT Evaluation $Initial PT Evaluation Tier I: 1 Procedure     PT G CodesMelford Aase 12/27/2014, 12:54 PM [name], [place]

## 2014-12-27 NOTE — ED Provider Notes (Signed)
This chart was scribed for Maxwell, DO by Forrestine Him, ED Scribe. This patient was seen in room D32C/D32C and the patient's care was started 1:14 AM.   TIME SEEN: 1:14 AM   CHIEF COMPLAINT:  Chief Complaint  Patient presents with  . Fall     HPI:  HPI Comments: Erin Vang is a 79 y.o. female with a PMHx of HTN, osteoarthritis, COPD, and DM who presents to the Emergency Department her after several falls in last several days. Daughter states she lives at home alone. Yesterday morning pt sustained her most recent fall  at 1:00 AM after attempting to take the trash out without any shoes. Mother states pt remained on the ground for 5 hours before she called for help. Daughter called to check on her today but did not get an answer. When neighbor came to check on her, pt was found on the ground in the hallway. She been laying on the floor for several hours again. Erin Vang denies any pain. Has skin tears to bilateral elbows and multiple areas of bruising from recent frequent falls. Daughter reports that she's been getting weaker progressively over the past year. Ongoing shortness of breath also reports, however, this is baseline for pt. states it is worse with exertion. Is seeing a pulmonologist. Does not wear oxygen at home. No recent fever, chills, nausea, vomiting, abdominal pain, or chest pain. No numbness  or loss of sensation. [place] focal weakness. Pt recently completed OT but still has PT weekly visits for generalized weakness. However, she still reports worsening weakness at baseline preventing her from walking long distances.  PCP: GREEN, Viviann Spare, MD   ROS: See HPI Constitutional: no fever  Eyes: no drainage  ENT: no runny nose   Cardiovascular:  no chest pain  Resp: Positive SOB  GI: no vomiting, did have 2 episodes of diarrhea earlier this week GU: no dysuria Integumentary: no rash. Positive wounds Allergy: no hives  Musculoskeletal: no leg swelling. Positive back  pain Neurological: no slurred speech ROS otherwise negative  PAST MEDICAL HISTORY/PAST SURGICAL HISTORY:  Past Medical History  Diagnosis Date  . Hypertension   . Osteoarthritis   . Hypercholesterolemia   . Paroxysmal atrial fibrillation     CHADS2: 2; managed with rate control and anticoagulation; s/p AVN ablation 04/2013  . Syncope and collapse     When patient stands up real quickly  . Normal cardiac stress test     a.  lexiscan MV - EF 72%, No ischemia  . Anemia     ? of lab error vs GI bleed  . COPD (chronic obstructive pulmonary disease)   . Chronic anticoagulation     On Xarelto  . Exertional dyspnea   . Type II diabetes mellitus   . History of blood transfusion     "wasn't needed; had some bad lab results" (March 20, 2012)  . Depression   . Prolonged grief reaction     "husband died 1 yr ago" (03-20-2012)  . Urinary incontinence   . Benign paroxysmal positional vertigo 07/22/2012  . Tinnitus of left ear 07/22/2012  . Unspecified constipation   . Insomnia, unspecified   . Anxiety state, unspecified     MEDICATIONS:  Prior to Admission medications   Medication Sig Start Date End Date Taking? Authorizing Provider  Blood Glucose Monitoring Suppl (FREESTYLE LITE) DEVI Check blood sugar two times daily E11.9 09/14/14   Estill Dooms, MD  Cholecalciferol (VITAMIN D PO) Take 1,000 Units by  mouth daily.     Historical Provider, MD  Flaxseed, Linseed, (FLAX SEED OIL PO) Take 1,400 mg by mouth daily.     Historical Provider, MD  Fluticasone-Salmeterol (ADVAIR DISKUS) 250-50 MCG/DOSE AEPB One inhalation twice daily for breathing 03/25/14   Tiffany L Reed, DO  glucose blood test strip Freestyle Lite, check blood sugar twice daily E11.9 09/14/14   Estill Dooms, MD  Lancets (FREESTYLE) lancets Freestyle Lite, Check blood sugar two times daily E11.9 09/14/14   Estill Dooms, MD  Linagliptin-Metformin HCl 2.08-998 MG TABS One each morning to control diabetes 06/10/14   Estill Dooms, MD   meclizine (ANTIVERT) 25 MG tablet Take one tablet by mouth every 6 hours as needed for dizziness 06/29/14   Estill Dooms, MD  mirabegron ER [facility]) 25 MG TB24 tablet One daily to help bladder control 11/23/14   Estill Dooms, MD  Multiple Vitamins-Minerals (CENTRUM SILVER PO) Take 1 tablet by mouth daily.     Historical Provider, MD  rivaroxaban (XARELTO) 20 MG TABS tablet Take 20 mg by mouth daily with supper.    Historical Provider, MD    ALLERGIES:  Allergies  Allergen Reactions  . Erythromycin Hives  . Penicillins Hives and Rash  . Sulfa Antibiotics Hives  . Sulfonamide Derivatives Hives and Rash  . Wool Alcohol [Lanolin Alcohol] Hives  . Neomycin-Bacitracin Zn-Polymyx Rash  . Nitrofurantoin Other (See Comments)    Unknown   . Nitrofurantoin Monohyd Macro Other (See Comments)    Unknown     SOCIAL HISTORY:  Social History  Substance Use Topics  . Smoking status: Former Smoker -- 2.00 packs/day for 35 years    Types: Cigarettes    Quit date: 04/23/1986  . Smokeless tobacco: Never Used  . Alcohol Use: No    FAMILY HISTORY: Family History  Problem Relation Age of Onset  . Arthritis Mother   . Kidney nephrosis Mother   . Ulcers Mother     Peptic  . Heart failure Mother     CHF  . Cancer Father     Kidney  . Early death Son     Self-inflicted gunshot  . Heart attack Neg Hx     EXAM: BP 122/54 mmHg  Pulse 70  Temp(Src) 97.7 F (36.5 C) (Oral)  Resp 17  SpO2 98% CONSTITUTIONAL: Alert and oriented and responds appropriately to questions. Elderly and chronically ill appearing; GCS 15 HEAD: Normocephalic; atraumatic EYES: Conjunctivae clear, PERRL, EOMI ENT: normal nose; no rhinorrhea; moist mucous membranes; pharynx without lesions noted; no dental injury; no septal hematoma NECK: Supple, no meningismus, no LAD; no midline spinal tenderness, step-off or deformity CARD: RRR; S1 and S2 appreciated; no murmurs, no clicks, no rubs, no gallops RESP: Normal chest  excursion without splinting or tachypnea; breath sounds clear;  Mild expiratory wheezes, no rhonchi, no rales; no hypoxia or respiratory distress CHEST:  chest wall stable, no crepitus or ecchymosis or deformity, nontender to palpation ABD/GI: Normal bowel sounds; non-distended; soft, non-tender, no rebound, no guarding PELVIS:  stable, nontender to palpation BACK:  The back appears normal and is non-tender to palpation, there is no CVA tenderness; no midline spinal tenderness, step-off or deformity EXT: Normal ROM in all joints; non-tender to palpation; no edema; normal capillary refill; no cyanosis, no bony tenderness or bony deformity of patient's extremities, no joint effusion, no ecchymosis or lacerations    SKIN: Normal color for age and race; warm; Multiple areas of ecchymosis and multiple stages of  healing and bruissng NEURO: Moves all extremities equally, sensation to light touch intact diffusely, cranial nerves II through XII intact PSYCH: The patient's mood and manner are appropriate. Grooming and personal hygiene are appropriate.  MEDICAL DECISION MAKING: Patient here with frequent falls. Labs show leukocytosis with left shift. Urine shows no sign of infection does show large hemoglobin with only 0-2 RBCs. Concerning for myoglobinuria, elevated CK. Chest x-ray is clear. She does have some very mild wheezing. Given DuoNeb treatment. CT head shows no acute abnormality. We'll check patient's CK, EKG and troponin. We'll give IV fluids. Weigh bili patient in the emergency department.  ED PROGRESS: CK is elevated to 2500. Creatinine is very mildly elevated at 1.06. Will continue IV hydration. Her troponin is also positive which is likely secondary to an elevated CK level. Discussed with Dr. Doylene Canard with cardiology who agrees. He feels patient can be admitted to medicine service and they will consult in the morning. Will give patient aspirin. Discussed with hospitalist who agrees to admit patient to  telemetry.     EKG Interpretation  Date/Time:  Sunday December 26 2014 23:55:34 EDT Ventricular Rate:  74 PR Interval:    QRS Duration: 94 QT Interval:  549 QTC Calculation: 609 R Axis:   81 Text Interpretation:  Afib/flut and V-paced complexes No further analysis attempted due to paced rhythm Confirmed by POLLINA  MD, Sherman [phone]) on 12/27/2014 12:23:29 AM       I personally performed the services described in this documentation, which was scribed in my presence. The recorded information has been reviewed and is accurate.   [place], DO 12/27/14 [phone]

## 2014-12-28 ENCOUNTER — Inpatient Hospital Stay (HOSPITAL_COMMUNITY): Payer: Medicare Other

## 2014-12-28 DIAGNOSIS — T796XXD Traumatic ischemia of muscle, subsequent encounter: Secondary | ICD-10-CM

## 2014-12-28 LAB — GLUCOSE, CAPILLARY
GLUCOSE-CAPILLARY: 131 mg/dL — AB (ref 65–99)
GLUCOSE-CAPILLARY: 170 mg/dL — AB (ref 65–99)
Glucose-Capillary: 94 mg/dL (ref 65–99)

## 2014-12-28 LAB — CK: Total CK: 1151 U/L — ABNORMAL HIGH (ref 38–234)

## 2014-12-28 LAB — CBC
HCT: 33.6 % — ABNORMAL LOW (ref 36.0–46.0)
Hemoglobin: 10.9 g/dL — ABNORMAL LOW (ref 12.0–15.0)
MCH: 29.1 pg (ref 26.0–34.0)
MCHC: 32.4 g/dL (ref 30.0–36.0)
MCV: 89.8 fL (ref 78.0–100.0)
PLATELETS: 185 10*3/uL (ref 150–400)
RBC: 3.74 MIL/uL — AB (ref 3.87–5.11)
RDW: 13.6 % (ref 11.5–15.5)
WBC: 8.6 10*3/uL (ref 4.0–10.5)

## 2014-12-28 LAB — COMPREHENSIVE METABOLIC PANEL
ALT: 44 U/L (ref 14–54)
AST: 85 U/L — ABNORMAL HIGH (ref 15–41)
Albumin: 2.8 g/dL — ABNORMAL LOW (ref 3.5–5.0)
Alkaline Phosphatase: 65 U/L (ref 38–126)
Anion gap: 8 (ref 5–15)
BUN: 28 mg/dL — ABNORMAL HIGH (ref 6–20)
CHLORIDE: 110 mmol/L (ref 101–111)
CO2: 22 mmol/L (ref 22–32)
CREATININE: 1.02 mg/dL — AB (ref 0.44–1.00)
Calcium: 8.1 mg/dL — ABNORMAL LOW (ref 8.9–10.3)
GFR, EST AFRICAN AMERICAN: 60 mL/min — AB (ref >60)
GFR, EST NON AFRICAN AMERICAN: 52 mL/min — AB (ref >60)
Glucose, Bld: 142 mg/dL — ABNORMAL HIGH (ref 65–99)
POTASSIUM: 3.8 mmol/L (ref 3.5–5.1)
Sodium: 140 mmol/L (ref 135–145)
Total Bilirubin: 1 mg/dL (ref 0.3–1.2)
Total Protein: 5.1 g/dL — ABNORMAL LOW (ref 6.5–8.1)

## 2014-12-28 MED ORDER — MECLIZINE HCL 25 MG PO TABS: 12.5000 mg | ORAL_TABLET | Freq: Three times a day (TID) | ORAL | Status: DC | PRN

## 2014-12-28 MED ORDER — SODIUM CHLORIDE 0.9 % IV SOLN
INTRAVENOUS | Status: DC
Start: 2014-12-28 — End: 2014-12-29
  Administered 2014-12-28: 1000 mL via INTRAVENOUS

## 2014-12-28 MED ORDER — REGADENOSON 0.4 MG/5ML IV SOLN
INTRAVENOUS | Status: AC
  Administered 2014-12-28: 0.4 mg
  Filled 2014-12-28: qty 5

## 2014-12-28 MED ORDER — TECHNETIUM TC 99M SESTAMIBI GENERIC - CARDIOLITE
10.0000 | Freq: Once | INTRAVENOUS | Status: AC | PRN
Start: 2014-12-28 — End: 2014-12-28
  Administered 2014-12-28: 10 via INTRAVENOUS

## 2014-12-28 MED ORDER — TECHNETIUM TC 99M SESTAMIBI GENERIC - CARDIOLITE
10.0000 | Freq: Once | INTRAVENOUS | Status: AC | PRN
  Administered 2014-12-28: 10 via INTRAVENOUS

## 2014-12-28 MED ORDER — GLUCERNA SHAKE PO LIQD
237.0000 mL | Freq: Every day | ORAL | Status: DC
  Administered 2014-12-30: 237 mL via ORAL

## 2014-12-28 MED ORDER — FUROSEMIDE 10 MG/ML IJ SOLN
20.0000 mg | Freq: Once | INTRAMUSCULAR | Status: AC
  Administered 2014-12-28: 20 mg via INTRAVENOUS
  Filled 2014-12-28: qty 2

## 2014-12-28 NOTE — Telephone Encounter (Signed)
Received letter from [facility] [phone] and it states that they require a step therapy before they will cover Myrbetriq. She has to try Oxybutynin IR, Oxybutynin ER, Trospium Chloride 20mg , Detrol LA or Oxybutynin IR syrup.  Given to Dr. Nyoka Cowden to review.

## 2014-12-28 NOTE — Progress Notes (Signed)
Initial Nutrition Assessment  DOCUMENTATION CODES:   Not applicable  INTERVENTION:   Glucerna Shake po daily, each supplement provides 220 kcal and 10 grams of protein  NUTRITION DIAGNOSIS:   Increased nutrient needs related to chronic illness as evidenced by estimated needs  GOAL:   Patient will meet greater than or equal to 90% of their needs  MONITOR:   PO intake, Labs, I & O's, Supplement acceptance  REASON FOR ASSESSMENT:   Malnutrition Screening Tool  ASSESSMENT:   79 y.o. Female with PMH of chronic atrial fibrillation, HTN, DM; brought to ER after she had 2 falls within 24 hours. Per patient's daughter patient had a fall while walking in the street. Later in the evening patient again had a fall and was brought to the ER. CT of the head did not show anything acute. Labs revealed rhabdomyolysis with mildly elevated troponin.   Pt upset upon RD visit.  She yelled "they won't give me anything to eat because they won't take the sign (NPO) off the door!"  Reports she's been eating well at home.  Also reveals she's lost 10 lbs since May 2016, however, weight stable per readings below.  Would benefit from addition of oral nutrition supplements.  RD to order.  RD unable to complete Nutrition Focused Physical Exam at this time.  Diet Order:  Diet Carb Modified Fluid consistency:: Thin; Room service appropriate?: Yes  Skin:  Reviewed, no issues  CBG (last 3)   Recent Labs  12/27/14 2136 12/28/14 0535 12/28/14 1135  GLUCAP 137* 131* 170*    Last BM:  9/4  Height:   Ht Readings from Last 1 Encounters:  12/27/14 5\' 5"  (1.651 m)    Weight:   Wt Readings from Last 10 Encounters:  12/28/14 135 lb 12.9 oz (61.6 kg)  12/15/14 140 lb 3.2 oz (63.594 kg)  11/23/14 134 lb 6.4 oz (60.963 kg)  10/05/14 141 lb 12.8 oz (64.32 kg)  09/23/14 138 lb 12.8 oz (62.959 kg)  09/01/14 138 lb 9.6 oz (62.869 kg)  08/10/14 137 lb 6.4 oz (62.324 kg)  06/02/14 136 lb (61.689 kg)   03/02/14 138 lb (62.596 kg)  12/18/13 136 lb (61.689 kg)    Ideal Body Weight:  56.8 kg  BMI:  Body mass index is 22.6 kg/(m^2).  Estimated Nutritional Needs:   Kcal:  1400-1600  Protein:  70-80 gm  Fluid:  >/= 1.5 L  EDUCATION NEEDS:   No education needs identified at this time  Arthur Holms, RD, LDN Pager #: [phone] After-Hours Pager #: [phone]

## 2014-12-28 NOTE — Progress Notes (Signed)
Utilization review completed. Rozanna Boer, RN, BSN..cm

## 2014-12-28 NOTE — Progress Notes (Addendum)
[facility] TEAM 12 . PROGRESS NOTE  Erin Vang UVO:[id number] DOB: [birth year] DOA: 12/26/2014 PCP: Estill Dooms, MD  Admit HPI / Brief Narrative:  79 y.o. female with a history of chronic atrial fibrillation/tachybradycardia syndrome status post AV nodal ablation and pacemaker placement, hypertension, hyperlipidemia and diabetes mellitus was brought to the ER after she had 2 falls within 24 hours. As per patient's daughter patient had a fall while walking in the street. Later in the evening patient again had a fall and was brought to the ER. CT of the head did not show anything acute. Labs revealed rhabdomyolysis with mildly elevated troponin. Patient has history of gait instability and had recently been started on medications for dizziness and urinary incontinence.   HPI/Subjective: The pt is complaining about her tele box.  Otherwise she denies any complaints whatsoever.  She denies cp, sob, f/c, n/v, or abdom pain.    Assessment/Plan:  Fall with mild rhabdomyolysis CK slowly improving with gently hydration - cont to follow   Acute renal failure Due to dehydration + rhabdo - crt slow to improve - cont to hydrate and follow trend   Diabetes mellitus type 2 controlled CBG currently reasonably controlled - follow trend   Elevated troponin patient denies chest pain - likely due to rhabdo - nuc med stress test per Cardiology today   Hypertension BP currently reasonably controlled - follow trend   COPD  Well compensated at this time - follow clinically   Memory deficit - gait instability  Pt tells me she lives alone - I suspect it is no longer safe for her to do so due to what appears to be progressive dementia - check B12 and folate - check RPR - PT/OT evals   Chronic atrial fibrillation status post ablation/pacemaker chads 2 vasc score more than 2 - was on Xarelto but with multiple recent falls may not be appropriate for ongoing use as outpt - cont while in hospital, but await  PT/OT evals to determine safety of continuing after d/c   Bilateral elbow and knee skin tears from fall tetanus booster - x-ray pelvis is negative for fracture - pt denies focal pain   Isolated AST elevation ?EtOH abuse - pt denies - follow clinically for evidence of withdrawal  Code Status: DNR Family Communication: no family present at time of exam Disposition Plan: tele - PT/OT evals - ?SNF   Consultants: Cardiology   Procedures: Nuc Med stress test - pending  TTE 9/5 -   Antibiotics: none  DVT prophylaxis: Xarelto   Objective: Blood pressure 139/69, pulse 76, temperature 98.3 F (36.8 C), temperature source Axillary, resp. rate 18, height 5\' 5"  (1.651 m), weight 61.6 kg (135 lb 12.9 oz), SpO2 98 %.  Intake/Output Summary (Last 24 hours) at 12/28/14 1023 Last data filed at 12/28/14 0758  Gross per 24 hour  Intake    645 ml  Output   1530 ml  Net   -885 ml   Exam: General: No acute respiratory distress - alert but not fully oriented  Lungs: Clear to auscultation bilaterally without wheezes or crackles Cardiovascular: Regular rate without murmur gallop or rub normal S1 and S2 Abdomen: Nontender, nondistended, soft, bowel sounds positive, no rebound, no ascites, no appreciable mass Extremities: No significant cyanosis, clubbing, or edema bilateral lower extremities  Data Reviewed: Basic Metabolic Panel:  Recent Labs Lab 12/26/14 2037 12/27/14 0736 12/28/14 0346  NA 139 139 140  K 3.9 3.6 3.8  CL 105 108 110  CO2 22 22 22   GLUCOSE 160* 124* 142*  BUN 23* 24* 28*  CREATININE 1.06* 0.95 1.02*  CALCIUM 9.7 8.3* 8.1*    CBC:  Recent Labs Lab 12/26/14 2037 12/27/14 0736 12/28/14 0346  WBC 15.2* 9.8 8.6  NEUTROABS 12.6* 6.5  --   HGB 13.7 11.7* 10.9*  HCT 40.4 34.8* 33.6*  MCV 87.8 89.2 89.8  PLT 267 198 185    Liver Function Tests:  Recent Labs Lab 12/26/14 2037 12/27/14 0736 12/28/14 0346  AST 124* 93* 85*  ALT 49 41 44  ALKPHOS 84 63 65   BILITOT 1.2 0.9 1.0  PROT 7.2 5.4* 5.1*  ALBUMIN 3.9 2.9* 2.8*    Cardiac Enzymes:  Recent Labs Lab 12/27/14 0045 12/27/14 0736 12/27/14 1310 12/27/14 1814 12/28/14 0346  CKTOTAL 2540* 1860*  --   --  1151*  TROPONINI 0.51* 0.49* 0.32* 0.28*  --     CBG:  Recent Labs Lab 12/27/14 5597 12/27/14 1107 12/27/14 1635 12/27/14 2136 12/28/14 0535  GLUCAP 108* 244* 119* 137* 131*    Studies:   Recent x-ray studies have been reviewed in detail by the Attending Physician  Scheduled Meds:  Scheduled Meds: . feeding supplement (ENSURE ENLIVE)  237 mL Oral BID BM  . insulin aspart  0-9 Units Subcutaneous TID WC  . linagliptin  5 mg Oral Daily  . mirabegron ER  25 mg Oral Daily  . mometasone-formoterol  2 puff Inhalation BID  . rivaroxaban  15 mg Oral Q supper    Time spent on care of this patient: 35 mins   Kalel Harty T , MD   [facility] Office  [phone] Pager - Text Page per Shea Evans as per below:  On-Call/Text Page:      [email or web address]      password [email or web address]  If 7PM-7AM, please contact night-coverage [email or web address] Password [email or web address] 12/28/2014, 10:23 AM   LOS: 1 day

## 2014-12-28 NOTE — Consult Note (Signed)
Ref: GREEN, Viviann Spare, MD   Subjective:  No significant shortness of breath at rest. Lower hemoglobin from hydration. Echocardiogram with moderate to severe MR and TR with mild LVH and good LV systolic function and dilated IVC with mild to moderate pulmonary . Nuclear stress test result is pending but appears to mildly positive for reversible ischemia in antero-lateral wall.  Objective:  Vital Signs in the last 24 hours: Temp:  [97.8 F (36.6 C)-98.8 F (37.1 C)] 97.8 F (36.6 C) (09/06 1420) Pulse Rate:  [70-76] 70 (09/06 1420) Cardiac Rhythm:  [-] Ventricular paced (09/06 1235) Resp:  [16-18] 16 (09/06 1420) BP: (119-157)/(47-82) 138/51 mmHg (09/06 1420) SpO2:  [98 %-100 %] 100 % (09/06 1420) Weight:  [61.6 kg (135 lb 12.9 oz)] 61.6 kg (135 lb 12.9 oz) (09/06 0412)  Physical Exam: BP Readings from Last 1 Encounters:  12/28/14 138/51    Wt Readings from Last 1 Encounters:  12/28/14 61.6 kg (135 lb 12.9 oz)    Weight change: -0.907 kg (-2 lb)  HEENT: [place]/AT, Eyes- PERL, EOMI, Conjunctiva-Pale pink, Sclera-Non-icteric Neck: + JVD at 20 degree angle, No bruit, Trachea midline. Lungs:  Clear, Bilateral. Cardiac:  Regular rhythm, normal S1 and S2, no S3. Gr III/VI systolic murmur. Abdomen:  Soft, non-tender. Extremities:  No edema present. No cyanosis. No clubbing. CNS: AxOx3, Cranial nerves grossly intact, moves all 4 extremities. Right handed. Skin: Warm and dry.   Intake/Output from previous day: 09/05 0701 - 09/06 0700 In: 765 [P.O.:540; I.V.:225] Out: 1480 [Urine:1480]    Lab Results: BMET    Component Value Date/Time   NA 140 12/28/2014 0346   NA 139 12/27/2014 0736   NA 139 12/26/2014 2037   NA 144 09/01/2014 1445   NA 145* 05/31/2014 1434   NA 140 10/15/2013 1657   K 3.8 12/28/2014 0346   K 3.6 12/27/2014 0736   K 3.9 12/26/2014 2037   CL 110 12/28/2014 0346   CL 108 12/27/2014 0736   CL 105 12/26/2014 2037   CO2 22 12/28/2014 0346   CO2 22 12/27/2014 0736    CO2 22 12/26/2014 2037   GLUCOSE 142* 12/28/2014 0346   GLUCOSE 124* 12/27/2014 0736   GLUCOSE 160* 12/26/2014 2037   GLUCOSE 109* 09/01/2014 1445   GLUCOSE 218* 05/31/2014 1434   GLUCOSE 146* 10/15/2013 1657   BUN 28* 12/28/2014 0346   BUN 24* 12/27/2014 0736   BUN 23* 12/26/2014 2037   BUN 25 09/01/2014 1445   BUN 19 05/31/2014 1434   BUN 31* 10/15/2013 1657   CREATININE 1.02* 12/28/2014 0346   CREATININE 0.95 12/27/2014 0736   CREATININE 1.06* 12/26/2014 2037   CALCIUM 8.1* 12/28/2014 0346   CALCIUM 8.3* 12/27/2014 0736   CALCIUM 9.7 12/26/2014 2037   GFRNONAA 52* 12/28/2014 0346   GFRNONAA 56* 12/27/2014 0736   GFRNONAA 49* 12/26/2014 2037   GFRAA 60* 12/28/2014 0346   GFRAA >60 12/27/2014 0736   GFRAA 57* 12/26/2014 2037   CBC    Component Value Date/Time   WBC 8.6 12/28/2014 0346   WBC 8.7 07/23/2013 1243   RBC 3.74* 12/28/2014 0346   RBC 4.59 07/23/2013 1243   RBC 4.68 05/24/2011 2045   HGB 10.9* 12/28/2014 0346   HCT 33.6* 12/28/2014 0346   PLT 185 12/28/2014 0346   MCV 89.8 12/28/2014 0346   MCH 29.1 12/28/2014 0346   MCH 29.0 07/23/2013 1243   MCHC 32.4 12/28/2014 0346   MCHC 32.6 07/23/2013 1243   RDW 13.6  12/28/2014 0346   RDW 14.1 07/23/2013 1243   LYMPHSABS 2.1 12/27/2014 0736   LYMPHSABS 1.5 07/23/2013 1243   MONOABS 1.1* 12/27/2014 0736   EOSABS 0.1 12/27/2014 0736   EOSABS 0.1 07/23/2013 1243   BASOSABS 0.1 12/27/2014 0736   BASOSABS 0.1 07/23/2013 1243   HEPATIC Function Panel  Recent Labs  12/26/14 2037 12/27/14 0736 12/28/14 0346  PROT 7.2 5.4* 5.1*   HEMOGLOBIN A1C No components found for: HGA1C,  MPG CARDIAC ENZYMES Lab Results  Component Value Date   CKTOTAL 1151* 12/28/2014   TROPONINI 0.28* 12/27/2014   TROPONINI 0.32* 12/27/2014   TROPONINI 0.49* 12/27/2014   BNP No results for input(s): PROBNP in the last 8760 hours. TSH No results for input(s): TSH in the last 8760 hours. CHOLESTEROL  Recent Labs   05/31/14 1434  CHOL 193    Scheduled Meds: . feeding supplement (GLUCERNA SHAKE)  237 mL Oral Q1500  . furosemide  20 mg Intravenous Once  . insulin aspart  0-9 Units Subcutaneous TID WC  . linagliptin  5 mg Oral Daily  . mirabegron ER  25 mg Oral Daily  . mometasone-formoterol  2 puff Inhalation BID  . rivaroxaban  15 mg Oral Q supper   Continuous Infusions: . sodium chloride 80 mL/hr at 12/28/14 1300   PRN Meds:.acetaminophen **OR** acetaminophen, meclizine, ondansetron **OR** ondansetron (ZOFRAN) IV  Assessment/Plan: Recurrent fall Chronic Atrial fibrillation S/P Pacemaker placement Abnormal cardiac enzymes Possible CAD COPD Hypertension Anemia, etiology unknown Severe MR and TR Hypoalbuminemia   Decrease IV fluids by 50 %. Use small dose IV lasix. Check stool for occult blood.  Iron level, ferritin are pending. Elevated CK and minimally elevated Troponin-I are trending downward. Cardiac catheterization in near future, if renal function remains stable. Made aware of short term wheel-chair use for ambulation. May need lumbar spine evaluation for bilateral lower leg weakness in near future.    LOS: 1 day    Dixie Dials  MD  12/28/2014, 7:38 PM

## 2014-12-29 DIAGNOSIS — I10 Essential (primary) hypertension: Secondary | ICD-10-CM

## 2014-12-29 DIAGNOSIS — I482 Chronic atrial fibrillation: Secondary | ICD-10-CM

## 2014-12-29 DIAGNOSIS — M6282 Rhabdomyolysis: Principal | ICD-10-CM

## 2014-12-29 DIAGNOSIS — E119 Type 2 diabetes mellitus without complications: Secondary | ICD-10-CM

## 2014-12-29 LAB — GLUCOSE, CAPILLARY
GLUCOSE-CAPILLARY: 135 mg/dL — AB (ref 65–99)
GLUCOSE-CAPILLARY: 139 mg/dL — AB (ref 65–99)
Glucose-Capillary: 129 mg/dL — ABNORMAL HIGH (ref 65–99)
Glucose-Capillary: 153 mg/dL — ABNORMAL HIGH (ref 65–99)

## 2014-12-29 LAB — COMPREHENSIVE METABOLIC PANEL
ALT: 67 U/L — AB (ref 14–54)
AST: 98 U/L — AB (ref 15–41)
Albumin: 3 g/dL — ABNORMAL LOW (ref 3.5–5.0)
Alkaline Phosphatase: 87 U/L (ref 38–126)
Anion gap: 10 (ref 5–15)
BILIRUBIN TOTAL: 0.7 mg/dL (ref 0.3–1.2)
BUN: 19 mg/dL (ref 6–20)
CALCIUM: 8.6 mg/dL — AB (ref 8.9–10.3)
CHLORIDE: 105 mmol/L (ref 101–111)
CO2: 24 mmol/L (ref 22–32)
CREATININE: 1.04 mg/dL — AB (ref 0.44–1.00)
GFR, EST AFRICAN AMERICAN: 59 mL/min — AB (ref >60)
GFR, EST NON AFRICAN AMERICAN: 50 mL/min — AB (ref >60)
Glucose, Bld: 126 mg/dL — ABNORMAL HIGH (ref 65–99)
Potassium: 3.6 mmol/L (ref 3.5–5.1)
Sodium: 139 mmol/L (ref 135–145)
TOTAL PROTEIN: 5.6 g/dL — AB (ref 6.5–8.1)

## 2014-12-29 LAB — IRON AND TIBC
Iron: 25 ug/dL — ABNORMAL LOW (ref 28–170)
Saturation Ratios: 9 % — ABNORMAL LOW (ref 10.4–31.8)
TIBC: 288 ug/dL (ref 250–450)
UIBC: 263 ug/dL

## 2014-12-29 LAB — VITAMIN B12: VITAMIN B 12: 598 pg/mL (ref 180–914)

## 2014-12-29 LAB — RETICULOCYTES
RBC.: 3.77 MIL/uL — AB (ref 3.87–5.11)
RETIC COUNT ABSOLUTE: 52.8 10*3/uL (ref 19.0–186.0)
RETIC CT PCT: 1.4 % (ref 0.4–3.1)

## 2014-12-29 LAB — CBC
HCT: 33.7 % — ABNORMAL LOW (ref 36.0–46.0)
HEMOGLOBIN: 11 g/dL — AB (ref 12.0–15.0)
MCH: 29.2 pg (ref 26.0–34.0)
MCHC: 32.6 g/dL (ref 30.0–36.0)
MCV: 89.4 fL (ref 78.0–100.0)
PLATELETS: 206 10*3/uL (ref 150–400)
RBC: 3.77 MIL/uL — ABNORMAL LOW (ref 3.87–5.11)
RDW: 13.5 % (ref 11.5–15.5)
WBC: 9 10*3/uL (ref 4.0–10.5)

## 2014-12-29 LAB — AMMONIA: AMMONIA: 44 umol/L — AB (ref 9–35)

## 2014-12-29 LAB — HIV ANTIBODY (ROUTINE TESTING W REFLEX): HIV SCREEN 4TH GENERATION: NONREACTIVE

## 2014-12-29 LAB — FOLATE: FOLATE: 33 ng/mL (ref >5.9)

## 2014-12-29 LAB — FERRITIN: Ferritin: 273 ng/mL (ref 11–307)

## 2014-12-29 LAB — CK: CK TOTAL: 999 U/L — AB (ref 38–234)

## 2014-12-29 LAB — RPR: RPR: NONREACTIVE

## 2014-12-29 MED ORDER — FERROUS SULFATE 325 (65 FE) MG PO TABS
325.0000 mg | ORAL_TABLET | Freq: Two times a day (BID) | ORAL | Status: DC
  Administered 2014-12-30 (×2): 325 mg via ORAL
  Filled 2014-12-29 (×2): qty 1

## 2014-12-29 NOTE — Progress Notes (Signed)
Occupational Therapy Evaluation Patient Details Name: Erin Vang MRN: [id number] DOB: 1936-01-20 Today's Date: 12/29/2014    History of Present Illness 79 y.o. female with a history of chronic atrial fibrillation/tachybradycardia syndrome status post AV nodal ablation and pacemaker placement, hypertension, hyperlipidemia and diabetes mellitus was brought to the ER to patient had 2 falls within the last 24 hours   Clinical Impression   PTA, pt lived alone and was mod I with mobility and independent with ADL. Pt will benefit from rehab at SNF. Pt may be appropriate for ALF after SNF. Pt states she has had "bunches" of falls. Will follow acutely to address established goals.     Follow Up Recommendations  SNF;Supervision/Assistance - 24 hour    Equipment Recommendations  3 in 1 bedside comode    Recommendations for Other Services       Precautions / Restrictions Precautions Precautions: Fall Precaution Comments: h/o multiple falls, has lifeline but wasn't wearing it when she fell in driveway prior to admiission Restrictions Weight Bearing Restrictions: No      Mobility Bed Mobility Overal bed mobility: Modified Independent                Transfers Overall transfer level: Needs assistance Equipment used: Rolling walker (2 wheeled) Transfers: Sit to/from Stand Sit to Stand: Min assist         General transfer comment: steady A. Pt stating that she has a fear of falling agian.    Balance Overall balance assessment: History of Falls ("bunches of falls")   Sitting balance-Leahy Scale: Good                                      ADL Overall ADL's : Needs assistance/impaired     Grooming: Set up;Sitting   Upper Body Bathing: Set up;Sitting   Lower Body Bathing: Minimal assistance;Sit to/from stand   Upper Body Dressing : Set up;Sitting   Lower Body Dressing: Minimal assistance;Sit to/from stand   Toilet Transfer: Minimal  assistance;Stand-pivot   Toileting- Clothing Manipulation and Hygiene: Minimal assistance       Functional mobility during ADLs: Minimal assistance;Rolling walker General ADL Comments: affected by general weakness     Vision     Perception     Praxis      Pertinent Vitals/Pain Pain Assessment: No/denies pain     Hand Dominance     Extremity/Trunk Assessment Upper Extremity Assessment Upper Extremity Assessment: Generalized weakness   Lower Extremity Assessment Lower Extremity Assessment: Generalized weakness   Cervical / Trunk Assessment Cervical / Trunk Assessment: Kyphotic   Communication Communication Communication: No difficulties   Cognition Arousal/Alertness: Awake/alert Behavior During Therapy: WFL for tasks assessed/performed Overall Cognitive Status: No family/caregiver present to determine baseline cognitive functioning                     General Comments       Exercises       Shoulder Instructions      Home Living Family/patient expects to be discharged to:: Skilled nursing facility                                        Prior Functioning/Environment Level of Independence: Independent with assistive device(s)        Comments: pt has assist for some housework,  shopping and driving. Is supposed to use RW per son but tends to carry it    OT Diagnosis: Generalized weakness   OT Problem List: Decreased strength;Decreased activity tolerance;Impaired balance (sitting and/or standing);Decreased safety awareness   OT Treatment/Interventions: Self-care/ADL training;Therapeutic exercise;DME and/or AE instruction;Therapeutic activities;Patient/family education;Balance training    OT Goals(Current goals can be found in the care plan section) Acute Rehab OT Goals Patient Stated Goal: return home after rehab OT Goal Formulation: With patient Time For Goal Achievement: 01/12/15 Potential to Achieve Goals: Good  OT Frequency:  Min 2X/week   Barriers to D/C:            Co-evaluation              End of Session Nurse Communication: Mobility status  Activity Tolerance: Patient tolerated treatment well Patient left: in bed;with call bell/phone within reach;with bed alarm set   Time: 8242-3536 OT Time Calculation (min): 15 min Charges:  OT General Charges $OT Visit: 1 Procedure OT Evaluation $Initial OT Evaluation Tier I: 1 Procedure G-Codes:    Erin Vang,Erin Vang 2014-12-31, 4:53 PM  [facility], OTR/L  [phone] 12/31/2014

## 2014-12-29 NOTE — Clinical Social Work Note (Signed)
Clinical Social Work Assessment  Patient Details  Name: Erin Vang MRN: [id number] Date of Birth: [birth year]  Date of referral:  12/28/14               Reason for consult:  Facility Placement                Permission sought to share information with:  Family Supports, Chartered certified accountant granted to share information::  Yes, Verbal Permission Granted  Name::     [facility] SNF's, Son Erin Vang and Erin Vang::     Relationship::     Contact Information:     Housing/Transportation Living arrangements for the past 2 months:  Single Family Home Source of Information:  Patient, Adult Children Patient Interpreter Needed:  None Criminal Activity/Legal Involvement Pertinent to Current Situation/Hospitalization:  No - Comment as needed Significant Relationships:  Adult Children, Neighbor (Neighbor is primary help person for patient) Lives with:  Self Do you feel safe going back to the place where you live?  Yes Need for family participation in patient care:  Yes (Comment)  Care giving concerns: PT recommends short term SNF and patient was concerned about this d/c plan. Lives alone- states only home support is a neighbor who "checks on me, gets my mail and groceries."   Facilities manager / plan:  CSW met with patient to discuss PT's recommendation for SNF placement.  While reluctant at first to consider placement- after consideration of benefits of SNF- she is now in agreement.  She stated that she has a son Erin Vang who is her HCPOA and her daughter Erin Vang. She stated that they assist her when the are able but her neighbor is her primary support for home services.  Patient agreed to SNF search and ok for her son/daughter to be involved in her d/c plan. Will complete Fl2 and place on chart for MD's signature.  Employment status:  Retired Forensic scientist:  Medicare PT Recommendations:  [place] / Referral to community  resources:  [place]  Patient/Family's Response to care:  Patient was smiling and more relaxed after talking with CSW about the benefits of short term rehab. She wanted reassurance that she would only be placed short term.    Patient/Family's Understanding of and Emotional Response to Diagnosis, Current Treatment, and Prognosis:  Patient appears to have a fair understanding of the reason for her admission, diagnosis and post d/c needs.  She originally thought she would be "fine" at home but was amenable to discussing therapy options.  Emotional Assessment Appearance:  Appears stated age Attitude/Demeanor/Rapport:  Apprehensive, Guarded Affect (typically observed):  Guarded, Pleasant, Apprehensive Orientation:  Oriented to Self, Oriented to Place, Oriented to  Time, Oriented to Situation Alcohol / Substance use:  Tobacco Use (Former smoker 28 years ago) Psych involvement (Current and /or in the community):  No (Comment)  Discharge Needs  Concerns to be addressed:  Care Coordination Readmission within the last 30 days:  No Current discharge risk:  Lives alone, Dependent with Mobility (Limited support at home) Barriers to Discharge:  Continued Medical Work up   Kendell Bane T, Marlinda Mike 12/28/2014  2:10 PM

## 2014-12-29 NOTE — Progress Notes (Signed)
[place] TEAM 12 . PROGRESS NOTE  KATHLINE BANBURY JJK:[id number] DOB: 01/04/1936 DOA: 12/26/2014 PCP: Estill Dooms, MD  Admit HPI / Brief Narrative:   79 y.o. female with a history of chronic atrial fibrillation/tachybradycardia syndrome status post AV nodal ablation and pacemaker placement, hypertension, hyperlipidemia and diabetes mellitus was brought to the ER after she had 2 falls within 24 hours. As per patient's daughter patient had a fall while walking in the street. Later in the evening patient again had a fall and was brought to the ER. CT of the head did not show anything acute. Labs revealed rhabdomyolysis with mildly elevated troponin. Patient has history of gait instability and had recently been started on medications for dizziness and urinary incontinence.   HPI/Subjective:  Patient in bed, denies any fever or chills, no headache. Denies any chest or abdominal pain. No shortness of breath or focal weakness.  Assessment/Plan:  Fall with mild rhabdomyolysis CK slowly improving with gently hydration - cont to follow   Acute renal failure Due to dehydration + rhabdo - crt slow to improve - cont to hydrate and follow trend   Diabetes mellitus type 2 controlled CBG currently reasonably controlled - follow trend on combination of trajemta and sliding scale.  Elevated troponin No chest pain, stress test positive for anterior wall reversible moderate ischemia. Per cardiology left heart cath.  Hypertension BP currently reasonably controlled - follow trend   COPD  Well compensated at this time - follow clinically   Memory deficit - gait instability  Pt tells me she lives alone - I suspect it is no longer safe for her to do so due to what appears to be progressive dementia - check B12 and folate - check RPR - PT/OT evals   Chronic atrial fibrillation status post ablation/pacemaker chads 2 vasc score more than 2 - was on Xarelto but with multiple recent falls may not be  appropriate for ongoing use as outpt - cont while in hospital, but await PT/OT evals to determine safety of continuing after d/c   Bilateral elbow and knee skin tears from fall tetanus booster - x-ray pelvis is negative for fracture - pt denies focal pain   Isolated AST elevation ?EtOH abuse - pt denies - follow clinically for evidence of withdrawal    Code Status: DNR Family Communication: no family present at time of exam Disposition Plan: tele - PT/OT evals - SNF   Consultants: Cardiology   Procedures: Nuc Med stress test - positive for moderate reversible ischemia  TTE 9/5 -  - Left ventricle: The cavity size was normal. There was mildconcentric hypertrophy. Systolic function was vigorous. Theestimated ejection fraction was in the range of 65% to 70%. Wallmotion was normal; there were no regional wall motionabnormalities. Doppler parameters are consistent with abnormalleft ventricular relaxation (grade 1 diastolic dysfunction). - Aortic valve: There was trivial regurgitation. - Mitral valve: There was severe regurgitation. - Left atrium: The atrium was moderately dilated. - Right ventricle: The cavity size was mildly dilated. Wallthickness was normal. - Right atrium: The atrium was moderately to severely dilated. - Tricuspid valve: There was severe regurgitation. - Pulmonary arteries: Systolic pressure was mildly to moderatelyincreased. PA peak pressure: 40 mm Hg (S).   Antibiotics: none  DVT prophylaxis: Xarelto   Objective: Blood pressure 141/60, pulse 76, temperature 98.1 F (36.7 C), temperature source Oral, resp. rate 18, height 5\' 5"  (1.651 m), weight 62.37 kg (137 lb 8 oz), SpO2 98 %.  Intake/Output Summary (Last 24  hours) at 12/29/14 1202 Last data filed at 12/29/14 1148  Gross per 24 hour  Intake   1600 ml  Output   2601 ml  Net  -1001 ml   Exam: General: No acute respiratory distress - alert but not fully oriented  Lungs: Clear to auscultation  bilaterally without wheezes or crackles Cardiovascular: Regular rate without murmur gallop or rub normal S1 and S2 Abdomen: Nontender, nondistended, soft, bowel sounds positive, no rebound, no ascites, no appreciable mass Extremities: No significant cyanosis, clubbing, or edema bilateral lower extremities  Data Reviewed: Basic Metabolic Panel:  Recent Labs Lab 12/26/14 2037 12/27/14 0736 12/28/14 0346 12/29/14 0346  NA 139 139 140 139  K 3.9 3.6 3.8 3.6  CL 105 108 110 105  CO2 22 22 22 24   GLUCOSE 160* 124* 142* 126*  BUN 23* 24* 28* 19  CREATININE 1.06* 0.95 1.02* 1.04*  CALCIUM 9.7 8.3* 8.1* 8.6*    CBC:  Recent Labs Lab 12/26/14 2037 12/27/14 0736 12/28/14 0346 12/29/14 0346  WBC 15.2* 9.8 8.6 9.0  NEUTROABS 12.6* 6.5  --   --   HGB 13.7 11.7* 10.9* 11.0*  HCT 40.4 34.8* 33.6* 33.7*  MCV 87.8 89.2 89.8 89.4  PLT 267 198 185 206    Liver Function Tests:  Recent Labs Lab 12/26/14 2037 12/27/14 0736 12/28/14 0346 12/29/14 0346  AST 124* 93* 85* 98*  ALT 49 41 44 67*  ALKPHOS 84 63 65 87  BILITOT 1.2 0.9 1.0 0.7  PROT 7.2 5.4* 5.1* 5.6*  ALBUMIN 3.9 2.9* 2.8* 3.0*    Cardiac Enzymes:  Recent Labs Lab 12/27/14 0045 12/27/14 0736 12/27/14 1310 12/27/14 1814 12/28/14 0346 12/29/14 0346  CKTOTAL 2540* 1860*  --   --  1151* 999*  TROPONINI 0.51* 0.49* 0.32* 0.28*  --   --     CBG:  Recent Labs Lab 12/28/14 1135 12/28/14 1733 12/28/14 2225 12/29/14 0608 12/29/14 1110  GLUCAP 170* 94 135* 129* 139*    Studies:   Recent x-ray studies have been reviewed in detail by the Attending Physician  Scheduled Meds:  Scheduled Meds: . feeding supplement (GLUCERNA SHAKE)  237 mL Oral Q1500  . insulin aspart  0-9 Units Subcutaneous TID WC  . linagliptin  5 mg Oral Daily  . mirabegron ER  25 mg Oral Daily  . mometasone-formoterol  2 puff Inhalation BID  . rivaroxaban  15 mg Oral Q supper    Time spent on care of this patient: 35  mins  Lala Lund K M.D on 12/29/2014 at 12:02 PM  Between 7am to 7pm - Pager - [phone], After 7pm go to [email or web address] - password [email or web address]  [phone]   LOS: 2 days

## 2014-12-29 NOTE — Progress Notes (Signed)
CSW received several calls today from daughter [name] re: SNF search. She stated that they prefer [facility] if possible for patient's admission and wanted to make sure that CSW knew that her mother would probably   Undergo a heart catherization prior to d/c to SNF.  CSW provided support and reassurance to daughter and related that MD would make decisions re: procedures or tests and would notify CSW when he felt patient was medically stable for d/c. Daughter stated this was "reassuring" as she wants to insure that her mother receives the care that she needs to get better.  Active bed search in place;  [facility] "E" assigned to patient.  Lorie Phenix. Pauline Good, [place]

## 2014-12-29 NOTE — Clinical Social Work Placement (Addendum)
   CLINICAL SOCIAL WORK PLACEMENT  NOTE  Date:  12/29/2014  Patient Details  Name: Erin Vang MRN: [id number] Date of Birth: 09-10-1935  Clinical Social Work is seeking post-discharge placement for this patient at the [place] level of care (*CSW will initial, date and re-position this form in  chart as items are completed):  Yes   Patient/family provided with [place] Work Department's list of facilities offering this level of care within the geographic area requested by the patient (or if unable, by the patient's family).  Yes   Patient/family informed of their freedom to choose among providers that offer the needed level of care, that participate in Medicare, Medicaid or managed care program needed by the patient, have an available bed and are willing to accept the patient.  Yes   Patient/family informed of [place]'s ownership interest in [facility] and [facility], as well as of the fact that they are under no obligation to receive care at these facilities.  PASRR submitted to EDS on       PASRR number received on       Existing PASRR number confirmed on       FL2 transmitted to all facilities in geographic area requested by pt/family on       FL2 transmitted to all facilities within larger geographic area on       Patient informed that his/her managed care company has contracts with or will negotiate with certain facilities, including the following:            Patient/family informed of bed offers received.  Patient chooses bed at       Physician recommends and patient chooses bed at      Patient to be transferred to   on  .  Patient to be transferred to facility by [facility])     Patient family notified on   of transfer.  Name of family member notified:        PHYSICIAN Please sign DNR, Please prepare prescriptions, Please sign FL2, Please prepare priority discharge summary, including medications      Additional Comment:    _______________________________________________ Williemae Area, LCSW 12/28/2014  2:20 PM

## 2014-12-29 NOTE — Progress Notes (Signed)
Physical Therapy Treatment Patient Details Name: Erin Vang MRN: [id number] DOB: 03/02/1936 Today's Date: 12/29/2014    History of Present Illness 79 y.o. female with a history of chronic atrial fibrillation/tachybradycardia syndrome status post AV nodal ablation and pacemaker placement, hypertension, hyperlipidemia and diabetes mellitus was brought to the ER to patient had 2 falls within the last 24 hours    PT Comments    Improved negotiation of obstacles today when walking. Pt requires min A for sit to stand and remains a high fall risk. Pt ambulated 180' with RW and performed dynamic standing balance activities. SNF recommended.   Follow Up Recommendations  SNF;Supervision/Assistance - 24 hour     Equipment Recommendations  None recommended by PT    Recommendations for Other Services       Precautions / Restrictions Precautions Precautions: Fall Precaution Comments: h/o multiple falls, has lifeline but wasn't wearing it when she fell in driveway prior to admiission Restrictions Weight Bearing Restrictions: No    Mobility  Bed Mobility Overal bed mobility: Modified Independent Bed Mobility: Supine to Sit     Supine to sit: Modified independent (Device/Increase time)     General bed mobility comments: used bedrail, HOB up 50*  Transfers Overall transfer level: Needs assistance Equipment used: Rolling walker (2 wheeled) Transfers: Sit to/from Stand Sit to Stand: Min assist         General transfer comment: min A for anterior translation and to rise, verbal cues for hand placement  Ambulation/Gait Ambulation/Gait assistance: Min guard Ambulation Distance (Feet): 180 Feet Assistive device: Rolling walker (2 wheeled) Gait Pattern/deviations: Step-through pattern;Decreased stride length     General Gait Details: no LOB, avoided obstacles well, HR 86 with walking   Stairs            Wheelchair Mobility    Modified Rankin (Stroke Patients Only)        Balance     Sitting balance-Leahy Scale: Good       Standing balance-Leahy Scale: Poor               High level balance activites: Side stepping;Braiding High Level Balance Comments: side stepping 10' x 4 with 2 hand held assist, braiding 10' x 2 with 2 hand held assist, reaching over BOS in standing x 3, standing with feet together with min A, unilateral stance with mod assist    Cognition Arousal/Alertness: Awake/alert Behavior During Therapy: WFL for tasks assessed/performed Overall Cognitive Status: Within Functional Limits for tasks assessed                      Exercises      General Comments        Pertinent Vitals/Pain Pain Assessment: No/denies pain    Home Living                      Prior Function            PT Goals (current goals can now be found in the care plan section) Acute Rehab PT Goals Patient Stated Goal: return home after rehab PT Goal Formulation: With patient Time For Goal Achievement: 01/10/15 Potential to Achieve Goals: Fair Progress towards PT goals: Progressing toward goals    Frequency  Min 3X/week    PT Plan Current plan remains appropriate    Co-evaluation             End of Session Equipment Utilized During Treatment: Gait belt Activity Tolerance: Patient tolerated  treatment well Patient left: in chair;with call bell/phone within reach;with chair alarm set     Time: 5320-2334 PT Time Calculation (min) (ACUTE ONLY): 32 min  Charges:  $Gait Training: 8-22 mins $Therapeutic Activity: 8-22 mins                    G Codes:      Philomena Doheny 12/29/2014, 10:19 AM [phone]

## 2014-12-29 NOTE — Consult Note (Signed)
Ref: GREEN, Viviann Spare, MD   Subjective:  Feeling better. Hgb at 11.0. Iron level low at 25.   Objective:  Vital Signs in the last 24 hours: Temp:  [98.1 F (36.7 C)-98.7 F (37.1 C)] 98.5 F (36.9 C) (09/07 1346) Pulse Rate:  [71-81] 75 (09/07 1346) Cardiac Rhythm:  [-] Ventricular paced (09/07 0700) Resp:  [18-20] 20 (09/07 1346) BP: (140-152)/(60-65) 152/65 mmHg (09/07 1346) SpO2:  [98 %-100 %] 100 % (09/07 1346) Weight:  [62.37 kg (137 lb 8 oz)] 62.37 kg (137 lb 8 oz) (09/07 0423)  Physical Exam: BP Readings from Last 1 Encounters:  12/29/14 152/65    Wt Readings from Last 1 Encounters:  12/29/14 62.37 kg (137 lb 8 oz)    Weight change: -0.181 kg (-6.4 oz)  HEENT: [place]/AT, Eyes- PERL, EOMI, Conjunctiva-Pale pink, Sclera-Non-icteric Neck: No JVD, No bruit, Trachea midline. Lungs:  Clear, Bilateral. Cardiac:  Regular rhythm, normal S1 and S2, no S3. Gr. III/VI systolic murmur. Abdomen:  Soft, non-tender. Extremities:  No edema present. No cyanosis. No clubbing. CNS: AxOx3, Cranial nerves grossly intact, moves all 4 extremities. Right handed. Skin: Warm and dry.   Intake/Output from previous day: 09/06 0701 - 09/07 0700 In: 1360 [P.O.:360; I.V.:1000] Out: 2501 [Urine:2501]    Lab Results: BMET    Component Value Date/Time   NA 139 12/29/2014 0346   NA 140 12/28/2014 0346   NA 139 12/27/2014 0736   NA 144 09/01/2014 1445   NA 145* 05/31/2014 1434   NA 140 10/15/2013 1657   K 3.6 12/29/2014 0346   K 3.8 12/28/2014 0346   K 3.6 12/27/2014 0736   CL 105 12/29/2014 0346   CL 110 12/28/2014 0346   CL 108 12/27/2014 0736   CO2 24 12/29/2014 0346   CO2 22 12/28/2014 0346   CO2 22 12/27/2014 0736   GLUCOSE 126* 12/29/2014 0346   GLUCOSE 142* 12/28/2014 0346   GLUCOSE 124* 12/27/2014 0736   GLUCOSE 109* 09/01/2014 1445   GLUCOSE 218* 05/31/2014 1434   GLUCOSE 146* 10/15/2013 1657   BUN 19 12/29/2014 0346   BUN 28* 12/28/2014 0346   BUN 24* 12/27/2014 0736   BUN 25 09/01/2014 1445   BUN 19 05/31/2014 1434   BUN 31* 10/15/2013 1657   CREATININE 1.04* 12/29/2014 0346   CREATININE 1.02* 12/28/2014 0346   CREATININE 0.95 12/27/2014 0736   CALCIUM 8.6* 12/29/2014 0346   CALCIUM 8.1* 12/28/2014 0346   CALCIUM 8.3* 12/27/2014 0736   GFRNONAA 50* 12/29/2014 0346   GFRNONAA 52* 12/28/2014 0346   GFRNONAA 56* 12/27/2014 0736   GFRAA 59* 12/29/2014 0346   GFRAA 60* 12/28/2014 0346   GFRAA >60 12/27/2014 0736   CBC    Component Value Date/Time   WBC 9.0 12/29/2014 0346   WBC 8.7 07/23/2013 1243   RBC 3.77* 12/29/2014 0346   RBC 3.77* 12/29/2014 0346   RBC 4.59 07/23/2013 1243   HGB 11.0* 12/29/2014 0346   HCT 33.7* 12/29/2014 0346   PLT 206 12/29/2014 0346   MCV 89.4 12/29/2014 0346   MCH 29.2 12/29/2014 0346   MCH 29.0 07/23/2013 1243   MCHC 32.6 12/29/2014 0346   MCHC 32.6 07/23/2013 1243   RDW 13.5 12/29/2014 0346   RDW 14.1 07/23/2013 1243   LYMPHSABS 2.1 12/27/2014 0736   LYMPHSABS 1.5 07/23/2013 1243   MONOABS 1.1* 12/27/2014 0736   EOSABS 0.1 12/27/2014 0736   EOSABS 0.1 07/23/2013 1243   BASOSABS 0.1 12/27/2014 0736   BASOSABS  0.1 07/23/2013 1243   HEPATIC Function Panel  Recent Labs  12/27/14 0736 12/28/14 0346 12/29/14 0346  PROT 5.4* 5.1* 5.6*   HEMOGLOBIN A1C No components found for: HGA1C,  MPG CARDIAC ENZYMES Lab Results  Component Value Date   CKTOTAL 999* 12/29/2014   TROPONINI 0.28* 12/27/2014   TROPONINI 0.32* 12/27/2014   TROPONINI 0.49* 12/27/2014   BNP No results for input(s): PROBNP in the last 8760 hours. TSH No results for input(s): TSH in the last 8760 hours. CHOLESTEROL  Recent Labs  05/31/14 1434  CHOL 193    Scheduled Meds: . feeding supplement (GLUCERNA SHAKE)  237 mL Oral Q1500  . [START ON 12/30/2014] ferrous sulfate  325 mg Oral BID WC  . insulin aspart  0-9 Units Subcutaneous TID WC  . linagliptin  5 mg Oral Daily  . mirabegron ER  25 mg Oral Daily  . mometasone-formoterol   2 puff Inhalation BID  . rivaroxaban  15 mg Oral Q supper   Continuous Infusions:  PRN Meds:.acetaminophen **OR** acetaminophen, meclizine, ondansetron **OR** ondansetron (ZOFRAN) IV  Assessment/Plan: Recurrent fall Chronic Atrial fibrillation S/P Pacemaker placement Abnormal cardiac enzymes Possible CAD COPD Hypertension Anemia, etiology unknown Severe MR and TR Hypoalbuminemia  Continue medical treatment. Cardiac cath IP or OP.    LOS: 2 days    Dixie Dials  MD  12/29/2014, 8:16 PM

## 2014-12-29 NOTE — Clinical Social Work Placement (Signed)
   CLINICAL SOCIAL WORK PLACEMENT  NOTE  Date:  12/29/2014  Patient Details  Name: Erin Vang MRN: [id number] Date of Birth: 1935-07-03  Clinical Social Work is seeking post-discharge placement for this patient at the [place] level of care (*CSW will initial, date and re-position this form in  chart as items are completed):  Yes   Patient/family provided with [place] Work Department's list of facilities offering this level of care within the geographic area requested by the patient (or if unable, by the patient's family).  Yes   Patient/family informed of their freedom to choose among providers that offer the needed level of care, that participate in Medicare, Medicaid or managed care program needed by the patient, have an available bed and are willing to accept the patient.  Yes   Patient/family informed of [place]'s ownership interest in [facility] and [facility], as well as of the fact that they are under no obligation to receive care at these facilities.  PASRR submitted to EDS on 12/29/14     PASRR number received on 12/29/14     Existing PASRR number confirmed on       FL2 transmitted to all facilities in geographic area requested by pt/family on 12/29/14     FL2 transmitted to all facilities within larger geographic area on       Patient informed that his/her managed care company has contracts with or will negotiate with certain facilities, including the following:            Patient/family informed of bed offers received.  Patient chooses bed at       Physician recommends and patient chooses bed at      Patient to be transferred to   on  .  Patient to be transferred to facility by [facility])     Patient family notified on   of transfer.  Name of family member notified:        PHYSICIAN Please sign DNR, Please prepare prescriptions, Please sign FL2, Please prepare priority discharge summary, including  medications     Additional Comment:    _______________________________________________ Williemae Area, LCSW 12/29/2014, 3:05 PM

## 2014-12-30 DIAGNOSIS — J410 Simple chronic bronchitis: Secondary | ICD-10-CM

## 2014-12-30 LAB — BASIC METABOLIC PANEL
Anion gap: 9 (ref 5–15)
BUN: 20 mg/dL (ref 6–20)
CHLORIDE: 108 mmol/L (ref 101–111)
CO2: 22 mmol/L (ref 22–32)
Calcium: 8.7 mg/dL — ABNORMAL LOW (ref 8.9–10.3)
Creatinine, Ser: 1.01 mg/dL — ABNORMAL HIGH (ref 0.44–1.00)
GFR calc Af Amer: >60 mL/min (ref >60)
GFR calc non Af Amer: 52 mL/min — ABNORMAL LOW (ref >60)
GLUCOSE: 140 mg/dL — AB (ref 65–99)
POTASSIUM: 3.8 mmol/L (ref 3.5–5.1)
Sodium: 139 mmol/L (ref 135–145)

## 2014-12-30 LAB — GLUCOSE, CAPILLARY
GLUCOSE-CAPILLARY: 130 mg/dL — AB (ref 65–99)
Glucose-Capillary: 234 mg/dL — ABNORMAL HIGH (ref 65–99)
Glucose-Capillary: 172 mg/dL — ABNORMAL HIGH (ref 65–99)
Glucose-Capillary: 182 mg/dL — ABNORMAL HIGH (ref 65–99)
Glucose-Capillary: 109 mg/dL — ABNORMAL HIGH (ref 65–99)

## 2014-12-30 MED ORDER — SODIUM CHLORIDE 0.9 % IJ SOLN
3.0000 mL | Freq: Two times a day (BID) | INTRAMUSCULAR | Status: DC
  Administered 2014-12-30: 3 mL via INTRAVENOUS

## 2014-12-30 MED ORDER — ASPIRIN 81 MG PO CHEW
81.0000 mg | CHEWABLE_TABLET | ORAL | Status: AC
  Administered 2014-12-31: 81 mg via ORAL
  Filled 2014-12-30: qty 1

## 2014-12-30 MED ORDER — SODIUM CHLORIDE 0.9 % IJ SOLN: 3.0000 mL | INTRAMUSCULAR | Status: DC | PRN

## 2014-12-30 MED ORDER — SODIUM CHLORIDE 0.9 % IV SOLN
INTRAVENOUS | Status: DC
  Administered 2014-12-31: 06:00:00 via INTRAVENOUS

## 2014-12-30 MED ORDER — SODIUM CHLORIDE 0.9 % IV SOLN: 250.0000 mL | INTRAVENOUS | Status: DC | PRN

## 2014-12-30 NOTE — Progress Notes (Signed)
[place] TEAM 12 . PROGRESS NOTE  Erin Vang:[id number] DOB: 02-Sep-1935 DOA: 12/26/2014 PCP: Estill Dooms, MD  Admit HPI / Brief Narrative:   79 y.o. female with a history of chronic atrial fibrillation/tachybradycardia syndrome status post AV nodal ablation and pacemaker placement, hypertension, hyperlipidemia and diabetes mellitus was brought to the ER after she had 2 falls within 24 hours. As per patient's daughter patient had a fall while walking in the street. Later in the evening patient again had a fall and was brought to the ER. CT of the head did not show anything acute. Labs revealed rhabdomyolysis with mildly elevated troponin. Patient has history of gait instability and had recently been started on medications for dizziness and urinary incontinence.   HPI/Subjective:  Patient in bed, denies any fever or chills, no headache. Denies any chest or abdominal pain. No shortness of breath or focal weakness.  Assessment/Plan:  Fall with mild rhabdomyolysis CK slowly improving with gently hydration - cont to follow   Acute renal failure Due to dehydration + rhabdo - crt slow to improve - cont to hydrate and follow trend   Diabetes mellitus type 2 controlled CBG currently reasonably controlled - follow trend on combination of trajemta and sliding scale.  Elevated troponin No chest pain, stress test positive for anterior wall reversible moderate ischemia. Per cardiology left heart cath in am.  Hypertension BP currently reasonably controlled - follow trend   COPD  Well compensated at this time - follow clinically   Memory deficit - gait instability  Pt tells me she lives alone - I suspect it is no longer safe for her to do so due to what appears to be progressive dementia - check B12 and folate - check RPR - PT/OT evals   Chronic atrial fibrillation status post ablation/pacemaker chads 2 vasc score more than 2 - was on Xarelto but with multiple recent falls may not be  appropriate for ongoing use as outpt - cont while in hospital, but await PT/OT evals to determine safety of continuing after d/c   Bilateral elbow and knee skin tears from fall tetanus booster - x-ray pelvis is negative for fracture - pt denies focal pain   Isolated AST elevation ?EtOH abuse - pt denies - follow clinically for evidence of withdrawal    Code Status: DNR Family Communication: no family present at time of exam Disposition Plan: tele - PT/OT evals - SNF  DVT prophylaxis xaralto/SCDs  Consultants: Cardiology   Procedures: Nuc Med stress test - positive for moderate reversible ischemia  TTE 9/5 -  - Left ventricle: The cavity size was normal. There was mildconcentric hypertrophy. Systolic function was vigorous. Theestimated ejection fraction was in the range of 65% to 70%. Wallmotion was normal; there were no regional wall motionabnormalities. Doppler parameters are consistent with abnormalleft ventricular relaxation (grade 1 diastolic dysfunction). - Aortic valve: There was trivial regurgitation. - Mitral valve: There was severe regurgitation. - Left atrium: The atrium was moderately dilated. - Right ventricle: The cavity size was mildly dilated. Wallthickness was normal. - Right atrium: The atrium was moderately to severely dilated. - Tricuspid valve: There was severe regurgitation. - Pulmonary arteries: Systolic pressure was mildly to moderatelyincreased. PA peak pressure: 40 mm Hg (S).   Antibiotics: none  DVT prophylaxis: Xarelto   Objective: Blood pressure 154/61, pulse 73, temperature 98.3 F (36.8 C), temperature source Oral, resp. rate 18, height 5\' 5"  (1.651 m), weight 62.46 kg (137 lb 11.2 oz), SpO2 99 %.  Intake/Output Summary (Last 24 hours) at 12/30/14 1042 Last data filed at 12/30/14 0952  Gross per 24 hour  Intake    940 ml  Output   1725 ml  Net   -785 ml   Exam: General: No acute respiratory distress - alert but not fully oriented    Lungs: Clear to auscultation bilaterally without wheezes or crackles Cardiovascular: Regular rate without murmur gallop or rub normal S1 and S2 Abdomen: Nontender, nondistended, soft, bowel sounds positive, no rebound, no ascites, no appreciable mass Extremities: No significant cyanosis, clubbing, or edema bilateral lower extremities  Data Reviewed: Basic Metabolic Panel:  Recent Labs Lab 12/26/14 2037 12/27/14 0736 12/28/14 0346 12/29/14 0346 12/30/14 0349  NA 139 139 140 139 139  K 3.9 3.6 3.8 3.6 3.8  CL 105 108 110 105 108  CO2 22 22 22 24 22   GLUCOSE 160* 124* 142* 126* 140*  BUN 23* 24* 28* 19 20  CREATININE 1.06* 0.95 1.02* 1.04* 1.01*  CALCIUM 9.7 8.3* 8.1* 8.6* 8.7*    CBC:  Recent Labs Lab 12/26/14 2037 12/27/14 0736 12/28/14 0346 12/29/14 0346  WBC 15.2* 9.8 8.6 9.0  NEUTROABS 12.6* 6.5  --   --   HGB 13.7 11.7* 10.9* 11.0*  HCT 40.4 34.8* 33.6* 33.7*  MCV 87.8 89.2 89.8 89.4  PLT 267 198 185 206    Liver Function Tests:  Recent Labs Lab 12/26/14 2037 12/27/14 0736 12/28/14 0346 12/29/14 0346  AST 124* 93* 85* 98*  ALT 49 41 44 67*  ALKPHOS 84 63 65 87  BILITOT 1.2 0.9 1.0 0.7  PROT 7.2 5.4* 5.1* 5.6*  ALBUMIN 3.9 2.9* 2.8* 3.0*    Cardiac Enzymes:  Recent Labs Lab 12/27/14 0045 12/27/14 0736 12/27/14 1310 12/27/14 1814 12/28/14 0346 12/29/14 0346  CKTOTAL 2540* 1860*  --   --  1151* 999*  TROPONINI 0.51* 0.49* 0.32* 0.28*  --   --     CBG:  Recent Labs Lab 12/28/14 2225 12/29/14 0608 12/29/14 1110 12/29/14 1605 12/30/14 0216  GLUCAP 135* 129* 139* 153* 234*    Studies:   Recent x-ray studies have been reviewed in detail by the Attending Physician  Scheduled Meds:  Scheduled Meds: . feeding supplement (GLUCERNA SHAKE)  237 mL Oral Q1500  . ferrous sulfate  325 mg Oral BID WC  . insulin aspart  0-9 Units Subcutaneous TID WC  . linagliptin  5 mg Oral Daily  . mirabegron ER  25 mg Oral Daily  .  mometasone-formoterol  2 puff Inhalation BID    Time spent on care of this patient: 35 mins  Lala Lund K M.D on 12/30/2014 at 10:42 AM  Between 7am to 7pm - Pager - [phone], After 7pm go to [email or web address] - password [place]  [phone]   LOS: 3 days

## 2014-12-30 NOTE — Care Management Important Message (Signed)
Important Message  Patient Details  Name: Erin Vang MRN: [id number] Date of Birth: 17-May-1935   Medicare Important Message Given:  Yes-second notification given    Delorse Lek 12/30/2014, 1:43 PM

## 2014-12-30 NOTE — Progress Notes (Signed)
BSW intern Spoke with pt and pt son about bed offers. The have accepted bed at [facility]. Facility can accept pt when medically stable.   [place] intern  [phone]

## 2014-12-30 NOTE — Progress Notes (Signed)
Ref: GREEN, Viviann Spare, MD   Subjective:  Family and patient prefer cardiac cath as inpatient. Vital signs and Hgb, BUN/Cr are holding stable.  Objective:  Vital Signs in the last 24 hours: Temp:  [97.9 F (36.6 C)-98.8 F (37.1 C)] 97.9 F (36.6 C) (09/08 1413) Pulse Rate:  [70-73] 71 (09/08 1413) Cardiac Rhythm:  [-] Ventricular paced (09/08 0907) Resp:  [18-20] 18 (09/08 1413) BP: (129-154)/(40-61) 129/40 mmHg (09/08 1413) SpO2:  [98 %-100 %] 100 % (09/08 1413) Weight:  [62.46 kg (137 lb 11.2 oz)] 62.46 kg (137 lb 11.2 oz) (09/08 0324)  Physical Exam: BP Readings from Last 1 Encounters:  12/30/14 129/40    Wt Readings from Last 1 Encounters:  12/30/14 62.46 kg (137 lb 11.2 oz)    Weight change: 0.091 kg (3.2 oz)  HEENT: [place]/AT, Eyes- PERL, EOMI, Conjunctiva-Pale pinkink, Sclera-Non-icteric Neck: No JVD, No bruit, Trachea midline. Lungs:  Clear, Bilateral. Cardiac:  Regular rhythm, normal S1 and S2, no S3. III/VI systolic murmur/ Abdomen:  Soft, non-tender. Extremities:  No edema present. No cyanosis. No clubbing. CNS: AxOx3, Cranial nerves grossly intact, moves all 4 extremities. Right handed. Skin: Warm and dry.   Intake/Output from previous day: 09/07 0701 - 09/08 0700 In: 720 [P.O.:720] Out: 1475 [Urine:1475]    Lab Results: BMET    Component Value Date/Time   NA 139 12/30/2014 0349   NA 139 12/29/2014 0346   NA 140 12/28/2014 0346   NA 144 09/01/2014 1445   NA 145* 05/31/2014 1434   NA 140 10/15/2013 1657   K 3.8 12/30/2014 0349   K 3.6 12/29/2014 0346   K 3.8 12/28/2014 0346   CL 108 12/30/2014 0349   CL 105 12/29/2014 0346   CL 110 12/28/2014 0346   CO2 22 12/30/2014 0349   CO2 24 12/29/2014 0346   CO2 22 12/28/2014 0346   GLUCOSE 140* 12/30/2014 0349   GLUCOSE 126* 12/29/2014 0346   GLUCOSE 142* 12/28/2014 0346   GLUCOSE 109* 09/01/2014 1445   GLUCOSE 218* 05/31/2014 1434   GLUCOSE 146* 10/15/2013 1657   BUN 20 12/30/2014 0349   BUN 19  12/29/2014 0346   BUN 28* 12/28/2014 0346   BUN 25 09/01/2014 1445   BUN 19 05/31/2014 1434   BUN 31* 10/15/2013 1657   CREATININE 1.01* 12/30/2014 0349   CREATININE 1.04* 12/29/2014 0346   CREATININE 1.02* 12/28/2014 0346   CALCIUM 8.7* 12/30/2014 0349   CALCIUM 8.6* 12/29/2014 0346   CALCIUM 8.1* 12/28/2014 0346   GFRNONAA 52* 12/30/2014 0349   GFRNONAA 50* 12/29/2014 0346   GFRNONAA 52* 12/28/2014 0346   GFRAA >60 12/30/2014 0349   GFRAA 59* 12/29/2014 0346   GFRAA 60* 12/28/2014 0346   CBC    Component Value Date/Time   WBC 9.0 12/29/2014 0346   WBC 8.7 07/23/2013 1243   RBC 3.77* 12/29/2014 0346   RBC 3.77* 12/29/2014 0346   RBC 4.59 07/23/2013 1243   HGB 11.0* 12/29/2014 0346   HCT 33.7* 12/29/2014 0346   PLT 206 12/29/2014 0346   MCV 89.4 12/29/2014 0346   MCH 29.2 12/29/2014 0346   MCH 29.0 07/23/2013 1243   MCHC 32.6 12/29/2014 0346   MCHC 32.6 07/23/2013 1243   RDW 13.5 12/29/2014 0346   RDW 14.1 07/23/2013 1243   LYMPHSABS 2.1 12/27/2014 0736   LYMPHSABS 1.5 07/23/2013 1243   MONOABS 1.1* 12/27/2014 0736   EOSABS 0.1 12/27/2014 0736   EOSABS 0.1 07/23/2013 1243   BASOSABS 0.1 12/27/2014  0736   BASOSABS 0.1 07/23/2013 1243   HEPATIC Function Panel  Recent Labs  12/27/14 0736 12/28/14 0346 12/29/14 0346  PROT 5.4* 5.1* 5.6*   HEMOGLOBIN A1C No components found for: HGA1C,  MPG CARDIAC ENZYMES Lab Results  Component Value Date   CKTOTAL 999* 12/29/2014   TROPONINI 0.28* 12/27/2014   TROPONINI 0.32* 12/27/2014   TROPONINI 0.49* 12/27/2014   BNP No results for input(s): PROBNP in the last 8760 hours. TSH No results for input(s): TSH in the last 8760 hours. CHOLESTEROL  Recent Labs  05/31/14 1434  CHOL 193    Scheduled Meds: . feeding supplement (GLUCERNA SHAKE)  237 mL Oral Q1500  . ferrous sulfate  325 mg Oral BID WC  . insulin aspart  0-9 Units Subcutaneous TID WC  . linagliptin  5 mg Oral Daily  . mirabegron ER  25 mg Oral Daily   . mometasone-formoterol  2 puff Inhalation BID   Continuous Infusions:  PRN Meds:.acetaminophen **OR** acetaminophen, meclizine, ondansetron **OR** ondansetron (ZOFRAN) IV  Assessment/Plan: Recurrent fall Chronic Atrial fibrillation S/P Pacemaker placement Abnormal cardiac enzymes Possible CAD COPD Hypertension Anemia, etiology unknown Severe MR and TR Hypoalbuminemia  Cardiac cath in AM. Patient and son understood risks, benefits and alternatives.    LOS: 3 days    Dixie Dials  MD  12/30/2014, 5:11 PM

## 2014-12-31 ENCOUNTER — Encounter (HOSPITAL_COMMUNITY)
Admission: EM | Disposition: A | Discharge status: Skilled Nursing Facility | Payer: Self-pay | Source: Home / Self Care | Attending: Internal Medicine

## 2014-12-31 ENCOUNTER — Encounter (HOSPITAL_COMMUNITY): Payer: Self-pay | Admitting: Cardiovascular Disease

## 2014-12-31 DIAGNOSIS — Z7901 Long term (current) use of anticoagulants: Secondary | ICD-10-CM

## 2014-12-31 HISTORY — PX: CARDIAC CATHETERIZATION: SHX172

## 2014-12-31 LAB — GLUCOSE, CAPILLARY
GLUCOSE-CAPILLARY: 113 mg/dL — AB (ref 65–99)
Glucose-Capillary: 114 mg/dL — ABNORMAL HIGH (ref 65–99)
Glucose-Capillary: 146 mg/dL — ABNORMAL HIGH (ref 65–99)

## 2014-12-31 LAB — BASIC METABOLIC PANEL
ANION GAP: 9 (ref 5–15)
BUN: 17 mg/dL (ref 6–20)
CALCIUM: 8.9 mg/dL (ref 8.9–10.3)
CO2: 25 mmol/L (ref 22–32)
Chloride: 106 mmol/L (ref 101–111)
Creatinine, Ser: 0.96 mg/dL (ref 0.44–1.00)
GFR, EST NON AFRICAN AMERICAN: 56 mL/min — AB (ref >60)
Glucose, Bld: 124 mg/dL — ABNORMAL HIGH (ref 65–99)
POTASSIUM: 3.9 mmol/L (ref 3.5–5.1)
SODIUM: 140 mmol/L (ref 135–145)

## 2014-12-31 LAB — CBC
HCT: 35.7 % — ABNORMAL LOW (ref 36.0–46.0)
Hemoglobin: 11.6 g/dL — ABNORMAL LOW (ref 12.0–15.0)
MCH: 29.1 pg (ref 26.0–34.0)
MCHC: 32.5 g/dL (ref 30.0–36.0)
MCV: 89.7 fL (ref 78.0–100.0)
PLATELETS: 238 10*3/uL (ref 150–400)
RBC: 3.98 MIL/uL (ref 3.87–5.11)
RDW: 13.6 % (ref 11.5–15.5)
WBC: 7.6 10*3/uL (ref 4.0–10.5)

## 2014-12-31 LAB — PROTIME-INR
INR: 1.43 (ref 0.00–1.49)
PROTHROMBIN TIME: 17.5 s — AB (ref 11.6–15.2)

## 2014-12-31 SURGERY — LEFT HEART CATH AND CORONARY ANGIOGRAPHY

## 2014-12-31 MED ORDER — SODIUM CHLORIDE 0.9 % IV SOLN
INTRAVENOUS | Status: AC
Start: 2014-12-31 — End: 2014-12-31
  Administered 2014-12-31: 09:00:00 via INTRAVENOUS

## 2014-12-31 MED ORDER — LIDOCAINE HCL (PF) 1 % IJ SOLN
INTRAMUSCULAR | Status: DC | PRN
  Administered 2014-12-31: 12 mL

## 2014-12-31 MED ORDER — LIDOCAINE HCL (PF) 1 % IJ SOLN
INTRAMUSCULAR | Status: AC
  Filled 2014-12-31: qty 30

## 2014-12-31 MED ORDER — SODIUM CHLORIDE 0.9 % IV SOLN: 250.0000 mL | INTRAVENOUS | Status: DC | PRN

## 2014-12-31 MED ORDER — HEPARIN (PORCINE) IN NACL 2-0.9 UNIT/ML-% IJ SOLN
INTRAMUSCULAR | Status: AC
  Filled 2014-12-31: qty 1000

## 2014-12-31 MED ORDER — FENTANYL CITRATE (PF) 100 MCG/2ML IJ SOLN
INTRAMUSCULAR | Status: AC
  Filled 2014-12-31: qty 4

## 2014-12-31 MED ORDER — LIDOCAINE HCL (PF) 1 % IJ SOLN
INTRAMUSCULAR | Status: DC | PRN
  Administered 2014-12-31: 08:00:00

## 2014-12-31 MED ORDER — GLUCERNA SHAKE PO LIQD: 237.0000 mL | Freq: Every day | ORAL | Status: AC

## 2014-12-31 MED ORDER — MIDAZOLAM HCL 2 MG/2ML IJ SOLN
INTRAMUSCULAR | Status: DC | PRN
  Administered 2014-12-31: 1 mg via INTRAVENOUS

## 2014-12-31 MED ORDER — SODIUM CHLORIDE 0.9 % IJ SOLN: 3.0000 mL | INTRAMUSCULAR | Status: DC | PRN

## 2014-12-31 MED ORDER — CARVEDILOL 3.125 MG PO TABS: 3.1250 mg | ORAL_TABLET | Freq: Two times a day (BID) | ORAL | Status: AC

## 2014-12-31 MED ORDER — IOHEXOL 350 MG/ML SOLN
INTRAVENOUS | Status: DC | PRN
  Administered 2014-12-31: 100 mL via INTRAVENOUS

## 2014-12-31 MED ORDER — SODIUM CHLORIDE 0.9 % IJ SOLN: 3.0000 mL | Freq: Two times a day (BID) | INTRAMUSCULAR | Status: DC

## 2014-12-31 MED ORDER — MIDAZOLAM HCL 2 MG/2ML IJ SOLN
INTRAMUSCULAR | Status: AC
  Filled 2014-12-31: qty 4

## 2014-12-31 SURGICAL SUPPLY — 7 items
CATH INFINITI 5FR MULTPACK ANG (CATHETERS) ×3 IMPLANT
KIT HEART LEFT (KITS) ×3 IMPLANT
PACK CARDIAC CATHETERIZATION (CUSTOM PROCEDURE TRAY) ×3 IMPLANT
SHEATH PINNACLE 5F 10CM (SHEATH) ×3 IMPLANT
SYR MEDRAD MARK V 150ML (SYRINGE) ×3 IMPLANT
TRANSDUCER W/STOPCOCK (MISCELLANEOUS) ×3 IMPLANT
WIRE EMERALD 3MM-J .035X150CM (WIRE) ×3 IMPLANT

## 2014-12-31 NOTE — Discharge Instructions (Signed)
Follow with Primary MD GREEN, Viviann Spare, MD in 7 days   Get CBC, CMP, 2 view Chest X ray checked  by Primary MD next visit.    Activity: As tolerated with Full fall precautions use walker/cane & assistance as needed   Disposition SNF   Diet: Heart Healthy Low Carb.  For Heart failure patients - Check your Weight same time everyday, if you gain over 2 pounds, or you develop in leg swelling, experience more shortness of breath or chest pain, call your Primary MD immediately. Follow Cardiac Low Salt Diet and 1.5 lit/day fluid restriction.   On your next visit with your primary care physician please Get Medicines reviewed and adjusted.   Please request your Prim.MD to go over all Hospital Tests and Procedure/Radiological results at the follow up, please get all Hospital records sent to your Prim MD by signing hospital release before you go home.   If you experience worsening of your admission symptoms, develop shortness of breath, life threatening emergency, suicidal or homicidal thoughts you must seek medical attention immediately by calling 911 or calling your MD immediately  if symptoms less severe.  You Must read complete instructions/literature along with all the possible adverse reactions/side effects for all the Medicines you take and that have been prescribed to you. Take any new Medicines after you have completely understood and accpet all the possible adverse reactions/side effects.   Do not drive, operating heavy machinery, perform activities at heights, swimming or participation in water activities or provide baby sitting services if your were admitted for syncope or siezures until you have seen by Primary MD or a Neurologist and advised to do so again.  Do not drive when taking Pain medications.    Do not take more than prescribed Pain, Sleep and Anxiety Medications  Special Instructions: If you have smoked or chewed Tobacco  in the last 2 yrs please stop smoking, stop any  regular Alcohol  and or any Recreational drug use.  Wear Seat belts while driving.   Please note  You were cared for by a hospitalist during your hospital stay. If you have any questions about your discharge medications or the care you received while you were in the hospital after you are discharged, you can call the unit and asked to speak with the hospitalist on call if the hospitalist that took care of you is not available. Once you are discharged, your primary care physician will handle any further medical issues. Please note that NO REFILLS for any discharge medications will be authorized once you are discharged, as it is imperative that you return to your primary care physician (or establish a relationship with a primary care physician if you do not have one) for your aftercare needs so that they can reassess your need for medications and monitor your lab values.

## 2014-12-31 NOTE — Progress Notes (Signed)
OT Cancellation Note  Patient Details Name: Erin Vang MRN: [id number] DOB: Oct 29, 1935   Cancelled Treatment:    Reason Eval/Treat Not Completed: Other (comment);Patient not medically ready. [name] 12/31/2014, 12:20 PM

## 2014-12-31 NOTE — H&P (View-Only) (Signed)
Ref: GREEN, Viviann Spare, MD   Subjective:  Family and patient prefer cardiac cath as inpatient. Vital signs and Hgb, BUN/Cr are holding stable.  Objective:  Vital Signs in the last 24 hours: Temp:  [97.9 F (36.6 C)-98.8 F (37.1 C)] 97.9 F (36.6 C) (09/08 1413) Pulse Rate:  [70-73] 71 (09/08 1413) Cardiac Rhythm:  [-] Ventricular paced (09/08 0907) Resp:  [18-20] 18 (09/08 1413) BP: (129-154)/(40-61) 129/40 mmHg (09/08 1413) SpO2:  [98 %-100 %] 100 % (09/08 1413) Weight:  [62.46 kg (137 lb 11.2 oz)] 62.46 kg (137 lb 11.2 oz) (09/08 0324)  Physical Exam: BP Readings from Last 1 Encounters:  12/30/14 129/40    Wt Readings from Last 1 Encounters:  12/30/14 62.46 kg (137 lb 11.2 oz)    Weight change: 0.091 kg (3.2 oz)  HEENT: [place]/AT, Eyes- PERL, EOMI, Conjunctiva-Pale pinkink, Sclera-Non-icteric Neck: No JVD, No bruit, Trachea midline. Lungs:  Clear, Bilateral. Cardiac:  Regular rhythm, normal S1 and S2, no S3. III/VI systolic murmur/ Abdomen:  Soft, non-tender. Extremities:  No edema present. No cyanosis. No clubbing. CNS: AxOx3, Cranial nerves grossly intact, moves all 4 extremities. Right handed. Skin: Warm and dry.   Intake/Output from previous day: 09/07 0701 - 09/08 0700 In: 720 [P.O.:720] Out: 1475 [Urine:1475]    Lab Results: BMET    Component Value Date/Time   NA 139 12/30/2014 0349   NA 139 12/29/2014 0346   NA 140 12/28/2014 0346   NA 144 09/01/2014 1445   NA 145* 05/31/2014 1434   NA 140 10/15/2013 1657   K 3.8 12/30/2014 0349   K 3.6 12/29/2014 0346   K 3.8 12/28/2014 0346   CL 108 12/30/2014 0349   CL 105 12/29/2014 0346   CL 110 12/28/2014 0346   CO2 22 12/30/2014 0349   CO2 24 12/29/2014 0346   CO2 22 12/28/2014 0346   GLUCOSE 140* 12/30/2014 0349   GLUCOSE 126* 12/29/2014 0346   GLUCOSE 142* 12/28/2014 0346   GLUCOSE 109* 09/01/2014 1445   GLUCOSE 218* 05/31/2014 1434   GLUCOSE 146* 10/15/2013 1657   BUN 20 12/30/2014 0349   BUN 19  12/29/2014 0346   BUN 28* 12/28/2014 0346   BUN 25 09/01/2014 1445   BUN 19 05/31/2014 1434   BUN 31* 10/15/2013 1657   CREATININE 1.01* 12/30/2014 0349   CREATININE 1.04* 12/29/2014 0346   CREATININE 1.02* 12/28/2014 0346   CALCIUM 8.7* 12/30/2014 0349   CALCIUM 8.6* 12/29/2014 0346   CALCIUM 8.1* 12/28/2014 0346   GFRNONAA 52* 12/30/2014 0349   GFRNONAA 50* 12/29/2014 0346   GFRNONAA 52* 12/28/2014 0346   GFRAA >60 12/30/2014 0349   GFRAA 59* 12/29/2014 0346   GFRAA 60* 12/28/2014 0346   CBC    Component Value Date/Time   WBC 9.0 12/29/2014 0346   WBC 8.7 07/23/2013 1243   RBC 3.77* 12/29/2014 0346   RBC 3.77* 12/29/2014 0346   RBC 4.59 07/23/2013 1243   HGB 11.0* 12/29/2014 0346   HCT 33.7* 12/29/2014 0346   PLT 206 12/29/2014 0346   MCV 89.4 12/29/2014 0346   MCH 29.2 12/29/2014 0346   MCH 29.0 07/23/2013 1243   MCHC 32.6 12/29/2014 0346   MCHC 32.6 07/23/2013 1243   RDW 13.5 12/29/2014 0346   RDW 14.1 07/23/2013 1243   LYMPHSABS 2.1 12/27/2014 0736   LYMPHSABS 1.5 07/23/2013 1243   MONOABS 1.1* 12/27/2014 0736   EOSABS 0.1 12/27/2014 0736   EOSABS 0.1 07/23/2013 1243   BASOSABS 0.1 12/27/2014  0736   BASOSABS 0.1 07/23/2013 1243   HEPATIC Function Panel  Recent Labs  12/27/14 0736 12/28/14 0346 12/29/14 0346  PROT 5.4* 5.1* 5.6*   HEMOGLOBIN A1C No components found for: HGA1C,  MPG CARDIAC ENZYMES Lab Results  Component Value Date   CKTOTAL 999* 12/29/2014   TROPONINI 0.28* 12/27/2014   TROPONINI 0.32* 12/27/2014   TROPONINI 0.49* 12/27/2014   BNP No results for input(s): PROBNP in the last 8760 hours. TSH No results for input(s): TSH in the last 8760 hours. CHOLESTEROL  Recent Labs  05/31/14 1434  CHOL 193    Scheduled Meds: . feeding supplement (GLUCERNA SHAKE)  237 mL Oral Q1500  . ferrous sulfate  325 mg Oral BID WC  . insulin aspart  0-9 Units Subcutaneous TID WC  . linagliptin  5 mg Oral Daily  . mirabegron ER  25 mg Oral Daily   . mometasone-formoterol  2 puff Inhalation BID   Continuous Infusions:  PRN Meds:.acetaminophen **OR** acetaminophen, meclizine, ondansetron **OR** ondansetron (ZOFRAN) IV  Assessment/Plan: Recurrent fall Chronic Atrial fibrillation S/P Pacemaker placement Abnormal cardiac enzymes Possible CAD COPD Hypertension Anemia, etiology unknown Severe MR and TR Hypoalbuminemia  Cardiac cath in AM. Patient and son understood risks, benefits and alternatives.    LOS: 3 days    Dixie Dials  MD  12/30/2014, 5:11 PM

## 2014-12-31 NOTE — Interval H&P Note (Signed)
History and Physical Interval Note:  12/31/2014 7:36 AM  Rande Lawman  has presented today for surgery, with the diagnosis of shortness of breath  The various methods of treatment have been discussed with the patient and family. After consideration of risks, benefits and other options for treatment, the patient has consented to  Procedure(s): Left Heart Cath and Coronary Angiography (N/A) as a surgical intervention .  The patient's history has been reviewed, patient examined, no change in status, stable for surgery.  I have reviewed the patient's chart and labs.  Questions were answered to the patient's satisfaction.     Jovonna Nickell S

## 2014-12-31 NOTE — Discharge Summary (Addendum)
Erin Vang, is a 79 y.o. female  DOB 1935-06-07  MRN [id number].  Admission date:  12/26/2014  Admitting Physician  Rise Patience, MD  Discharge Date:  12/31/2014   Primary MD  Estill Dooms, MD  Recommendations for primary care physician for things to follow:   Monitor blood pressure, CBC, CMP and glycemic control closely   Admission Diagnosis  Elevated troponin [R79.89] Non-traumatic rhabdomyolysis [M62.82]   Discharge Diagnosis  Elevated troponin [R79.89] Non-traumatic rhabdomyolysis [M62.82]     Principal Problem:   Rhabdomyolysis Active Problems:   Diabetes type 2, controlled   Essential hypertension   COPD (chronic obstructive pulmonary disease)   Chronic anticoagulation with Xarelto   Chronic atrial fibrillation   Fall   Non-traumatic rhabdomyolysis      Past Medical History  Diagnosis Date  . Hypertension   . Osteoarthritis   . Hypercholesterolemia   . Paroxysmal atrial fibrillation     CHADS2: 2; managed with rate control and anticoagulation; s/p AVN ablation 04/2013  . Syncope and collapse     When patient stands up real quickly  . Normal cardiac stress test     a.  lexiscan MV - EF 72%, No ischemia  . Anemia     ? of lab error vs GI bleed  . COPD (chronic obstructive pulmonary disease)   . Chronic anticoagulation     On Xarelto  . Exertional dyspnea   . Type II diabetes mellitus   . History of blood transfusion     "wasn't needed; had some bad lab results" (03/17/12)  . Depression   . Prolonged grief reaction     "husband died 1 yr ago" (03-17-12)  . Urinary incontinence   . Benign paroxysmal positional vertigo 07/22/2012  . Tinnitus of left ear 07/22/2012  . Unspecified constipation   . Insomnia, unspecified   . Anxiety state, unspecified     Past Surgical History    Procedure Laterality Date  . Appendectomy  1952  . Cervical disc surgery  04/2009  . Hip arthroplasty  11/08/2011    Procedure: ARTHROPLASTY BIPOLAR HIP;  Surgeon: Mauri Pole, MD;  Location: [place] [place];  Service: Orthopedics;  Laterality: Left;  . Insert / replace / remove pacemaker  2012/03/17  . Tubal ligation  1974  . Cholecystectomy  1984  . Polypectomy  11/21/1999    Endometrial polyps  . Eye surgery Right 03/2006    Cataract surgery  . Eye surgery  11/2004    Lid surgery  . Ablation  05/22/2013    AVN ablation by Dr Caryl Comes  . Permanent pacemaker insertion N/A 03-17-12    Procedure: PERMANENT PACEMAKER INSERTION;  Surgeon: Deboraha Sprang, MD;  Location: [facility] CATH LAB;  Service: Cardiovascular;  Laterality: N/A;  . Av node ablation N/A 05/22/2013    Procedure: AV NODE ABLATION;  Surgeon: Deboraha Sprang, MD;  Location: [facility] C[facility] LAB;  Service: Cardiovascular;  Laterality: N/A;       HPI  from the history and physical  done on the day of admission:    79 y.o. female with a history of chronic atrial fibrillation/tachybradycardia syndrome status post AV nodal ablation and pacemaker placement, hypertension, hyperlipidemia and diabetes mellitus was brought to the ER after she had 2 falls within 24 hours. As per patient's daughter patient had a fall while walking in the street. Later in the evening patient again had a fall and was brought to the ER. CT of the head did not show anything acute. Labs revealed rhabdomyolysis with mildly elevated troponin. Patient has history of gait instability and had recently been started on medications for dizziness and urinary incontinence.      Hospital Course:     Fall with mild rhabdomyolysis CK slowly improving with hydration has stabilized.  Acute renal failure Due to dehydration + rhabdo - crt slow to improve - as all with hydration.  Diabetes mellitus type 2 controlled CBG currently reasonably controlled - continue carb modified diet and home regimen  upon discharge.  Elevated troponin No chest pain, stress test positive for anterior wall reversible moderate ischemia. Per cardiology she underwent left heart catheterization on 12/31/2014 which showed no signs of significant coronary obstruction see report for all details, discussed her case with Dr. Doylene Canard cardiologist who cleared him for SNF discharge with outpatient follow-up with him. He did not recommend any medication changes at this time. On [place] added for blood pressure.  Hypertension BP mildly elevated will add low-dose Coreg.  COPD  Well compensated at this time - follow clinically   Memory deficit - gait instability  Pt tells me she lives alone - I suspect it is no longer safe for her to do so due to what appears to be progressive dementia - stable B12 and folate -  RPR - PT/OT evaluation done and qualifies for SNF. Will request PCP to do outpatient dementia workup as appropriate.   Chronic atrial fibrillation status post ablation/pacemaker chads 2 vasc score more than 2 - was on Xarelto but with multiple recent falls and request her primary cardiologist to consider long-term use of anticoagulation. For now per PT being discharged to SNF. Needs close monitoring and fall precautions.   Bilateral elbow and knee skin tears from fall tetanus booster - x-ray pelvis is negative for fracture - pt denies focal pain , continue local wound care.  Isolated AST elevation ?EtOH abuse - pt denies - no signs of withdrawal here, please repeat CMP in 1-2 weeks       Discharge Condition: Fair  Follow UP  Follow-up Information    Follow up with GREEN, Viviann Spare, MD. Schedule an appointment as soon as possible for a visit in 1 week.   Specialty:  Internal Medicine   Contact information:   [place] [address] [phone]       Follow up with [facility] S, MD. Schedule an appointment as soon as possible for a visit in 1 week.   Specialty:  Cardiology     Why:  CAD   Contact information:   [address] [place] [address] [phone]        Consults obtained - cardiology  Diet and Activity recommendation: See Discharge Instructions below  Discharge Instructions         Discharge Instructions    Discharge instructions    Complete by:  As directed   Follow with Primary MD GREEN, Viviann Spare, MD in 7 days   Get CBC, CMP, 2 view Chest X  ray checked  by Primary MD next visit.    Activity: As tolerated with Full fall precautions use walker/cane & assistance as needed   Disposition SNF   Diet: Heart Healthy Low Carb.  For Heart failure patients - Check your Weight same time everyday, if you gain over 2 pounds, or you develop in leg swelling, experience more shortness of breath or chest pain, call your Primary MD immediately. Follow Cardiac Low Salt Diet and 1.5 lit/day fluid restriction.   On your next visit with your primary care physician please Get Medicines reviewed and adjusted.   Please request your Prim.MD to go over all Hospital Tests and Procedure/Radiological results at the follow up, please get all Hospital records sent to your Prim MD by signing hospital release before you go home.   If you experience worsening of your admission symptoms, develop shortness of breath, life threatening emergency, suicidal or homicidal thoughts you must seek medical attention immediately by calling 911 or calling your MD immediately  if symptoms less severe.  You Must read complete instructions/literature along with all the possible adverse reactions/side effects for all the Medicines you take and that have been prescribed to you. Take any new Medicines after you have completely understood and accpet all the possible adverse reactions/side effects.   Do not drive, operating heavy machinery, perform activities at heights, swimming or participation in water activities or provide baby sitting services if your were admitted for  syncope or siezures until you have seen by Primary MD or a Neurologist and advised to do so again.  Do not drive when taking Pain medications.    Do not take more than prescribed Pain, Sleep and Anxiety Medications  Special Instructions: If you have smoked or chewed Tobacco  in the last 2 yrs please stop smoking, stop any regular Alcohol  and or any Recreational drug use.  Wear Seat belts while driving.   Please note  You were cared for by a hospitalist during your hospital stay. If you have any questions about your discharge medications or the care you received while you were in the hospital after you are discharged, you can call the unit and asked to speak with the hospitalist on call if the hospitalist that took care of you is not available. Once you are discharged, your primary care physician will handle any further medical issues. Please note that NO REFILLS for any discharge medications will be authorized once you are discharged, as it is imperative that you return to your primary care physician (or establish a relationship with a primary care physician if you do not have one) for your aftercare needs so that they can reassess your need for medications and monitor your lab values.     Increase activity slowly    Complete by:  As directed              Discharge Medications       Medication List    TAKE these medications        carvedilol 3.125 MG tablet  Commonly known as:  COREG  Take 1 tablet (3.125 mg total) by mouth 2 (two) times daily with a meal.     CENTRUM SILVER PO  Take 1 tablet by mouth daily.     feeding supplement (GLUCERNA SHAKE) Liqd  Take 237 mLs by mouth daily at 3 pm.     FLAX SEED OIL PO  Take 1,400 mg by mouth daily.     freestyle lancets  Freestyle Lite,  Check blood sugar two times daily E11.9     FREESTYLE LITE Devi  Check blood sugar two times daily E11.9     glucose blood test strip  Freestyle Lite, check blood sugar twice daily E11.9      Linagliptin-Metformin HCl 2.08-998 MG Tabs  One each morning to control diabetes     meclizine 25 MG tablet  Commonly known as:  ANTIVERT  Take one tablet by mouth every 6 hours as needed for dizziness     mirabegron ER 25 MG Tb24 tablet  Commonly known as:  MYRBETRIQ  One daily to help bladder control     rivaroxaban 20 MG Tabs tablet  Commonly known as:  XARELTO  Take 20 mg by mouth daily with supper.     VITAMIN D PO  Take 1,000 Units by mouth daily.        Major procedures and Radiology Reports - PLEASE review detailed and final reports for all details, in brief -       Dg Chest 2 View  12/27/2014   CLINICAL DATA:  79 year old female status post fall.  EXAM: CHEST  2 VIEW  COMPARISON:  Radiograph dated 11/12/2013  FINDINGS: There are emphysematous changes of the lungs. No focal consolidation, pleural effusion, or pneumothorax. Stable cardiomegaly. Left pectoral pacemaker device. The osseous structures are grossly unremarkable.  IMPRESSION: No active cardiopulmonary disease.   Electronically Signed   By: Anner Crete M.D.   On: 12/27/2014 01:32   Ct Head Wo Contrast  12/26/2014   CLINICAL DATA:  79 year old female status post fall.  EXAM: CT HEAD WITHOUT CONTRAST  TECHNIQUE: Contiguous axial images were obtained from the base of the skull through the vertex without intravenous contrast.  COMPARISON:  CT dated 11/12/2013  FINDINGS: The ventricles are dilated and the sulci are prominent compatible with age-related atrophy. Periventricular and deep white matter hypodensities represent chronic microvascular ischemic changes. There is no intracranial hemorrhage. No mass effect or midline shift identified.  The visualized paranasal sinuses and mastoid air cells are well aerated. The calvarium is intact.  IMPRESSION: No acute intracranial pathology.  Age-related atrophy and chronic microvascular ischemic disease.  If symptoms persist and there are no contraindications, MRI may provide  better evaluation if clinically indicated.   Electronically Signed   By: Anner Crete M.D.   On: 12/26/2014 23:11   Dg Pelvis Portable  12/27/2014   CLINICAL DATA:  [place] from a standing position yesterday. Clinical concern for a hip fracture.  EXAM: PORTABLE PELVIS 1-2 VIEWS  COMPARISON:  11/08/2011.  FINDINGS: The left hip prosthesis is unchanged. No fracture or dislocation seen. Lower lumbar spine degenerative changes.  IMPRESSION: No fracture or dislocation. Lower lumbar spine degenerative changes.   Electronically Signed   By: Claudie Revering M.D.   On: 12/27/2014 08:50   Nm Myocar Multi W/spect W/wall Motion / Ef  12/28/2014   CLINICAL DATA:  Syncope, diabetes, hypertension, dyspnea on exertion.  EXAM: MYOCARDIAL IMAGING WITH SPECT (REST AND PHARMACOLOGIC-STRESS)  GATED LEFT VENTRICULAR WALL MOTION STUDY  LEFT VENTRICULAR EJECTION FRACTION  TECHNIQUE: Standard myocardial SPECT imaging was performed after resting intravenous injection of 10.0 mCi Tc-81m sestamibi. Subsequently, intravenous infusion of Lexiscan was performed under the supervision of the Cardiology staff. At peak effect of the drug, 30 mCi Tc-42m sestamibi was injected intravenously and standard myocardial SPECT imaging was performed. Quantitative gated imaging was also performed to evaluate left ventricular wall motion, and estimate left ventricular ejection fraction.  COMPARISON:  None.  FINDINGS: Perfusion: There is a moderate area of mildly decreased activity in the anterolateral wall of the left ventricle on the stress study, relatively improved on resting. There is a small area of mildly decreased activity in the inferoapical region which is fixed on both the resting and stress study.  Wall Motion: Septal hypokinesis.  No left ventricular dilatation.  Left Ventricular Ejection Fraction: 44 %  End diastolic volume 85 ml  End systolic volume 48 ml  IMPRESSION: 1. Suspect moderate region of mild anterolateral ischemia.  Critical  Value/emergent results were called by telephone at the time of interpretation on 12/28/2014 at 8:00 pm to Dr. Dixie Dials , who verbally acknowledged these results.  2. Mild septal hypokinesis without LV dilatation.  3. Left ventricular ejection fraction 44%  4. Intermediate-risk stress test findings*.  *2012 Appropriate Use Criteria for Coronary Revascularization Focused Update: J Am Coll Cardiol. 1610;96(0):454-098. http://content.airportbarriers.com.aspx?articleid=1201161   Electronically Signed   By: Lucrezia Europe M.D.   On: 12/28/2014 20:00    Micro Results      No results found for this or any previous visit (from the past 240 hour(s)).     Today   Subjective    Erin Vang today has no headache,no chest abdominal pain,no new weakness tingling or numbness, feels much better.   Objective   Blood pressure 158/82, pulse 76, temperature 97.6 F (36.4 C), temperature source Oral, resp. rate 14, height 5\' 5"  (1.651 m), weight 61.598 kg (135 lb 12.8 oz), SpO2 96 %.   Intake/Output Summary (Last 24 hours) at 12/31/14 1009 Last data filed at 12/31/14 0900  Gross per 24 hour  Intake    590 ml  Output   2250 ml  Net  -1660 ml    Exam Awake Alert, Oriented x 2, No new F.N deficits, Normal affect [place].AT,PERRAL Supple Neck,No JVD, No cervical lymphadenopathy appriciated.  Symmetrical Chest wall movement, Good air movement bilaterally, CTAB RRR,No Gallops,Rubs or new Murmurs, No Parasternal Heave +ve B.Sounds, Abd Soft, Non tender, No organomegaly appriciated, No rebound -guarding or rigidity. No Cyanosis, Clubbing or edema, No new Rash or bruise   Data Review   CBC w Diff:  Lab Results  Component Value Date   WBC 7.6 12/31/2014   WBC 8.7 07/23/2013   HGB 11.6* 12/31/2014   HCT 35.7* 12/31/2014   PLT 238 12/31/2014   LYMPHOPCT 21 12/27/2014   MONOPCT 11 12/27/2014   EOSPCT 1 12/27/2014   BASOPCT 1 12/27/2014    CMP:  Lab Results  Component Value Date   NA 140  12/31/2014   NA 144 09/01/2014   K 3.9 12/31/2014   CL 106 12/31/2014   CO2 25 12/31/2014   BUN 17 12/31/2014   BUN 25 09/01/2014   CREATININE 0.96 12/31/2014   PROT 5.6* 12/29/2014   PROT 6.6 05/31/2014   ALBUMIN 3.0* 12/29/2014   BILITOT 0.7 12/29/2014   BILITOT 0.3 05/31/2014   ALKPHOS 87 12/29/2014   AST 98* 12/29/2014   ALT 67* 12/29/2014  .   Total Time in preparing paper work, data evaluation and todays exam - 35 minutes  Thurnell Lose M.D on 12/31/2014 at 10:09 AM  [facility]   Office  [phone]

## 2014-12-31 NOTE — Progress Notes (Signed)
Site area: right groin a 5 french arterial sheath was removed  Site Prior to Removal:  Level 0  Pressure Applied For 15 MINUTES    Minutes Beginning at 0820a  Manual:   Yes.    Patient Status During Pull:  stable  Post Pull Groin Site:  Level 0  Post Pull Instructions Given:  Yes.    Post Pull Pulses Present:  Yes.    Dressing Applied:  Yes.    Comments:  VS remain stable during sheath pull.  Pt resting with eyes closed.  No distress noted.

## 2014-12-31 NOTE — Progress Notes (Signed)
Report called to [place], RN @ [facility] [place], [place]

## 2014-12-31 NOTE — Progress Notes (Signed)
PT Cancellation Note  Patient Details Name: Erin Vang MRN: [id number] DOB: [birth year]   Cancelled Treatment:    Reason Eval/Treat Not Completed: Patient at procedure or test/unavailable   [name] [name] 12/31/2014, 9:14 AM [name], [place]

## 2015-01-03 ENCOUNTER — Non-Acute Institutional Stay (SKILLED_NURSING_FACILITY): Payer: Medicare Other | Admitting: Adult Health

## 2015-01-03 ENCOUNTER — Encounter: Payer: Self-pay | Admitting: Adult Health

## 2015-01-03 DIAGNOSIS — R42 Dizziness and giddiness: Secondary | ICD-10-CM | POA: Diagnosis not present

## 2015-01-03 DIAGNOSIS — E119 Type 2 diabetes mellitus without complications: Secondary | ICD-10-CM | POA: Diagnosis not present

## 2015-01-03 DIAGNOSIS — R2681 Unsteadiness on feet: Secondary | ICD-10-CM

## 2015-01-03 DIAGNOSIS — N3281 Overactive bladder: Secondary | ICD-10-CM | POA: Diagnosis not present

## 2015-01-03 DIAGNOSIS — I10 Essential (primary) hypertension: Secondary | ICD-10-CM | POA: Diagnosis not present

## 2015-01-03 DIAGNOSIS — E46 Unspecified protein-calorie malnutrition: Secondary | ICD-10-CM | POA: Diagnosis not present

## 2015-01-03 DIAGNOSIS — J449 Chronic obstructive pulmonary disease, unspecified: Secondary | ICD-10-CM | POA: Diagnosis not present

## 2015-01-03 DIAGNOSIS — I482 Chronic atrial fibrillation, unspecified: Secondary | ICD-10-CM

## 2015-01-04 ENCOUNTER — Encounter: Payer: Medicare Other | Admitting: *Deleted

## 2015-01-04 ENCOUNTER — Telehealth: Payer: Self-pay | Admitting: Cardiology

## 2015-01-04 NOTE — Telephone Encounter (Signed)
LMOVM reminding pt to send remote transmission.   

## 2015-01-05 ENCOUNTER — Non-Acute Institutional Stay (SKILLED_NURSING_FACILITY): Payer: Medicare Other | Admitting: Internal Medicine

## 2015-01-05 ENCOUNTER — Telehealth: Payer: Self-pay | Admitting: *Deleted

## 2015-01-05 ENCOUNTER — Encounter: Payer: Self-pay | Admitting: Cardiology

## 2015-01-05 DIAGNOSIS — R2681 Unsteadiness on feet: Secondary | ICD-10-CM | POA: Diagnosis not present

## 2015-01-05 DIAGNOSIS — D649 Anemia, unspecified: Secondary | ICD-10-CM | POA: Diagnosis not present

## 2015-01-05 DIAGNOSIS — E119 Type 2 diabetes mellitus without complications: Secondary | ICD-10-CM

## 2015-01-05 DIAGNOSIS — N3941 Urge incontinence: Secondary | ICD-10-CM | POA: Diagnosis not present

## 2015-01-05 DIAGNOSIS — G309 Alzheimer's disease, unspecified: Secondary | ICD-10-CM

## 2015-01-05 DIAGNOSIS — H8110 Benign paroxysmal vertigo, unspecified ear: Secondary | ICD-10-CM

## 2015-01-05 DIAGNOSIS — I482 Chronic atrial fibrillation, unspecified: Secondary | ICD-10-CM

## 2015-01-05 DIAGNOSIS — R5381 Other malaise: Secondary | ICD-10-CM | POA: Diagnosis not present

## 2015-01-05 DIAGNOSIS — E46 Unspecified protein-calorie malnutrition: Secondary | ICD-10-CM | POA: Diagnosis not present

## 2015-01-05 DIAGNOSIS — F0280 Dementia in other diseases classified elsewhere without behavioral disturbance: Secondary | ICD-10-CM

## 2015-01-05 NOTE — Telephone Encounter (Signed)
Called [employer] [phone] and obtained Prior Authorization for Myrbetriq. Spoke with Elmyra Ricks and APPROVED from 01/05/15 - [employer]. Case #:[id number] [facility] [address] Notified-Robin

## 2015-01-05 NOTE — Progress Notes (Signed)
[name], female   DOB: [birth year], 79 y.o.   MRN: [id number]     [facility]   PCP: GREEN, Viviann Spare, MD  Code Status: full code  Allergies  Allergen Reactions  . Erythromycin Hives  . Penicillins Hives and Rash  . Sulfa Antibiotics Hives  . Sulfonamide Derivatives Hives and Rash  . Wool Alcohol [Lanolin Alcohol] Hives  . Neomycin-Bacitracin Zn-Polymyx Rash  . Nitrofurantoin Other (See Comments)    Unknown   . Nitrofurantoin Monohyd Macro Other (See Comments)    Unknown     Chief Complaint  Patient presents with  . New Admit To SNF     HPI:  79 y.o. patient is here for short term rehabilitation post hospital admission from 12/26/14-12/31/14 post fal. Ct head ruled out acute finding. Labs revealed rhabdomylosis and mildly elevated troponin with acute renal failure. She responded well to hydration. She was seen by cardiology, had cardiac catheterization on 12/31/14 showing no significant coronary obstruction. She has medical history of chronic afib s/p AV nodal ablation and pacemaker, HTN, HLD, DM. She is seen in her room today. She has had 2 falls in the facility with no apparent injury per staff. She is alert and oriented to person and gets up from wheelchair unassisted during interview. She understands that she is not supposed to do this. She denies any concerns to me. She remembers having 2 falls in the facility and these were mechanical fall with her trying to transfer herself unassisted.   Review of Systems:  Constitutional: Negative for fever, chills, diaphoresis.  HENT: Negative for headache, congestion, nasal discharge.   Eyes: Negative for eye pain, blurred vision, double vision and discharge.  Respiratory: Negative for cough, shortness of breath and wheezing.   Cardiovascular: Negative for chest pain, palpitations, leg swelling.  Gastrointestinal: Negative for heartburn, nausea, vomiting, abdominal pain Genitourinary:  Negative for dysuria, flank pain.  Musculoskeletal: Negative for back pain, falls Skin: Negative for itching, rash.  Neurological: Negative for dizziness, tingling, focal weakness Psychiatric/Behavioral: Negative for depression. Has memory loss.    Past Medical History  Diagnosis Date  . Hypertension   . Osteoarthritis   . Hypercholesterolemia   . Paroxysmal atrial fibrillation     CHADS2: 2; managed with rate control and anticoagulation; s/p AVN ablation 04/2013  . Syncope and collapse     When patient stands up real quickly  . Normal cardiac stress test     a.  lexiscan MV - EF 72%, No ischemia  . Anemia     ? of lab error vs GI bleed  . COPD (chronic obstructive pulmonary disease)   . Chronic anticoagulation     On Xarelto  . Exertional dyspnea   . Type II diabetes mellitus   . History of blood transfusion     "wasn't needed; had some bad lab results" (11-Mar-2012)  . Depression   . Prolonged grief reaction     "husband died 1 yr ago" (03/11/12)  . Urinary incontinence   . Benign paroxysmal positional vertigo 07/22/2012  . Tinnitus of left ear 07/22/2012  . Unspecified constipation   . Insomnia, unspecified   . Anxiety state, unspecified    Past Surgical History  Procedure Laterality Date  . Appendectomy  1952  . Cervical disc surgery  04/2009  . Hip arthroplasty  11/08/2011    Procedure: ARTHROPLASTY BIPOLAR HIP;  Surgeon: Mauri Pole, MD;  Location: [place];  Service: Orthopedics;  Laterality:  Left;  . Insert / replace / remove pacemaker  02/29/2012  . Tubal ligation  1974  . Cholecystectomy  1984  . Polypectomy  11/21/1999    Endometrial polyps  . Eye surgery Right 03/2006    Cataract surgery  . Eye surgery  11/2004    Lid surgery  . Ablation  05/22/2013    AVN ablation by Dr Caryl Comes  . Permanent pacemaker insertion N/A 02/29/2012    Procedure: PERMANENT PACEMAKER INSERTION;  Surgeon: Deboraha Sprang, MD;  Location: [facility] CATH LAB;  Service: Cardiovascular;  Laterality:  N/A;  . Av node ablation N/A 05/22/2013    Procedure: AV NODE ABLATION;  Surgeon: Deboraha Sprang, MD;  Location: [facility] CATH LAB;  Service: Cardiovascular;  Laterality: N/A;  . Cardiac catheterization N/A 12/31/2014    Procedure: Left Heart Cath and Coronary Angiography;  Surgeon: Dixie Dials, MD;  Location: [place] CV LAB;  Service: Cardiovascular;  Laterality: N/A;   Social History:   reports that she quit smoking about 28 years ago. Her smoking use included Cigarettes. She has a 70 pack-year smoking history. She has never used smokeless tobacco. She reports that she does not drink alcohol or use illicit drugs.  Family History  Problem Relation Age of Onset  . Arthritis Mother   . Kidney nephrosis Mother   . Ulcers Mother     Peptic  . Heart failure Mother     CHF  . Cancer Father     Kidney  . Early death Son     Self-inflicted gunshot  . Heart attack Neg Hx     Medications:   Medication List       This list is accurate as of: 01/05/15 10:18 AM.  Always use your most recent med list.               carvedilol 3.125 MG tablet  Commonly known as:  COREG  Take 1 tablet (3.125 mg total) by mouth 2 (two) times daily with a meal.     CENTRUM SILVER PO  Take 1 tablet by mouth daily.     feeding supplement (GLUCERNA SHAKE) Liqd  Take 237 mLs by mouth daily at 3 pm.     FLAX SEED OIL PO  Take 1,400 mg by mouth daily.     freestyle lancets  Freestyle Lite, Check blood sugar two times daily E11.9     FREESTYLE LITE Devi  Check blood sugar two times daily E11.9     glucose blood test strip  Freestyle Lite, check blood sugar twice daily E11.9     Linagliptin-Metformin HCl 2.08-998 MG Tabs  One each morning to control diabetes     meclizine 25 MG tablet  Commonly known as:  ANTIVERT  Take one tablet by mouth every 6 hours as needed for dizziness     mirabegron ER 25 MG Tb24 tablet  Commonly known as:  MYRBETRIQ  One daily to help bladder control     rivaroxaban 20  MG Tabs tablet  Commonly known as:  XARELTO  Take 20 mg by mouth daily with supper.     VITAMIN D PO  Take 1,000 Units by mouth daily.         Physical Exam: Filed Vitals:   01/05/15 1017  BP: 123/70  Pulse: 72  Temp: 97.7 F (36.5 C)  Resp: 16  Weight: 138 lb 3.2 oz (62.687 kg)  SpO2: 99%    General- elderly female, well built, in no acute distress  Head- normocephalic, atraumatic Nose- normal nasal mucosa, no maxillary or frontal sinus tenderness, no nasal discharge Throat- moist mucus membrane Eyes- PERRLA, EOMI, no pallor, no icterus, no discharge, normal conjunctiva, normal sclera Neck- no cervical lymphadenopathy Cardiovascular- normal s1,s2, no murmurs, palpable dorsalis pedis, trace leg edema Respiratory- bilateral clear to auscultation, no wheeze, no rhonchi, no crackles, no use of accessory muscles Abdomen- bowel sounds present, soft, non tender Musculoskeletal- able to move all 4 extremities, generalized weakness and unsteady gait Neurological- no focal deficit, alert and oriented to person Skin- warm and dry, skin tear in both arms and right knee Psychiatry- normal mood and affect    Labs reviewed: Basic Metabolic Panel:  Recent Labs  12/29/14 0346 12/30/14 0349 12/31/14 0550  NA 139 139 140  K 3.6 3.8 3.9  CL 105 108 106  CO2 24 22 25   GLUCOSE 126* 140* 124*  BUN 19 20 17   CREATININE 1.04* 1.01* 0.96  CALCIUM 8.6* 8.7* 8.9   Liver Function Tests:  Recent Labs  12/27/14 0736 12/28/14 0346 12/29/14 0346  AST 93* 85* 98*  ALT 41 44 67*  ALKPHOS 63 65 87  BILITOT 0.9 1.0 0.7  PROT 5.4* 5.1* 5.6*  ALBUMIN 2.9* 2.8* 3.0*   No results for input(s): LIPASE, AMYLASE in the last 8760 hours.  Recent Labs  12/29/14 0346  AMMONIA 44*   CBC:  Recent Labs  12/26/14 2037 12/27/14 0736 12/28/14 0346 12/29/14 0346 12/31/14 0550  WBC 15.2* 9.8 8.6 9.0 7.6  NEUTROABS 12.6* 6.5  --   --   --   HGB 13.7 11.7* 10.9* 11.0* 11.6*  HCT 40.4  34.8* 33.6* 33.7* 35.7*  MCV 87.8 89.2 89.8 89.4 89.7  PLT 267 198 185 206 238   Cardiac Enzymes:  Recent Labs  12/27/14 0736 12/27/14 1310 12/27/14 1814 12/28/14 0346 12/29/14 0346  CKTOTAL 1860*  --   --  1151* 999*  TROPONINI 0.49* 0.32* 0.28*  --   --    BNP: Invalid input(s): POCBNP CBG:  Recent Labs  12/31/14 0552 12/31/14 0824 12/31/14 1113  GLUCAP 113* 114* 146*    Radiological Exams: Dg Chest 2 View  12/27/2014   CLINICAL DATA:  79 year old female status post fall.  EXAM: CHEST  2 VIEW  COMPARISON:  Radiograph dated 11/12/2013  FINDINGS: There are emphysematous changes of the lungs. No focal consolidation, pleural effusion, or pneumothorax. Stable cardiomegaly. Left pectoral pacemaker device. The osseous structures are grossly unremarkable.  IMPRESSION: No active cardiopulmonary disease.   Electronically Signed   By: Anner Crete M.D.   On: 12/27/2014 01:32   Ct Head Wo Contrast  12/26/2014   CLINICAL DATA:  79 year old female status post fall.  EXAM: CT HEAD WITHOUT CONTRAST  TECHNIQUE: Contiguous axial images were obtained from the base of the skull through the vertex without intravenous contrast.  COMPARISON:  CT dated 11/12/2013  FINDINGS: The ventricles are dilated and the sulci are prominent compatible with age-related atrophy. Periventricular and deep white matter hypodensities represent chronic microvascular ischemic changes. There is no intracranial hemorrhage. No mass effect or midline shift identified.  The visualized paranasal sinuses and mastoid air cells are well aerated. The calvarium is intact.  IMPRESSION: No acute intracranial pathology.  Age-related atrophy and chronic microvascular ischemic disease.  If symptoms persist and there are no contraindications, MRI may provide better evaluation if clinically indicated.   Electronically Signed   By: Anner Crete M.D.   On: 12/26/2014 23:11   Dg Pelvis Portable  12/27/2014   CLINICAL DATA:  [place] from a  standing position yesterday. Clinical concern for a hip fracture.  EXAM: PORTABLE PELVIS 1-2 VIEWS  COMPARISON:  11/08/2011.  FINDINGS: The left hip prosthesis is unchanged. No fracture or dislocation seen. Lower lumbar spine degenerative changes.  IMPRESSION: No fracture or dislocation. Lower lumbar spine degenerative changes.   Electronically Signed   By: Claudie Revering M.D.   On: 12/27/2014 08:50    Assessment/Plan  Physical deconditioning Will have patient work with PT/OT as tolerated to regain strength and restore function.  Fall precautions are in place.  Unsteady gait Needs gait training, assistance with ADLs. High fall risk. Will need frequent redirection with her dementia. Medication changes as below  Protein calorie malnutrition Monitor weight, continue mighty shakes for now  Anemia Monitor h&h  UI Currently on myrbetriq er 25 mg daily, with her frequent fall and this medication having side effect of causing dizziness, d/c and monitor clinically  Dm Lab Results  Component Value Date   HGBA1C 6.5* 09/01/2014  currently on linagliptin-metformin 2.08-998 mg daily. Recheck a1c with goal of 7. Monitor cbg daily  Chronic afib Rate controlled. Continue coreg 3.125 mg bid for rate control and xarelto 20 mg daily for anticoagulation.  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

## 2015-01-14 ENCOUNTER — Ambulatory Visit (INDEPENDENT_AMBULATORY_CARE_PROVIDER_SITE_OTHER): Payer: Medicare Other | Admitting: *Deleted

## 2015-01-14 DIAGNOSIS — I495 Sick sinus syndrome: Secondary | ICD-10-CM | POA: Diagnosis not present

## 2015-01-15 ENCOUNTER — Encounter: Payer: Self-pay | Admitting: Internal Medicine

## 2015-01-15 DIAGNOSIS — I495 Sick sinus syndrome: Secondary | ICD-10-CM

## 2015-01-20 NOTE — Progress Notes (Signed)
Remote pacemaker transmission.   

## 2015-01-31 ENCOUNTER — Encounter: Payer: Self-pay | Admitting: Adult Health

## 2015-01-31 ENCOUNTER — Non-Acute Institutional Stay (SKILLED_NURSING_FACILITY): Payer: Medicare Other | Admitting: Adult Health

## 2015-01-31 DIAGNOSIS — S92901S Unspecified fracture of right foot, sequela: Secondary | ICD-10-CM

## 2015-01-31 DIAGNOSIS — I10 Essential (primary) hypertension: Secondary | ICD-10-CM

## 2015-01-31 DIAGNOSIS — E119 Type 2 diabetes mellitus without complications: Secondary | ICD-10-CM | POA: Diagnosis not present

## 2015-01-31 DIAGNOSIS — F0280 Dementia in other diseases classified elsewhere without behavioral disturbance: Secondary | ICD-10-CM | POA: Diagnosis not present

## 2015-01-31 DIAGNOSIS — J449 Chronic obstructive pulmonary disease, unspecified: Secondary | ICD-10-CM

## 2015-01-31 DIAGNOSIS — R270 Ataxia, unspecified: Secondary | ICD-10-CM

## 2015-01-31 DIAGNOSIS — R2681 Unsteadiness on feet: Secondary | ICD-10-CM | POA: Diagnosis not present

## 2015-01-31 DIAGNOSIS — I482 Chronic atrial fibrillation, unspecified: Secondary | ICD-10-CM

## 2015-01-31 DIAGNOSIS — G309 Alzheimer's disease, unspecified: Secondary | ICD-10-CM | POA: Diagnosis not present

## 2015-01-31 DIAGNOSIS — R42 Dizziness and giddiness: Secondary | ICD-10-CM

## 2015-02-01 LAB — CUP PACEART REMOTE DEVICE CHECK
Brady Statistic RV Percent Paced: 98 %
Lead Channel Impedance Value: 0 Ohm
Lead Channel Impedance Value: 564 Ohm
Lead Channel Pacing Threshold Amplitude: 0.625 V
Lead Channel Pacing Threshold Pulse Width: 0.4 ms
Lead Channel Setting Sensing Sensitivity: 4 mV
MDC IDC MSMT BATTERY IMPEDANCE: 696 Ohm
MDC IDC MSMT BATTERY REMAINING LONGEVITY: 67 mo
MDC IDC MSMT BATTERY VOLTAGE: 2.78 V
MDC IDC SESS DTM: 20160924181941
MDC IDC SET LEADCHNL RV PACING AMPLITUDE: 2.5 V
MDC IDC SET LEADCHNL RV PACING PULSEWIDTH: 0.4 ms

## 2015-02-01 NOTE — Progress Notes (Signed)
[name], female   DOB: 09-Dec-1935, 79 y.o.   MRN: [id number]    DATE:  01/03/15  MRN:  [id number]  BIRTHDAY: [birth year]  Facility:  [facility]ursing Home Location:  [place] Room Number: 509-P  LEVEL OF CARE:  SNF [phone])  Contact Information    Name Relation Home Work [place] Son [phone]  [phone]   Reynolds,Debbie Daughter [phone]        Chief Complaint  Patient presents with  . Hospitalization Follow-up    Unsteady gait, diabetes mellitus, hypertension, COPD, chronic atrial fibrillation, vertigo, overactive bladder and protein calorie malnutrition    HISTORY OF PRESENT ILLNESS:  This is a 79 year old female who has been admitted to [facility] on 12/31/14 from [facility] she has PMH of chronic atrial fibrillation/tachybradycardia syndrome S/P A-V nodal ablation and pacemaker placement, hypertension, hyperlipidemia and diabetes mellitus. She had 2 falls within 24 hours and her daughter brought patient to ED. Labs revealed rhabdomyolysis/mildly elevated troponin.  She has been admitted for a short-term rehabilitation.   PAST MEDICAL HISTORY:  Past Medical History  Diagnosis Date  . Hypertension   . Osteoarthritis   . Hypercholesterolemia   . Paroxysmal atrial fibrillation (HCC)     CHADS2: 2; managed with rate control and anticoagulation; s/p AVN ablation 04/2013  . Syncope and collapse     When patient stands up real quickly  . Normal cardiac stress test     a.  lexiscan MV - EF 72%, No ischemia  . Anemia     ? of lab error vs GI bleed  . COPD (chronic obstructive pulmonary disease) ([place])   . Chronic anticoagulation     On Xarelto  . Exertional dyspnea   . Type II diabetes mellitus ([place])   . History of blood transfusion     "wasn't needed; had some bad lab results" (2012-03-18)  . Depression   . Prolonged grief reaction     "husband died 1 yr ago" (March 18, 2012)  . Urinary incontinence     . Benign paroxysmal positional vertigo 07/22/2012  . Tinnitus of left ear 07/22/2012  . Unspecified constipation   . Insomnia, unspecified   . Anxiety state, unspecified      CURRENT MEDICATIONS: Reviewed  Patient's Medications  New Prescriptions   No medications on file  Previous Medications   BLOOD GLUCOSE MONITORING SUPPL (FREESTYLE LITE) DEVI    Check blood sugar two times daily E11.9   CARVEDILOL (COREG) 3.125 MG TABLET    Take 1 tablet (3.125 mg total) by mouth 2 (two) times daily with a meal.   CHOLECALCIFEROL (VITAMIN D PO)    Take 1,000 Units by mouth daily.    FEEDING SUPPLEMENT, GLUCERNA SHAKE, (GLUCERNA SHAKE) LIQD    Take 237 mLs by mouth daily at 3 pm.   FLAXSEED, LINSEED, (FLAX SEED OIL PO)    Take 1,400 mg by mouth daily.    GLUCOSE BLOOD TEST STRIP    Freestyle Lite, check blood sugar twice daily E11.9   LANCETS (FREESTYLE) LANCETS    Freestyle Lite, Check blood sugar two times daily E11.9   LINAGLIPTIN-METFORMIN HCL 2.08-998 MG TABS    One each morning to control diabetes   MECLIZINE (ANTIVERT) 25 MG TABLET    Take one tablet by mouth every 6 hours as needed for dizziness   MIRABEGRON ER (MYRBETRIQ) 25 MG TB24 TABLET    One daily to help bladder control  MULTIPLE VITAMINS-MINERALS (CENTRUM SILVER PO)    Take 1 tablet by mouth daily.    RIVAROXABAN (XARELTO) 20 MG TABS TABLET    Take 20 mg by mouth daily with supper.  Modified Medications   No medications on file  Discontinued Medications   No medications on file     Allergies  Allergen Reactions  . Erythromycin Hives  . Penicillins Hives and Rash  . Sulfa Antibiotics Hives  . Sulfonamide Derivatives Hives and Rash  . Wool Alcohol [Lanolin Alcohol] Hives  . Neomycin-Bacitracin Zn-Polymyx Rash  . Nitrofurantoin Other (See Comments)    Unknown   . Nitrofurantoin Monohyd Macro Other (See Comments)    Unknown      REVIEW OF SYSTEMS:  GENERAL: no change in appetite, no fatigue, no weight changes, no fever,  chills or weakness EYES: Denies change in vision, dry eyes, eye pain, itching or discharge EARS: Denies change in hearing, ringing in ears, or earache NOSE: Denies nasal congestion or epistaxis MOUTH and THROAT: Denies oral discomfort, gingival pain or bleeding, pain from teeth or hoarseness   RESPIRATORY: no cough, SOB, DOE, wheezing, hemoptysis CARDIAC: no chest pain, edema or palpitations GI: no abdominal pain, diarrhea, constipation, heart burn, nausea or vomiting GU: Denies dysuria, frequency, hematuria, incontinence, or discharge PSYCHIATRIC: Denies feeling of depression or anxiety. No report of hallucinations, insomnia, paranoia, or agitation   PHYSICAL EXAMINATION  GENERAL APPEARANCE: Well nourished. In no acute distress. Normal body habitus SKIN:  Bilateral elbow abrasion, briusing on left upper arm HEAD: Normal in size and contour. No evidence of trauma EYES: Lids open and close normally. No blepharitis, entropion or ectropion. PERRL. Conjunctivae are clear and sclerae are white. Lenses are without opacity EARS: Pinnae are normal. Patient hears normal voice tunes of the examiner MOUTH and THROAT: Lips are without lesions. Oral mucosa is moist and without lesions. Tongue is normal in shape, size, and color and without lesions NECK: supple, trachea midline, no neck masses, no thyroid tenderness, no thyromegaly LYMPHATICS: no LAN in the neck, no supraclavicular LAN RESPIRATORY: breathing is even & unlabored, BS CTAB CARDIAC: RRR, no murmur,no extra heart sounds, no edema, left chest pacemaker GI: abdomen soft, normal BS, no masses, no tenderness, no hepatomegaly, no splenomegaly PSYCHIATRIC: Alert and oriented X 3. Affect and behavior are appropriate  LABS/RADIOLOGY: Labs reviewed: Basic Metabolic Panel:  Recent Labs  12/29/14 0346 12/30/14 0349 12/31/14 0550  NA 139 139 140  K 3.6 3.8 3.9  CL 105 108 106  CO2 24 22 25   GLUCOSE 126* 140* 124*  BUN 19 20 17   CREATININE  1.04* 1.01* 0.96  CALCIUM 8.6* 8.7* 8.9   Liver Function Tests:  Recent Labs  12/27/14 0736 12/28/14 0346 12/29/14 0346  AST 93* 85* 98*  ALT 41 44 67*  ALKPHOS 63 65 87  BILITOT 0.9 1.0 0.7  PROT 5.4* 5.1* 5.6*  ALBUMIN 2.9* 2.8* 3.0*    Recent Labs  12/29/14 0346  AMMONIA 44*   CBC:  Recent Labs  12/26/14 2037 12/27/14 0736 12/28/14 0346 12/29/14 0346 12/31/14 0550  WBC 15.2* 9.8 8.6 9.0 7.6  NEUTROABS 12.6* 6.5  --   --   --   HGB 13.7 11.7* 10.9* 11.0* 11.6*  HCT 40.4 34.8* 33.6* 33.7* 35.7*  MCV 87.8 89.2 89.8 89.4 89.7  PLT 267 198 185 206 238   Lipid Panel:  Recent Labs  05/31/14 1434  HDL 55   Cardiac Enzymes:  Recent Labs  12/27/14 0736 12/27/14 1310  12/27/14 1814 12/28/14 0346 12/29/14 0346  CKTOTAL 1860*  --   --  1151* 999*  TROPONINI 0.49* 0.32* 0.28*  --   --    CBG:  Recent Labs  12/31/14 0552 12/31/14 0824 12/31/14 1113  GLUCAP 113* 114* 146*    ASSESSMENT/PLAN:  Unsteady gait - for rehabilitation  Diabetes mellitus, type II - hemoglobin A1c 6.5; continue Linagliptin-Metformin 12.08-998 mg one tab by mouth every morning  Hypertension - well controlled; continue Coreg 3.125 mg by mouth twice a day; check CMP  Chronic atrial fibrillation - S/P ablation/ace maker; rate controlled; continue Xarelto 20 mg 1 tab by mouth daily; follow-up with Dr. Doylene Canard, cardiology, in 1 week; check CBC  Vertigo - continue Antivert 25 mg 1 tab by mouth every 6 hours when necessary  Overactive bladder - continue Myrbetriq ER 25 mg 1 tab by mouth daily  Protein calorie malnutrition - albumin 3.0; start Procel one scoop by mouth twice a day  COPD - well compensated   Goals of care:  Short-term rehabilitation    [facility], NP [facility] [phone]

## 2015-02-02 NOTE — Progress Notes (Addendum)
[name], female   DOB: 29-Jan-1936, 79 y.o.   MRN: [id number]    DATE:  01/31/15  MRN:  [id number]  BIRTHDAY: 1935-12-11  Facility:  [facility]ursing Home Location:  [place] Room Number: 509-P  LEVEL OF CARE:  SNF [phone])  Contact Information    Name Relation Home Work [place] Son [phone]  [phone]   Reynolds,Debbie Daughter [phone]        Chief Complaint  Patient presents with  . Discharge Note    Unsteady gait/ataxia, diabetes mellitus type 2, hypertension, COPD, Alzheimer's disease, chronic atrial fibrillation, vertigo and right foot fracture    HISTORY OF PRESENT ILLNESS:  This is a 79 year old female who is for discharge to ALF. DME: Lightweight wheelchair, anti-tippers, anti-roll backs, elevating leg rests and wheelchair cushion. Unable to propel standard wheelchair due to lack of upper body strength. She has been admitted to [facility] on 12/31/14 from [facility] At [facility] she has PMH of chronic atrial fibrillation/tachybradycardia syndrome S/P A-V nodal ablation and pacemaker placement, hypertension, hyperlipidemia and diabetes mellitus. She had 2 falls within 24 hours and her daughter brought patient to ED. Labs revealed rhabdomyolysis/mildly elevated troponin.  Patient was admitted to this facility for short-term rehabilitation after the patient's recent hospitalization.  Patient has completed SNF rehabilitation and therapy has cleared the patient for discharge.   PAST MEDICAL HISTORY:  Past Medical History  Diagnosis Date  . Hypertension   . Osteoarthritis   . Hypercholesterolemia   . Paroxysmal atrial fibrillation (HCC)     CHADS2: 2; managed with rate control and anticoagulation; s/p AVN ablation 04/2013  . Syncope and collapse     When patient stands up real quickly  . Normal cardiac stress test     a.  lexiscan MV - EF 72%, No ischemia  . Anemia     ? of lab error vs GI bleed  . COPD  (chronic obstructive pulmonary disease) ([place])   . Chronic anticoagulation     On Xarelto  . Exertional dyspnea   . Type II diabetes mellitus ([place])   . History of blood transfusion     "wasn't needed; had some bad lab results" (2012/03/26)  . Depression   . Prolonged grief reaction     "husband died 1 yr ago" (2012/03/26)  . Urinary incontinence   . Benign paroxysmal positional vertigo 07/22/2012  . Tinnitus of left ear 07/22/2012  . Unspecified constipation   . Insomnia, unspecified   . Anxiety state, unspecified      CURRENT MEDICATIONS: Reviewed  Patient's Medications  New Prescriptions   No medications on file  Previous Medications   BLOOD GLUCOSE MONITORING SUPPL (FREESTYLE LITE) DEVI    Check blood sugar two times daily E11.9   CARVEDILOL (COREG) 3.125 MG TABLET    Take 1 tablet (3.125 mg total) by mouth 2 (two) times daily with a meal.   CHOLECALCIFEROL (VITAMIN D PO)    Take 1,000 Units by mouth daily.    FEEDING SUPPLEMENT, GLUCERNA SHAKE, (GLUCERNA SHAKE) LIQD    Take 237 mLs by mouth daily at 3 pm.   FLAXSEED, LINSEED, (FLAX SEED OIL PO)    Take 1,400 mg by mouth daily.    GLUCOSE BLOOD TEST STRIP    Freestyle Lite, check blood sugar twice daily E11.9   LANCETS (FREESTYLE) LANCETS    Freestyle Lite, Check blood sugar two times daily E11.9   LINAGLIPTIN-METFORMIN HCL 2.08-998 MG  TABS    One each morning to control diabetes   MECLIZINE (ANTIVERT) 25 MG TABLET    Take one tablet by mouth every 6 hours as needed for dizziness   MIRABEGRON ER (MYRBETRIQ) 25 MG TB24 TABLET    One daily to help bladder control   MULTIPLE VITAMINS-MINERALS (CENTRUM SILVER PO)    Take 1 tablet by mouth daily.    RIVAROXABAN (XARELTO) 20 MG TABS TABLET    Take 20 mg by mouth daily with supper.  Modified Medications   No medications on file  Discontinued Medications   No medications on file     Allergies  Allergen Reactions  . Erythromycin Hives  . Penicillins Hives and Rash  . Sulfa  Antibiotics Hives  . Sulfonamide Derivatives Hives and Rash  . Wool Alcohol [Lanolin Alcohol] Hives  . Neomycin-Bacitracin Zn-Polymyx Rash  . Nitrofurantoin Other (See Comments)    Unknown   . Nitrofurantoin Monohyd Macro Other (See Comments)    Unknown      REVIEW OF SYSTEMS:  GENERAL: no change in appetite, no fatigue, no weight changes, no fever, chills or weakness EYES: Denies change in vision, dry eyes, eye pain, itching or discharge EARS: Denies change in hearing, ringing in ears, or earache NOSE: Denies nasal congestion or epistaxis MOUTH and THROAT: Denies oral discomfort, gingival pain or bleeding, pain from teeth or hoarseness   RESPIRATORY: no cough, SOB, DOE, wheezing, hemoptysis CARDIAC: no chest pain, edema or palpitations GI: no abdominal pain, diarrhea, constipation, heart burn, nausea or vomiting GU: Denies dysuria, frequency, hematuria, incontinence, or discharge PSYCHIATRIC: Denies feeling of depression or anxiety. No report of hallucinations, insomnia, paranoia, or agitation   PHYSICAL EXAMINATION  GENERAL APPEARANCE: Well nourished. In no acute distress. Normal body habitus HEAD: Normal in size and contour. No evidence of trauma EYES: Lids open and close normally. No blepharitis, entropion or ectropion. PERRL. Conjunctivae are clear and sclerae are white. Lenses are without opacity EARS: Pinnae are normal. Patient hears normal voice tunes of the examiner MOUTH and THROAT: Lips are without lesions. Oral mucosa is moist and without lesions. Tongue is normal in shape, size, and color and without lesions NECK: supple, trachea midline, no neck masses, no thyroid tenderness, no thyromegaly LYMPHATICS: no LAN in the neck, no supraclavicular LAN RESPIRATORY: breathing is even & unlabored, BS CTAB CARDIAC: RRR, no murmur,no extra heart sounds, no edema, left chest pacemaker GI: abdomen soft, normal BS, no masses, no tenderness, no hepatomegaly, no splenomegaly NEURO:   +ataxia EXTREMITIES:  Able to move X 4 extremities PSYCHIATRIC: Alert and oriented X 3. Affect and behavior are appropriate  LABS/RADIOLOGY: Labs reviewed: 01/26/15  x-ray of right foot shows calcaneal spur. 8 anemia oblique radiolucent line is seen in the mid shaft the second metatarsal. This is seen in the AP view. This is likely percent nondisplaced fracture. No evidence of dislocation osseous lesion. Mild osteopenia. The joint spaces appear assertive. The tarsal bones are well aligned. Soft tissues are unremarkable:  Acute nondisplaced fracture base of the proximal phalanx second toe. 01/13/15  WBC 9.1 hemoglobin 12.8 hematocrit 40.9 MCV 93.0 platelet 278 uric acid 7.7 01/06/15  hemoglobin A1c 6.3 01/04/15  WBC 9.0 hemoglobin 12.9 hematocrit 41.2 MCV 92.6 platelet 296 sodium 141 potassium 4.6 glucose 182 BUN 24 creatinine 1.07 calcium 9.5 protein total 6.6  albumin 3.85 globulin 2.8 alkaline phosphatase 87 SGOT 22 SGPT 26  Basic Metabolic Panel:  Recent Labs  12/29/14 0346 12/30/14 0349 12/31/14 0550  NA 139  139 140  K 3.6 3.8 3.9  CL 105 108 106  CO2 24 22 25   GLUCOSE 126* 140* 124*  BUN 19 20 17   CREATININE 1.04* 1.01* 0.96  CALCIUM 8.6* 8.7* 8.9   Liver Function Tests:  Recent Labs  12/27/14 0736 12/28/14 0346 12/29/14 0346  AST 93* 85* 98*  ALT 41 44 67*  ALKPHOS 63 65 87  BILITOT 0.9 1.0 0.7  PROT 5.4* 5.1* 5.6*  ALBUMIN 2.9* 2.8* 3.0*    Recent Labs  12/29/14 0346  AMMONIA 44*   CBC:  Recent Labs  12/26/14 2037 12/27/14 0736 12/28/14 0346 12/29/14 0346 12/31/14 0550  WBC 15.2* 9.8 8.6 9.0 7.6  NEUTROABS 12.6* 6.5  --   --   --   HGB 13.7 11.7* 10.9* 11.0* 11.6*  HCT 40.4 34.8* 33.6* 33.7* 35.7*  MCV 87.8 89.2 89.8 89.4 89.7  PLT 267 198 185 206 238   Lipid Panel:  Recent Labs  05/31/14 1434  HDL 55   Cardiac Enzymes:  Recent Labs  12/27/14 0736 12/27/14 1310 12/27/14 1814 12/28/14 0346 12/29/14 0346  CKTOTAL 1860*  --   --  1151*  999*  TROPONINI 0.49* 0.32* 0.28*  --   --    CBG:  Recent Labs  12/31/14 0552 12/31/14 0824 12/31/14 1113  GLUCAP 113* 114* 146*    ASSESSMENT/PLAN:  Unsteady gait - due to Ataxia; follow-up consult with Neurology  Diabetes mellitus, type II - hemoglobin A1c 6.5; continue Linagliptin-Metformin 12.08-998 mg one tab by mouth every morning  Hypertension - well controlled; continue Coreg 3.125 mg by mouth twice a day; check CMP  Chronic atrial fibrillation - S/P ablation/ace maker; rate controlled; continue Xarelto 20 mg 1 tab by mouth daily; follow-up with Dr. Doylene Canard, cardiology, in 1 week; check CBC  Vertigo - continue Antivert 25 mg 1 tab by mouth every 6 hours when necessary   COPD - well compensated  Right Foot fracture - continue hard-soled shoe and follow-up with orthopedics  Alzheimer's Disease - continue Aricept 5 mg PO Q HS    I have filled out patient's discharge paperwork and written prescriptions.    DME provided:  Lightweight wheelchair, anti-tippers, anti-roll backs, elevating leg rests and wheelchair cushion. Unable to propel standard wheelchair due to lack of upper body strength.  Total discharge time: Greater than 30 minutes  Discharge time involved coordination of the discharge process with social worker, nursing staff and therapy department. Medical justification for DME verified.     [facility], NP [employer] [phone]

## 2015-02-09 ENCOUNTER — Encounter (HOSPITAL_BASED_OUTPATIENT_CLINIC_OR_DEPARTMENT_OTHER): Payer: Medicare Other | Attending: Surgery

## 2015-02-18 ENCOUNTER — Telehealth: Payer: Self-pay | Admitting: Cardiology

## 2015-02-18 NOTE — Telephone Encounter (Signed)
New Message  Pt son wanted to inform our office (Dr Mare Ferrari and Dr Caryl Comes) that she would be following up in [place], [place].- pt son did not have name of MD

## 2015-02-18 NOTE — Telephone Encounter (Signed)
Will forward to  Dr. Mare Ferrari and Dr Caryl Comes

## 2015-02-19 NOTE — Telephone Encounter (Signed)
Okay, thanks for the update!

## 2015-02-21 ENCOUNTER — Other Ambulatory Visit: Payer: Medicare Other

## 2015-02-23 ENCOUNTER — Ambulatory Visit: Payer: Medicare Other | Admitting: Internal Medicine

## 2015-02-23 ENCOUNTER — Encounter: Payer: Self-pay | Admitting: Internal Medicine

## 2015-03-02 ENCOUNTER — Encounter: Payer: Self-pay | Admitting: Cardiology

## 2015-03-06 NOTE — Clinical Social Work Placement (Signed)
   CLINICAL SOCIAL WORK PLACEMENT  NOTE  Date:  12/31/2014 Patient Details  Name: Erin Vang MRN: [id number] Date of Birth: 03/27/1936  Clinical Social Work is seeking post-discharge placement for this patient at the [place] level of care (*CSW will initial, date and re-position this form in  chart as items are completed):  Yes   Patient/family provided with [place] Work Department's list of facilities offering this level of care within the geographic area requested by the patient (or if unable, by the patient's family).  Yes   Patient/family informed of their freedom to choose among providers that offer the needed level of care, that participate in Medicare, Medicaid or managed care program needed by the patient, have an available bed and are willing to accept the patient.  Yes   Patient/family informed of [facility]'s ownership interest in [facility] and [facility], as well as of the fact that they are under no obligation to receive care at these facilities.  PASRR submitted to EDS on 12/29/14     PASRR number received on 12/29/14     Existing PASRR number confirmed on       FL2 transmitted to all facilities in geographic area requested by pt/family on 12/29/14     FL2 transmitted to all facilities within larger geographic area on       Patient informed that his/her managed care company has contracts with or will negotiate with certain facilities, including the following:        Yes   Patient/family informed of bed offers received.  Patient chooses bed at [facility]     Physician recommends and patient chooses bed at      Patient to be transferred to [facility] on 12/31/14.  Patient to be transferred to facility by Ambulance [facility])     Patient family notified on 12/31/15 of transfer.  Name of family member notified:  Daughter Lonzo Candy     PHYSICIAN Please sign DNR, Please prepare prescriptions, Please sign FL2,  Please prepare priority discharge summary, including medications     Additional Comment: Ok per MD for d/c today to SNF for short term rehab.  OK per patient and daughter [name]- they are very pleased with bed offer and choice.  Nursing notified to cal report; EMS arrangements completed and d/c summary sent to facility. No further CSW needs identified. CSW signing off.  Lorie Phenix. Murrell Redden [phone]      _______________________________________________ Kendell Bane T, LCSW 12/31/2014 3:22 PM

## 2015-03-22 ENCOUNTER — Encounter: Payer: Self-pay | Admitting: Cardiology

## 2015-03-23 ENCOUNTER — Ambulatory Visit: Payer: Medicare Other | Admitting: Cardiology

## 2015-03-24 DEATH — deceased

## 2015-03-28 ENCOUNTER — Telehealth: Payer: Self-pay | Admitting: Internal Medicine

## 2015-03-28 NOTE — Telephone Encounter (Signed)
New message      Calling to let us know pt died on 2015-03-31.  She received a letter stating she is due to have a device remote transmission.

## 2015-03-28 NOTE — Telephone Encounter (Signed)
Pt marked expired in [employer] and unenrolled from [employer].
